# Patient Record
Sex: Female | Born: 1985 | Race: Black or African American | Hispanic: No | Marital: Single | Smoking: Never smoker
Health system: Southern US, Community
[De-identification: ages and names within clinical notes are randomized; demographics above are authoritative.]

## PROBLEM LIST (undated history)

## (undated) DIAGNOSIS — Z8614 Personal history of Methicillin resistant Staphylococcus aureus infection: Secondary | ICD-10-CM

## (undated) DIAGNOSIS — D649 Anemia, unspecified: Secondary | ICD-10-CM

## (undated) DIAGNOSIS — T4145XA Adverse effect of unspecified anesthetic, initial encounter: Secondary | ICD-10-CM

## (undated) DIAGNOSIS — I10 Essential (primary) hypertension: Secondary | ICD-10-CM

## (undated) DIAGNOSIS — T8859XA Other complications of anesthesia, initial encounter: Secondary | ICD-10-CM

## (undated) HISTORY — PX: TONSILLECTOMY: SUR1361

## (undated) HISTORY — DX: Morbid (severe) obesity due to excess calories: E66.01

---
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
## 2004-05-10 ENCOUNTER — Other Ambulatory Visit: Admission: RE | Admit: 2004-05-10 | Discharge: 2004-05-10 | Payer: Self-pay | Admitting: Obstetrics and Gynecology

## 2004-05-11 ENCOUNTER — Other Ambulatory Visit: Admission: RE | Admit: 2004-05-11 | Discharge: 2004-05-11 | Payer: Self-pay | Admitting: Obstetrics and Gynecology

## 2005-10-07 ENCOUNTER — Other Ambulatory Visit: Admission: RE | Admit: 2005-10-07 | Discharge: 2005-10-07 | Payer: Self-pay | Admitting: Obstetrics and Gynecology

## 2006-12-06 ENCOUNTER — Emergency Department (HOSPITAL_COMMUNITY): Admission: EM | Admit: 2006-12-06 | Discharge: 2006-12-06 | Payer: Self-pay | Admitting: Family Medicine

## 2007-03-01 ENCOUNTER — Emergency Department (HOSPITAL_COMMUNITY): Admission: EM | Admit: 2007-03-01 | Discharge: 2007-03-01 | Payer: Self-pay | Admitting: Emergency Medicine

## 2008-03-12 ENCOUNTER — Emergency Department (HOSPITAL_COMMUNITY): Admission: EM | Admit: 2008-03-12 | Discharge: 2008-03-12 | Payer: Self-pay | Admitting: Family Medicine

## 2008-05-21 ENCOUNTER — Emergency Department (HOSPITAL_COMMUNITY): Admission: EM | Admit: 2008-05-21 | Discharge: 2008-05-21 | Payer: Self-pay | Admitting: Family Medicine

## 2008-05-23 ENCOUNTER — Emergency Department (HOSPITAL_COMMUNITY): Admission: EM | Admit: 2008-05-23 | Discharge: 2008-05-23 | Payer: Self-pay | Admitting: Family Medicine

## 2008-05-25 ENCOUNTER — Emergency Department (HOSPITAL_COMMUNITY): Admission: EM | Admit: 2008-05-25 | Discharge: 2008-05-25 | Payer: Self-pay | Admitting: Emergency Medicine

## 2008-06-16 ENCOUNTER — Emergency Department (HOSPITAL_COMMUNITY): Admission: EM | Admit: 2008-06-16 | Discharge: 2008-06-16 | Payer: Self-pay | Admitting: Family Medicine

## 2008-11-29 ENCOUNTER — Emergency Department (HOSPITAL_COMMUNITY): Admission: EM | Admit: 2008-11-29 | Discharge: 2008-11-29 | Payer: Self-pay | Admitting: Emergency Medicine

## 2008-12-01 ENCOUNTER — Emergency Department (HOSPITAL_COMMUNITY): Admission: EM | Admit: 2008-12-01 | Discharge: 2008-12-02 | Payer: Self-pay | Admitting: Emergency Medicine

## 2008-12-24 ENCOUNTER — Emergency Department (HOSPITAL_BASED_OUTPATIENT_CLINIC_OR_DEPARTMENT_OTHER): Admission: EM | Admit: 2008-12-24 | Discharge: 2008-12-24 | Payer: Self-pay | Admitting: Emergency Medicine

## 2010-12-29 LAB — DIFFERENTIAL
Eosinophils Absolute: 0.1 10*3/uL (ref 0.0–0.7)
Lymphocytes Relative: 28 % (ref 12–46)
Lymphs Abs: 2.3 10*3/uL (ref 0.7–4.0)
Neutro Abs: 5 10*3/uL (ref 1.7–7.7)
Neutrophils Relative %: 61 % (ref 43–77)

## 2010-12-29 LAB — BASIC METABOLIC PANEL
BUN: 9 mg/dL (ref 6–23)
Calcium: 8.6 mg/dL (ref 8.4–10.5)
Creatinine, Ser: 0.7 mg/dL (ref 0.4–1.2)
GFR calc non Af Amer: >60 mL/min (ref >60)

## 2010-12-29 LAB — URINALYSIS, ROUTINE W REFLEX MICROSCOPIC
Ketones, ur: NEGATIVE mg/dL
Nitrite: NEGATIVE
Protein, ur: NEGATIVE mg/dL

## 2010-12-29 LAB — CBC
Platelets: 237 10*3/uL (ref 150–400)
WBC: 8.2 10*3/uL (ref 4.0–10.5)

## 2010-12-29 LAB — PREGNANCY, URINE: Preg Test, Ur: NEGATIVE

## 2010-12-30 LAB — GONOCOCCUS CULTURE: Culture: NO GROWTH

## 2010-12-30 LAB — MONONUCLEOSIS SCREEN: Mono Screen: NEGATIVE

## 2011-06-16 LAB — WET PREP, GENITAL: Yeast Wet Prep HPF POC: NONE SEEN

## 2011-06-16 LAB — GC/CHLAMYDIA PROBE AMP, GENITAL: GC Probe Amp, Genital: NEGATIVE

## 2011-06-16 LAB — HIV ANTIBODY (ROUTINE TESTING W REFLEX): HIV: NONREACTIVE

## 2011-06-16 LAB — HERPES SIMPLEX VIRUS CULTURE

## 2011-06-22 LAB — CULTURE, ROUTINE-ABSCESS

## 2012-05-04 ENCOUNTER — Inpatient Hospital Stay (HOSPITAL_COMMUNITY)
Admission: AD | Admit: 2012-05-04 | Discharge: 2012-05-04 | Disposition: A | Discharge status: Home / Self Care | Payer: Medicaid Other | Source: Ambulatory Visit | Attending: Obstetrics & Gynecology | Admitting: Obstetrics & Gynecology

## 2012-05-04 ENCOUNTER — Encounter (HOSPITAL_COMMUNITY): Payer: Self-pay | Admitting: *Deleted

## 2012-05-04 DIAGNOSIS — N912 Amenorrhea, unspecified: Secondary | ICD-10-CM | POA: Insufficient documentation

## 2012-05-04 DIAGNOSIS — I10 Essential (primary) hypertension: Secondary | ICD-10-CM

## 2012-05-04 HISTORY — DX: Anemia, unspecified: D64.9

## 2012-05-04 HISTORY — DX: Adverse effect of unspecified anesthetic, initial encounter: T41.45XA

## 2012-05-04 HISTORY — DX: Other complications of anesthesia, initial encounter: T88.59XA

## 2012-05-04 LAB — WET PREP, GENITAL: WBC, Wet Prep HPF POC: NONE SEEN

## 2012-05-04 LAB — POCT PREGNANCY, URINE: Preg Test, Ur: NEGATIVE

## 2012-05-04 LAB — URINALYSIS, ROUTINE W REFLEX MICROSCOPIC
Bilirubin Urine: NEGATIVE
Glucose, UA: NEGATIVE mg/dL
Hgb urine dipstick: NEGATIVE
Protein, ur: NEGATIVE mg/dL
Urobilinogen, UA: 0.2 mg/dL (ref 0.0–1.0)
pH: 6 (ref 5.0–8.0)

## 2012-05-04 LAB — URINE MICROSCOPIC-ADD ON

## 2012-05-04 MED ORDER — KETOROLAC TROMETHAMINE 60 MG/2ML IM SOLN
60.0000 mg | Freq: Once | INTRAMUSCULAR | Status: AC
  Administered 2012-05-04: 60 mg via INTRAMUSCULAR
  Filled 2012-05-04: qty 2

## 2012-05-04 NOTE — Progress Notes (Signed)
Rectal pain is a throbbing pain with a "squeeze and then a sharp pain"

## 2012-05-04 NOTE — Progress Notes (Signed)
Pt states pain feels like a severe cramp and pt states she has pressure in her vagina

## 2012-05-04 NOTE — MAU Note (Signed)
Pt LMP 08/10/2012, feeling fetal movement.  Never had a pregnancy test.  Pt feels she is 40.[redacted]wks pregnant.  No prenatal care.

## 2012-05-04 NOTE — MAU Note (Signed)
Pt states she has some pain, pt  States she high tolerance for pain, and "according to her App" she is 4 days over due. Pt states she feels movement in her abdomen that other people can also feel.

## 2012-05-04 NOTE — Progress Notes (Signed)
Pt states she is having problem seeing at night

## 2012-05-04 NOTE — MAU Provider Note (Signed)
Chief Complaint:  Possible Pregnancy    First Provider Initiated Contact with Patient 05/04/12 0058      Ashlee Fox is  26 y.o. [id number].  Patient's last menstrual period was 08/11/2011..  She presents complaining of Possible Pregnancy  Pt presents for confirmation of pregnancy. Reports no period since 08/11/2011, "movement" in abd and vaginal and rectal pressure. States that she put information into app on phone that said she was "over her due date".  Obstetrical/Gynecological History: OB History    Grav Para Term Preterm Abortions TAB SAB Ect Mult Living   1 0 0 0 1 0 1 0 0 0       Past Medical History: Past Medical History  Diagnosis Date  . Complication of anesthesia   . Anemia     Past Surgical History: Past Surgical History  Procedure Date  . Tonsillectomy     Family History: No family history on file.  Social History: History  Substance Use Topics  . Smoking status: Not on file  . Smokeless tobacco: Not on file  . Alcohol Use: No    Allergies:  Allergies  Allergen Reactions  . Amoxicillin     Pt states her "breathing is cut off.    No prescriptions prior to admission    Review of Systems - History obtained from the patient General ROS: positive for  - weight gain negative for - chills, fever or weight loss Breast ROS: negative Respiratory ROS: no cough, shortness of breath, or wheezing Cardiovascular ROS: no chest pain or dyspnea on exertion Gastrointestinal ROS: positive for - abdominal pain and change in stools negative for - constipation, diarrhea or nausea/vomiting Genito-Urinary ROS: positive for - change in menstrual cycle and genital discharge, pelvic pain negative for - change in urinary stream, urinary frequency/urgency or vulvar/vaginal symptoms  Physical Exam   Blood pressure 140/83, pulse 92, temperature 97.8 F (36.6 C), temperature source Oral, resp. rate 18, height 5\' 10"  (1.778 m), weight 395 lb 6.4 oz (179.352 kg), last  menstrual period 08/11/2011.  General: General appearance - alert, well appearing, and in no distress, oriented to person, place, and time and comfortable appearing, morbidly obese Mental status - alert, oriented to person, place, and time, normal mood, behavior, speech, dress, motor activity, and thought processes Neck - supple, no significant adenopathy, acanthosis nigricans Lymphatics - no palpable lymphadenopathy Abdomen - morbidly obese, non tender Neurological - alert, oriented, normal speech, no focal findings or movement disorder noted, screening mental status exam normal, cranial nerves II through XII grossly intact Extremities - pedal edema 2 + Focused Gynecological Exam: VULVA: normal appearing vulva with no masses, tenderness or lesions, VAGINA: normal appearing vagina with normal color and discharge, no lesions, CERVIX: normal appearing cervix without discharge or lesions, nulliparous os, UTERUS: unable to appreciate due to patient habitus, non tender, ADNEXA: unable to appreciate due to patient habitus, non tender BL  Labs: Recent Results (from the past 24 hour(s))  URINALYSIS, ROUTINE W REFLEX MICROSCOPIC   Collection Time   05/04/12 12:24 AM      Component Value Range   Color, Urine YELLOW  YELLOW   APPearance HAZY (*) CLEAR   Specific Gravity, Urine 1.025  1.005 - 1.030   pH 6.0  5.0 - 8.0   Glucose, UA NEGATIVE  NEGATIVE mg/dL   Hgb urine dipstick NEGATIVE  NEGATIVE   Bilirubin Urine NEGATIVE  NEGATIVE   Ketones, ur NEGATIVE  NEGATIVE mg/dL   Protein, ur NEGATIVE  NEGATIVE mg/dL  Urobilinogen, UA 0.2  0.0 - 1.0 mg/dL   Nitrite NEGATIVE  NEGATIVE   Leukocytes, UA TRACE (*) NEGATIVE  URINE MICROSCOPIC-ADD ON   Collection Time   05/04/12 12:24 AM      Component Value Range   Squamous Epithelial / LPF MANY (*) RARE   WBC, UA 3-6  <3 WBC/hpf   Bacteria, UA RARE  RARE   Urine-Other MUCOUS PRESENT    POCT PREGNANCY, URINE   Collection Time   05/04/12 12:32 AM       Component Value Range   Preg Test, Ur NEGATIVE  NEGATIVE  WET PREP, GENITAL   Collection Time   05/04/12 12:55 AM      Component Value Range   Yeast Wet Prep HPF POC NONE SEEN  NONE SEEN   Trich, Wet Prep NONE SEEN  NONE SEEN   Clue Cells Wet Prep HPF POC RARE (*) NONE SEEN   WBC, Wet Prep HPF POC NONE SEEN  NONE SEEN    Imaging: Informal US performed at bedside: no pregnancy seen   Assessment: 1. Amenorrhea   2. Morbid obesity   3. Hypertension     Plan: Discharge home Follow up with [facility] or G[facility] Clinic at [facility] for further evaluation.   Blaine Guiffre E. 05/04/2012,1:40 AM

## 2012-05-04 NOTE — Progress Notes (Signed)
Pt states she has to change her underwear because of her discharge. Twice a day

## 2012-05-05 LAB — URINE CULTURE: Colony Count: 95000

## 2012-05-06 NOTE — MAU Provider Note (Signed)
Medical Screening exam and patient care preformed by advanced practice provider.  Agree with the above management.  

## 2012-05-13 ENCOUNTER — Emergency Department (HOSPITAL_COMMUNITY)
Admission: EM | Admit: 2012-05-13 | Discharge: 2012-05-13 | Disposition: A | Discharge status: Home / Self Care | Payer: Self-pay | Attending: Emergency Medicine | Admitting: Emergency Medicine

## 2012-05-13 ENCOUNTER — Encounter (HOSPITAL_COMMUNITY): Payer: Self-pay | Admitting: Emergency Medicine

## 2012-05-13 ENCOUNTER — Emergency Department (INDEPENDENT_AMBULATORY_CARE_PROVIDER_SITE_OTHER)
Admission: EM | Admit: 2012-05-13 | Discharge: 2012-05-13 | Disposition: A | Discharge status: Nursing Home (Includes ICF) | Payer: Self-pay | Source: Home / Self Care | Attending: Emergency Medicine | Admitting: Emergency Medicine

## 2012-05-13 DIAGNOSIS — Z8614 Personal history of Methicillin resistant Staphylococcus aureus infection: Secondary | ICD-10-CM | POA: Insufficient documentation

## 2012-05-13 DIAGNOSIS — L0291 Cutaneous abscess, unspecified: Secondary | ICD-10-CM

## 2012-05-13 DIAGNOSIS — L02213 Cutaneous abscess of chest wall: Secondary | ICD-10-CM

## 2012-05-13 DIAGNOSIS — L02219 Cutaneous abscess of trunk, unspecified: Secondary | ICD-10-CM

## 2012-05-13 DIAGNOSIS — D649 Anemia, unspecified: Secondary | ICD-10-CM | POA: Insufficient documentation

## 2012-05-13 DIAGNOSIS — L03319 Cellulitis of trunk, unspecified: Secondary | ICD-10-CM

## 2012-05-13 HISTORY — DX: Personal history of Methicillin resistant Staphylococcus aureus infection: Z86.14

## 2012-05-13 LAB — CBC WITH DIFFERENTIAL/PLATELET
Basophils Absolute: 0 10*3/uL (ref 0.0–0.1)
Basophils Relative: 0 % (ref 0–1)
Hemoglobin: 11.6 g/dL — ABNORMAL LOW (ref 12.0–15.0)
MCHC: 32 g/dL (ref 30.0–36.0)
Neutro Abs: 5 10*3/uL (ref 1.7–7.7)
Neutrophils Relative %: 53 % (ref 43–77)
RDW: 13.6 % (ref 11.5–15.5)
WBC: 9.6 10*3/uL (ref 4.0–10.5)

## 2012-05-13 MED ORDER — HYDROCODONE-ACETAMINOPHEN 5-500 MG PO TABS: 1.0000 | ORAL_TABLET | Freq: Four times a day (QID) | ORAL | Status: AC | PRN

## 2012-05-13 NOTE — ED Notes (Signed)
Instructed to put on gown 

## 2012-05-13 NOTE — ED Notes (Signed)
Patient does not seem pleased to be transferring. Dr Ladon Applebaum reports explaining situation to patient

## 2012-05-13 NOTE — ED Provider Notes (Addendum)
History  Scribed for Gwyneth Sprout, MD, the patient was seen in room TR08C/TR08C. This chart was scribed by Candelaria Stagers. The patient's care started at 7:08 PM   CSN: [id number]  Arrival date & time 05/13/12  1443   First MD Initiated Contact with Patient 05/13/12 1824      Chief Complaint  Patient presents with  . Abscess     The history is provided by the patient. No language interpreter was used.   Ashlee Fox is a 26 y.o. female who presents to the Emergency Department complaining of an abscess to her right rib cage under her right breast that she noticed several months ago and has now become more swollen and more painful to touch.  She reports that when she first noticed it the abscess drained and now it is not.  She has h/o MRSA and other abscess.  She is experiencing no other sx.  She has applied warm compress and alcohol with no relief.   Past Medical History  Diagnosis Date  . Complication of anesthesia   . Anemia   . Hx MRSA infection     Past Surgical History  Procedure Date  . Tonsillectomy     Family History  Problem Relation Age of Onset  . Diabetes Mother   . Hypertension Mother   . Diabetes Father   . Hypertension Father     History  Substance Use Topics  . Smoking status: Never Smoker   . Smokeless tobacco: Not on file  . Alcohol Use: No    OB History    Grav Para Term Preterm Abortions TAB SAB Ect Mult Living   1 0 0 0 1 0 1 0 0 0       Review of Systems  Skin:       Abscess to right side  All other systems reviewed and are negative.    Allergies  Amoxicillin and Beef-derived products  Home Medications   Current Outpatient Rx  Name Route Sig Dispense Refill  . CETIRIZINE HCL 10 MG PO TABS Oral Take 10 mg by mouth daily.    . IRON PO Oral Take 1 tablet by mouth 2 (two) times daily.    . ADULT MULTIVITAMIN W/MINERALS CH Oral Take 1 tablet by mouth daily.      BP 151/98  Pulse 91  Temp 98.6 F (37 C) (Oral)  Resp 17   SpO2 100%  LMP 08/11/2011  Physical Exam  Nursing note and vitals reviewed. Constitutional: She is oriented to person, place, and time. She appears well-developed and well-nourished. No distress.  HENT:  Head: Normocephalic and atraumatic.  Eyes: EOM are normal. Pupils are equal, round, and reactive to light.  Neck: Normal range of motion.  Pulmonary/Chest: Effort normal. No respiratory distress.  Musculoskeletal: Normal range of motion. She exhibits no edema.  Neurological: She is alert and oriented to person, place, and time.  Skin: Skin is warm and dry. She is not diaphoretic.       Tenderness to palpation of 3x2 cm abscess on right rib cage underneath her right breast.   Psychiatric: She has a normal mood and affect. Her behavior is normal.    ED Course  Procedures   DIAGNOSTIC STUDIES: Oxygen Saturation is 100% on room air, normal by my interpretation.    COORDINATION OF CARE:  15:02 Ordered: CBC with Differential  Labs Reviewed  CBC WITH DIFFERENTIAL - Abnormal; Notable for the following:    Hemoglobin 11.6 (*)  All other components within normal limits   No results found.  INCISION AND DRAINAGE Performed by: Gwyneth Sprout Consent: Verbal consent obtained. Risks and benefits: risks, benefits and alternatives were discussed Type: abscess  Body area: Right chest wall  Anesthesia: local infiltration  Local anesthetic: lidocaine 1 % without epinephrine  Anesthetic total: 7 ml  Complexity: complex Blunt dissection to break up loculations  Drainage: purulent  Drainage amount: 4 ML   Packing material: 1/4 in iodoform gauze  Patient tolerance: Patient tolerated the procedure well with no immediate complications.     1. Abscess       MDM   Patient with evidence of an abscess on her left chest wall that is 2 x 3 cm by ultrasound. Area was I&D. There is no surrounding cellulitis and no need for antibiotics at this time  I personally performed  the services described in this documentation, which was scribed in my presence.  The recorded information has been reviewed and considered.         Gwyneth Sprout, MD 05/13/12 1931  Gwyneth Sprout, MD 05/13/12 Barry Brunner

## 2012-05-13 NOTE — ED Notes (Signed)
Patient reports she wasn't sure she was going to ed.  Reports she cannot afford ed.  Notified dr Ladon Applebaum of patient's concerns

## 2012-05-13 NOTE — ED Notes (Signed)
Tender area beneath right breast, in skin fold.  Tenderness has increased.  Has been using warm compresses.  Reports she noticed a small bump 2 months ago.  History of mrsa.

## 2012-05-13 NOTE — ED Notes (Signed)
Patient is in pain, 10 of of 10 and has been waiting for 3 hours, before that patient was at the urgent care since they opened this morning. Informed that she was the next patient to be placed in to a room.

## 2012-05-13 NOTE — ED Provider Notes (Signed)
History     CSN: [id number]  Arrival date & time 05/13/12  1138   First MD Initiated Contact with Patient 05/13/12 1148      Chief Complaint  Patient presents with  . Abscess    (Consider location/radiation/quality/duration/timing/severity/associated sxs/prior treatment) HPI Comments: Patient presents to urgent care complaining of pain and worsening swelling on her right rib cage underneath her right breast. She had been noticing tenderness and sporadic recurrent swelling for the last 2 months. She has been applying warm compresses for several days. Area has become more swollen and tender, she suspects that she has an infection. But this has not "busted despite her applying warm compresses", she was sent to described above 2 months ago she did have a bump in the same area that she squeezed. She's also concerned that she also has a history of MRSA. Patient denies any fevers, chills or other constitutional symptoms assessment myalgias, arthralgias or changes in appetite.Marland Kitchen)  Patient is a 26 y.o. female presenting with abscess. The history is provided by the patient.  Abscess  This is a new problem. The onset was sudden. Affected Location: Right subcostal. The problem is moderate. The abscess is characterized by swelling. Pertinent negatives include no anorexia and no decrease in physical activity.    Past Medical History  Diagnosis Date  . Complication of anesthesia   . Anemia   . Hx MRSA infection     Past Surgical History  Procedure Date  . Tonsillectomy     Family History  Problem Relation Age of Onset  . Diabetes Mother   . Hypertension Mother   . Diabetes Father   . Hypertension Father     History  Substance Use Topics  . Smoking status: Never Smoker   . Smokeless tobacco: Not on file  . Alcohol Use: No    OB History    Grav Para Term Preterm Abortions TAB SAB Ect Mult Living   1 0 0 0 1 0 1 0 0 0       Review of Systems  Constitutional: Negative for activity  change and appetite change.  Gastrointestinal: Negative for anorexia.  Skin: Negative for rash.    Allergies  Amoxicillin  Home Medications   Current Outpatient Rx  Name Route Sig Dispense Refill  . ASPIRIN 325 MG PO TABS Oral Take 325 mg by mouth daily.      BP 168/99  Pulse 92  Temp 98.7 F (37.1 C) (Oral)  Resp 20  SpO2 99%  LMP 08/11/2011  Physical Exam  Nursing note and vitals reviewed. Constitutional: She appears well-developed and well-nourished.  Pulmonary/Chest: Effort normal.    Neurological: She is alert.  Skin: No erythema.    ED Course  Procedures (including critical care time)  Labs Reviewed - No data to display No results found.   1. Chest wall abscess       MDM  Right chest wall abscess: Area measures about 8 cm, tender palpable suspected deep situated abscess. Patient with body habitus and describing area to be infected for the last 2 months. Perhaps would be beneficial to perform bedside ultrasound to the note the extension of this suspected abscess or to call for a surgery consult.        Jimmie Molly, MD 05/13/12 1352

## 2012-05-13 NOTE — ED Notes (Signed)
Pt c/o abscess to right side; pt sent down for eval from [facility] due to size; pt sts some issues x several months and denies drainage

## 2014-07-21 ENCOUNTER — Encounter (HOSPITAL_COMMUNITY): Payer: Self-pay | Admitting: Emergency Medicine

## 2014-10-27 ENCOUNTER — Emergency Department (INDEPENDENT_AMBULATORY_CARE_PROVIDER_SITE_OTHER)
Admission: EM | Admit: 2014-10-27 | Discharge: 2014-10-27 | Disposition: A | Discharge status: Home / Self Care | Payer: No Typology Code available for payment source | Source: Home / Self Care | Attending: Family Medicine | Admitting: Family Medicine

## 2014-10-27 ENCOUNTER — Encounter (HOSPITAL_COMMUNITY): Payer: Self-pay | Admitting: Emergency Medicine

## 2014-10-27 DIAGNOSIS — M546 Pain in thoracic spine: Secondary | ICD-10-CM

## 2014-10-27 DIAGNOSIS — M549 Dorsalgia, unspecified: Secondary | ICD-10-CM

## 2014-10-27 LAB — POCT URINALYSIS DIP (DEVICE)
Bilirubin Urine: NEGATIVE
Glucose, UA: NEGATIVE mg/dL
Hgb urine dipstick: NEGATIVE
KETONES UR: NEGATIVE mg/dL
NITRITE: NEGATIVE
PROTEIN: NEGATIVE mg/dL
SPECIFIC GRAVITY, URINE: 1.025 (ref 1.005–1.030)
Urobilinogen, UA: 0.2 mg/dL (ref 0.0–1.0)
pH: 6 (ref 5.0–8.0)

## 2014-10-27 MED ORDER — IBUPROFEN 800 MG PO TABS: 800.0000 mg | ORAL_TABLET | Freq: Three times a day (TID) | ORAL | Status: DC

## 2014-10-27 MED ORDER — CYCLOBENZAPRINE HCL 10 MG PO TABS: 10.0000 mg | ORAL_TABLET | Freq: Three times a day (TID) | ORAL | Status: DC | PRN

## 2014-10-27 NOTE — Discharge Instructions (Signed)

## 2014-10-27 NOTE — ED Provider Notes (Signed)
CSN: [id number]     Arrival date & time 10/27/14  1500 History   First MD Initiated Contact with Patient 10/27/14 1633     Chief Complaint  Patient presents with  . Back Pain   (Consider location/radiation/quality/duration/timing/severity/associated sxs/prior Treatment) HPI       29 year old female presents complaining of back pain. She was lifting weights this morning when she had a twinge of pain in her back. This has worsened throughout the day. Now she has pain in her left mid back that radiates up to her neck. It is made worse by sitting still and is somewhat relieved by getting up and moving around and no extremity numbness or weakness. No loss of bowel or bladder control. No urinary symptoms or other systemic symptoms  Past Medical History  Diagnosis Date  . Complication of anesthesia   . Anemia   . Hx MRSA infection    Past Surgical History  Procedure Laterality Date  . Tonsillectomy     Family History  Problem Relation Age of Onset  . Diabetes Mother   . Hypertension Mother   . Diabetes Father   . Hypertension Father    History  Substance Use Topics  . Smoking status: Never Smoker   . Smokeless tobacco: Not on file  . Alcohol Use: No   OB History    Gravida Para Term Preterm AB TAB SAB Ectopic Multiple Living       Review of Systems  Musculoskeletal: Positive for back pain.  All other systems reviewed and are negative.   Allergies  Amoxicillin and Beef-derived products  Home Medications   Prior to Admission medications   Medication Sig Start Date End Date Taking? Authorizing Provider  cetirizine (ZYRTEC) 10 MG tablet Take 10 mg by mouth daily.    Historical Provider, MD  cyclobenzaprine (FLEXERIL) 10 MG tablet Take 1 tablet (10 mg total) by mouth 3 (three) times daily as needed for muscle spasms. 10/27/14   Graylon Good, PA-C  ibuprofen (ADVIL,MOTRIN) 800 MG tablet Take 1 tablet (800 mg total) by mouth 3 (three) times daily. 10/27/14    Graylon Good, PA-C  IRON PO Take 1 tablet by mouth 2 (two) times daily.    Historical Provider, MD  Multiple Vitamin (MULTIVITAMIN WITH MINERALS) TABS Take 1 tablet by mouth daily.    Historical Provider, MD   BP 158/99 mmHg  Pulse 76  Temp(Src) 97.5 F (36.4 C) (Oral)  Resp 16  SpO2 100% Physical Exam  Constitutional: She is oriented to person, place, and time. Vital signs are normal. She appears well-developed and well-nourished. No distress.  Morbidly obese habitus  HENT:  Head: Normocephalic and atraumatic.  Pulmonary/Chest: Effort normal. No respiratory distress.  Musculoskeletal:  Tenderness on the left sided paraspinous musculature, with no midline or bony tenderness. No focal tenderness. This includes the area of her costovertebral angle  Neurological: She is alert and oriented to person, place, and time. She has normal strength. Coordination normal.  Skin: Skin is warm and dry. No rash noted. She is not diaphoretic.  Psychiatric: She has a normal mood and affect. Judgment normal.  Nursing note and vitals reviewed.   ED Course  Procedures (including critical care time) Labs Review Labs Reviewed  POCT URINALYSIS DIP (DEVICE) - Abnormal; Notable for the following:    Leukocytes, UA TRACE (*)    All other components within normal limits  URINE CULTURE    Imaging  Review No results found.   MDM   1. Upper back pain    trace leukocytes, urine culture was sent. Treat with ibuprofen and Flexeril. Follow-up when necessary   Graylon GoodZachary H S[place] Moss, PA-C 10/27/14 1724

## 2014-10-27 NOTE — ED Notes (Signed)
Reports she has been going to the gym to workout since December 2015.  This am tried using weights for upper body, reports feeling a twinge in back and since this has had increasing pain.

## 2014-10-28 LAB — URINE CULTURE
COLONY COUNT: NO GROWTH
Culture: NO GROWTH

## 2017-10-06 ENCOUNTER — Encounter: Payer: Self-pay | Admitting: Family

## 2017-10-06 ENCOUNTER — Ambulatory Visit (INDEPENDENT_AMBULATORY_CARE_PROVIDER_SITE_OTHER): Payer: Medicaid Other | Admitting: Family

## 2017-10-06 VITALS — BP 184/111 | HR 72 | Ht 70.0 in | Wt 378.8 lb

## 2017-10-06 DIAGNOSIS — Z01419 Encounter for gynecological examination (general) (routine) without abnormal findings: Secondary | ICD-10-CM

## 2017-10-06 DIAGNOSIS — R030 Elevated blood-pressure reading, without diagnosis of hypertension: Secondary | ICD-10-CM

## 2017-10-06 DIAGNOSIS — N926 Irregular menstruation, unspecified: Secondary | ICD-10-CM

## 2017-10-06 DIAGNOSIS — Z309 Encounter for contraceptive management, unspecified: Secondary | ICD-10-CM

## 2017-10-06 NOTE — Progress Notes (Signed)
No cycle since July 2018

## 2017-10-08 ENCOUNTER — Encounter: Payer: Self-pay | Admitting: Family

## 2017-10-08 DIAGNOSIS — N926 Irregular menstruation, unspecified: Secondary | ICD-10-CM | POA: Insufficient documentation

## 2017-10-08 DIAGNOSIS — R030 Elevated blood-pressure reading, without diagnosis of hypertension: Secondary | ICD-10-CM | POA: Insufficient documentation

## 2017-10-08 NOTE — Progress Notes (Signed)
Subjective:     Ashlee Fox is a 32 y.o. female here for a routine exam.  Ashlee Fox desires to reestablish care.  Does not recall her last Well Woman Exam.  With a new partner since July 2018.  Pt desires STD screening.  Uncertain regarding pregnancy plan.  Has not used family planning since July.  History of irregular cycles.  Does not recall every having a regular spaced menses,  Occurs every 21 weeks to 6 months.  Bleeding lasts 5-12 days.  History of 3 early trimester miscarriages.  Reports having labs drawn with [facility].  Does not know what was drawn, just all reported as normal.   Current complaints: Occasional vaginal irritation..  Personal health questionnaire reviewed: yes.  Due to elevated blood pressure today inquired about history of elevated blood pressure.  Pt denies having history of elevated blood pressure.  Denies headaches, visual disturbances, or other neuro complaints.     Gynecologic History Patient's last menstrual period was 03/19/2017 (exact date). Contraception: none Last Pap: Does not recall. Results were: normal.  History of abnormal pap at age 41.  +colposcopy.  [facility] provided at [facility].    Obstetric History OB History  Gravida Para Term Preterm AB Living  3 0 0 0 3 0  SAB TAB Ectopic Multiple Live Births  3 0 0 0      # Outcome Date GA Lbr Len/2nd Weight Sex Delivery Anes PTL Lv  3 SAB           2 SAB           1 SAB                The following portions of the patient's history were reviewed and updated as appropriate: allergies, current medications, past family history, past medical history, past social history, past surgical history and problem list.  Review of Systems Pertinent items are noted in HPI.    Objective:   Vitals:   10/06/17 1517  BP: (!) 184/111  Pulse: 72  Weight: (!) 378 lb 12.8 oz (171.8 kg)  Height: 5\' 10"  (1.778 m)   Blood pressure cuff changed to accommodate patients larger arm circumference.   Repeat BP 168/99.    BP (!) 184/111   Pulse 72   Ht 5\' 10"  (1.778 m)   Wt (!) 378 lb 12.8 oz (171.8 kg)   LMP 03/19/2017 (Exact Date)   BMI 54.35 kg/m ; Repeat BP 168/99 General appearance: alert, cooperative and appears stated age Head: Normocephalic, without obvious abnormality, atraumatic Neck: no adenopathy, no carotid bruit, no JVD, supple, symmetrical, trachea midline and thyroid not enlarged, symmetric, no tenderness/mass/nodules Lungs: clear to auscultation bilaterally Breasts: normal appearance, no masses or tenderness, No nipple retraction or dimpling, No nipple discharge or bleeding, No axillary or supraclavicular adenopathy, Normal to palpation without dominant masses, Taught monthly breast self examination Heart: regular rate and rhythm, S1, S2 normal, no murmur, click, rub or gallop Abdomen: soft, non-tender; bowel sounds normal; difficult to assess underlying organs secondary to weight. Pelvic: cervix normal in appearance, external genitalia normal, no adnexal masses or tenderness, no cervical motion tenderness, rectovaginal septum normal, difficult to assess uterus secondary to patient weight.   Skin: Skin color, texture, turgor normal. No rashes or lesions      Assessment:  Well Woman Exam Elevated Blood Pressure Irregular Menstrual Cycle  Plan:  Pap smear collected w/GC/CT Pt will reschedule appt with primary care provider to reassess blood pressure (  pt declines further management at this time - meds, urgent care eval) Pt declines provera challenge to assist in initiation of further family planning at this time.  Will return for additional family planning counseling in two weeks.  Education reviewed: safe sex/STD prevention, self breast exams and incorporating exercise to enhance efforts in weight loss.  Goal:  6500 steps at least 3x/week.     Marlis EdelsonKarim, Walidah N, CNM

## 2017-10-09 ENCOUNTER — Other Ambulatory Visit (HOSPITAL_COMMUNITY)
Admission: RE | Admit: 2017-10-09 | Discharge: 2017-10-09 | Disposition: A | Discharge status: Home / Self Care | Payer: Medicaid Other | Source: Ambulatory Visit | Attending: Family | Admitting: Family

## 2017-10-09 DIAGNOSIS — R87810 Cervical high risk human papillomavirus (HPV) DNA test positive: Secondary | ICD-10-CM | POA: Diagnosis not present

## 2017-10-09 DIAGNOSIS — A749 Chlamydial infection, unspecified: Secondary | ICD-10-CM | POA: Insufficient documentation

## 2017-10-09 DIAGNOSIS — Z01419 Encounter for gynecological examination (general) (routine) without abnormal findings: Secondary | ICD-10-CM | POA: Insufficient documentation

## 2017-10-09 NOTE — Addendum Note (Signed)
Addended by: Marlis EdelsonKARIM, Lillymae Duet N on: 10/09/2017 11:28 AM   Modules accepted: Orders

## 2017-10-09 NOTE — Progress Notes (Signed)
It is done.

## 2017-10-11 ENCOUNTER — Encounter (HOSPITAL_COMMUNITY): Payer: Self-pay

## 2017-10-11 LAB — CYTOLOGY - PAP
ADEQUACY: ABSENT
CANDIDA VAGINITIS: NEGATIVE
Chlamydia: POSITIVE — AB
DIAGNOSIS: NEGATIVE
HPV (WINDOPATH): DETECTED — AB
Neisseria Gonorrhea: NEGATIVE
Trichomonas: NEGATIVE

## 2017-10-18 ENCOUNTER — Other Ambulatory Visit: Payer: Self-pay | Admitting: Family

## 2017-10-18 ENCOUNTER — Encounter: Payer: Self-pay | Admitting: Family

## 2017-10-18 DIAGNOSIS — Z8619 Personal history of other infectious and parasitic diseases: Secondary | ICD-10-CM | POA: Insufficient documentation

## 2017-10-18 DIAGNOSIS — R8789 Other abnormal findings in specimens from female genital organs: Secondary | ICD-10-CM | POA: Insufficient documentation

## 2017-10-18 DIAGNOSIS — A749 Chlamydial infection, unspecified: Secondary | ICD-10-CM | POA: Insufficient documentation

## 2017-10-18 DIAGNOSIS — R87618 Other abnormal cytological findings on specimens from cervix uteri: Secondary | ICD-10-CM | POA: Insufficient documentation

## 2017-10-18 MED ORDER — AZITHROMYCIN 500 MG PO TABS: 1000.0000 mg | ORAL_TABLET | Freq: Once | ORAL | 1 refills | Status: AC

## 2017-10-18 NOTE — Progress Notes (Signed)
Pt notified regarding positive chlamydia and gonorrhea screen. Rx sent to pharmacy.  Informed that all sexual partners will also need to be treated.  Pt verbalizes understanding and agrees with plan of care.  Also explained the HPV results and indication for future testing (annually).

## 2017-10-20 ENCOUNTER — Ambulatory Visit: Payer: No Typology Code available for payment source | Admitting: Advanced Practice Midwife

## 2017-10-27 ENCOUNTER — Other Ambulatory Visit: Payer: Self-pay | Admitting: Obstetrics & Gynecology

## 2017-10-27 ENCOUNTER — Encounter: Payer: Self-pay | Admitting: Obstetrics & Gynecology

## 2017-10-27 ENCOUNTER — Ambulatory Visit (INDEPENDENT_AMBULATORY_CARE_PROVIDER_SITE_OTHER): Payer: Self-pay | Admitting: Obstetrics & Gynecology

## 2017-10-27 DIAGNOSIS — N926 Irregular menstruation, unspecified: Secondary | ICD-10-CM

## 2017-10-27 HISTORY — DX: Morbid (severe) obesity due to excess calories: E66.01

## 2017-10-27 MED ORDER — MEDROXYPROGESTERONE ACETATE 10 MG PO TABS: 10.0000 mg | ORAL_TABLET | Freq: Every day | ORAL | 2 refills | Status: DC

## 2017-10-27 NOTE — Progress Notes (Signed)
   GYNECOLOGY OFFICE VISIT NOTE  History:  31 y.o. G3P0030 here today for discussion about amenorrhea x 7 months.  History of morbid obesity, SAB x 3.  Desires51 laboratory evaluation and imaging. She denies any abnormal vaginal discharge, bleeding, pelvic pain or other concerns.   Past Medical History:  Diagnosis Date  . Anemia   . Complication of anesthesia   . Hx MRSA infection     Past Surgical History:  Procedure Laterality Date  . TONSILLECTOMY      The following portions of the patient's history were reviewed and updated as appropriate: allergies, current medications, past family history, past medical history, past social history, past surgical history and problem list.   Health Maintenance:  Normal pap on 10/09/2017, had annual exam on 10/09/2017.  Review of Systems:  Pertinent items noted in HPI and remainder of comprehensive ROS otherwise negative.  Objective:  Physical Exam AFVSS CONSTITUTIONAL: Well-developed, well-nourished female in no acute distress.  HENT:  Normocephalic, atraumatic. External right and left ear normal. Oropharynx is clear and moist EYES: Conjunctivae and EOM are normal. Pupils are equal, round, and reactive to light. No scleral icterus.  NECK: Normal range of motion, supple, no masses SKIN: Skin is warm and dry. No rash noted. Not diaphoretic. No erythema. No pallor. NEUROLOGIC: Alert and oriented to person, place, and time. Normal reflexes, muscle tone coordination. No cranial nerve deficit noted. PSYCHIATRIC: Normal mood and affect. Normal behavior. Normal judgment and thought content. CARDIOVASCULAR: Normal heart rate noted RESPIRATORY: Effort and breath sounds normal, no problems with respiration noted ABDOMEN: Soft, obese, no distention noted.   PELVIC: Deferred MUSCULOSKELETAL: Normal range of motion. No edema noted.  Labs and Imaging No results found.  Assessment & Plan:  1. Irregular menses 2. Morbid Obesity (HCC) Patient has  amenorrhea x 7 months.  She has a normal exam, no evidence of lesions.  Will order abnormal uterine bleeding evaluation labs and pelvic ultrasound to evaluate for any structural gynecologic abnormalities.    Provera challenge also ordered. - Follicle stimulating hormone - Beta HCG, Quant - Hemoglobin A1c - TSH - Testosterone,Free and Total - Prolactin - US PELVIC COMPLETE WITH TRANSVAGINAL; Future - medroxyPROGESTERone (PROVERA) 10 MG tablet; Take 1 tablet (10 mg total) by mouth daily. Use for ten days  Dispense: 10 tablet; Refill: 2 Will contact patient with these results and plans for further evaluation/management.    Total face-to-face time with patient:  15 minutes.  Over 50% of encounter was spent on counseling and coordination of care.   Jaynie CollinsUGONNA  ANYANWU, MD, FACOG Obstetrician & Gynecologist, [facility], [facility]

## 2017-10-27 NOTE — Patient Instructions (Signed)
Return to clinic for any scheduled appointments or for any gynecologic concerns as needed.   

## 2017-11-01 ENCOUNTER — Encounter: Payer: Self-pay | Admitting: Obstetrics & Gynecology

## 2017-11-01 ENCOUNTER — Encounter: Payer: Self-pay | Admitting: Family

## 2017-11-01 LAB — TESTOSTERONE,FREE AND TOTAL
TESTOSTERONE FREE: 1.1 pg/mL (ref 0.0–4.2)
TESTOSTERONE: 36 ng/dL (ref 8–48)

## 2017-11-01 LAB — HEMOGLOBIN A1C
ESTIMATED AVERAGE GLUCOSE: 100 mg/dL
HEMOGLOBIN A1C: 5.1 % (ref 4.8–5.6)

## 2017-11-01 LAB — FOLLICLE STIMULATING HORMONE: FSH: 3.8 m[IU]/mL

## 2017-11-01 LAB — BETA HCG QUANT (REF LAB): hCG Quant: <1 m[IU]/mL

## 2017-11-01 LAB — PROLACTIN: Prolactin: 12.9 ng/mL (ref 4.8–23.3)

## 2017-11-01 LAB — TSH: TSH: 0.502 u[IU]/mL (ref 0.450–4.500)

## 2017-11-02 ENCOUNTER — Encounter (HOSPITAL_COMMUNITY): Payer: Self-pay | Admitting: Emergency Medicine

## 2017-11-02 ENCOUNTER — Ambulatory Visit (HOSPITAL_COMMUNITY)
Admission: EM | Admit: 2017-11-02 | Discharge: 2017-11-02 | Disposition: A | Discharge status: Home / Self Care | Payer: No Typology Code available for payment source | Attending: Internal Medicine | Admitting: Internal Medicine

## 2017-11-02 ENCOUNTER — Other Ambulatory Visit: Payer: Self-pay

## 2017-11-02 DIAGNOSIS — W19XXXA Unspecified fall, initial encounter: Secondary | ICD-10-CM

## 2017-11-02 DIAGNOSIS — R030 Elevated blood-pressure reading, without diagnosis of hypertension: Secondary | ICD-10-CM

## 2017-11-02 DIAGNOSIS — S76911A Strain of unspecified muscles, fascia and tendons at thigh level, right thigh, initial encounter: Secondary | ICD-10-CM

## 2017-11-02 DIAGNOSIS — T148XXA Other injury of unspecified body region, initial encounter: Secondary | ICD-10-CM

## 2017-11-02 MED ORDER — CYCLOBENZAPRINE HCL 10 MG PO TABS: 5.0000 mg | ORAL_TABLET | Freq: Every evening | ORAL | 0 refills | Status: DC | PRN

## 2017-11-02 MED ORDER — KETOROLAC TROMETHAMINE 60 MG/2ML IM SOLN
INTRAMUSCULAR | Status: AC
  Filled 2017-11-02: qty 2

## 2017-11-02 MED ORDER — MELOXICAM 7.5 MG PO TABS: 7.5000 mg | ORAL_TABLET | Freq: Every day | ORAL | 0 refills | Status: DC

## 2017-11-02 MED ORDER — KETOROLAC TROMETHAMINE 60 MG/2ML IM SOLN
60.0000 mg | Freq: Once | INTRAMUSCULAR | Status: AC
  Administered 2017-11-02: 60 mg via INTRAMUSCULAR

## 2017-11-02 NOTE — Discharge Instructions (Signed)
Start Mobic as directed. Flexeril as needed at night. Flexeril can make you drowsy, so do not take if you are going to drive, operate heavy machinery, or make important decisions. Ice/heat compresses as needed. This can take up to 3-4 weeks to completely resolve, but you should be feeling better each week. Follow up with PCP if symptoms worsen, changes for reevaluation.  Your blood pressure is high on exam today. This could be due to high blood pressure on baseline, with use of cold medicine, and that you are in pain. If experiencing worsening symptoms, headache/blurry vision, chest pain, shortness of breath, weakness, dizziness, passing out, confusion/altered mental status, go to the emergency department for further evaluation.
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
## 2017-11-10 ENCOUNTER — Ambulatory Visit (INDEPENDENT_AMBULATORY_CARE_PROVIDER_SITE_OTHER): Payer: Self-pay | Admitting: Family

## 2017-11-10 ENCOUNTER — Ambulatory Visit (HOSPITAL_COMMUNITY)
Admission: RE | Admit: 2017-11-10 | Discharge: 2017-11-10 | Disposition: A | Discharge status: Home / Self Care | Payer: No Typology Code available for payment source | Source: Ambulatory Visit | Attending: Obstetrics & Gynecology | Admitting: Obstetrics & Gynecology

## 2017-11-10 ENCOUNTER — Encounter: Payer: Self-pay | Admitting: Family

## 2017-11-10 ENCOUNTER — Other Ambulatory Visit (HOSPITAL_COMMUNITY)
Admission: RE | Admit: 2017-11-10 | Discharge: 2017-11-10 | Disposition: A | Discharge status: Home / Self Care | Payer: Medicaid Other | Source: Ambulatory Visit | Attending: Family | Admitting: Family

## 2017-11-10 VITALS — BP 188/123 | HR 108 | Temp 98.0°F | Resp 18 | Wt 383.2 lb

## 2017-11-10 DIAGNOSIS — Z124 Encounter for screening for malignant neoplasm of cervix: Secondary | ICD-10-CM

## 2017-11-10 DIAGNOSIS — R87616 Satisfactory cervical smear but lacking transformation zone: Secondary | ICD-10-CM | POA: Insufficient documentation

## 2017-11-10 DIAGNOSIS — N926 Irregular menstruation, unspecified: Secondary | ICD-10-CM | POA: Insufficient documentation

## 2017-11-10 DIAGNOSIS — R935 Abnormal findings on diagnostic imaging of other abdominal regions, including retroperitoneum: Secondary | ICD-10-CM | POA: Insufficient documentation

## 2017-11-10 DIAGNOSIS — Z1151 Encounter for screening for human papillomavirus (HPV): Secondary | ICD-10-CM | POA: Insufficient documentation

## 2017-11-10 MED ORDER — LABETALOL HCL 200 MG PO TABS: 200.0000 mg | ORAL_TABLET | Freq: Once | ORAL | 1 refills | Status: DC

## 2017-11-10 MED ORDER — MEDROXYPROGESTERONE ACETATE 10 MG PO TABS: 10.0000 mg | ORAL_TABLET | Freq: Every day | ORAL | 2 refills | Status: DC

## 2017-11-11 NOTE — Progress Notes (Signed)
Patient ID: Ashlee Fox, female   DOB: 11/10/1985, 32 y.o.   MRN: [id number]  S:  Pt here for repap due to lack of transformation zone on previous pap smear. O:   Vitals:   11/10/17 1541  BP: (!) 188/123  Pulse: (!) 108  Resp: 18  Temp: 98 F (36.7 C)  SpO2: 95%   Physical Exam  Constitutional: She appears well-developed and well-nourished. No distress.  HENT:  Head: Normocephalic.  Neck: Normal range of motion. Neck supple.  Cervix:  Able to place cytobrush within cervical os  A: Pap smear Elevated blood pressure  P: Discussed with patient the importance of follow-up for elevated blood pressure; asymptomatic at visit Agrees to begin antihypertensive until follow-up with PCP RX Labetalol 200 mg QD (agrees to only once a day dosing and a medication safe if becomes pregnant due to not being on birth control) Elenora FenderKarim, Ashlee Fox, CNM

## 2017-11-14 LAB — CYTOLOGY - PAP
DIAGNOSIS: NEGATIVE
HPV: DETECTED — AB

## 2017-11-15 ENCOUNTER — Encounter: Payer: Self-pay | Admitting: Family

## 2017-11-21 ENCOUNTER — Encounter: Payer: Self-pay | Admitting: Family

## 2017-11-25 ENCOUNTER — Encounter: Payer: Self-pay | Admitting: Family

## 2017-11-25 ENCOUNTER — Telehealth: Payer: Self-pay | Admitting: Family

## 2017-11-25 MED ORDER — HYDROCHLOROTHIAZIDE 25 MG PO TABS: 25.0000 mg | ORAL_TABLET | Freq: Every day | ORAL | 3 refills | Status: DC

## 2017-11-25 NOTE — Telephone Encounter (Signed)
Pt reports increased tingling on arms and hands.  Reviewed that that's a common side effect of labetalol.  Pt desires to discontinue that medication.  Will start HCTZ 25 daily.  Currently on menstrual cycle.

## 2017-12-05 ENCOUNTER — Encounter: Payer: Self-pay | Admitting: Family

## 2017-12-06 ENCOUNTER — Telehealth: Payer: Self-pay | Admitting: Family

## 2017-12-06 NOTE — Telephone Encounter (Signed)
[facility] [place] [place]; also notified that recommended to go to ER for blood pressure elevated to the degree that it has been elevated 200/100's and reducing blood pressure essential to her well-being and health.

## 2018-02-13 ENCOUNTER — Encounter: Payer: Self-pay | Admitting: Family

## 2018-03-18 ENCOUNTER — Encounter: Payer: Self-pay | Admitting: Family

## 2018-03-30 ENCOUNTER — Encounter: Payer: Self-pay | Admitting: Family

## 2018-03-30 ENCOUNTER — Ambulatory Visit (INDEPENDENT_AMBULATORY_CARE_PROVIDER_SITE_OTHER): Payer: Self-pay | Admitting: Family

## 2018-03-30 VITALS — BP 149/101 | HR 91 | Resp 16 | Ht 70.0 in | Wt 373.2 lb

## 2018-03-30 DIAGNOSIS — I10 Essential (primary) hypertension: Secondary | ICD-10-CM

## 2018-03-30 DIAGNOSIS — N912 Amenorrhea, unspecified: Secondary | ICD-10-CM

## 2018-03-30 DIAGNOSIS — Z3200 Encounter for pregnancy test, result unknown: Secondary | ICD-10-CM

## 2018-03-30 MED ORDER — HYDROCHLOROTHIAZIDE 25 MG PO TABS: 25.0000 mg | ORAL_TABLET | Freq: Every day | ORAL | 10 refills | Status: DC

## 2018-03-30 NOTE — Progress Notes (Signed)
History:  [name] is a 32 y.o. G3P0030 who presents to clinic today for missed menses. Patient's last menstrual period was 01/21/2018.  Cycles became regular once blood pressure lowered.   Desires to confirm pregnancy with blood test.    The following portions of the patient's history were reviewed and updated as appropriate: allergies, current medications, family history, past medical history, social history, past surgical history and problem list.  Review of Systems:  Review of Systems  Constitutional: Negative for activity change and appetite change.  Eyes: Negative for photophobia and visual disturbance.  Gastrointestinal: Negative for nausea and vomiting.  Genitourinary: Negative for pelvic pain (intermittent "occasionally") and vaginal bleeding.  Musculoskeletal:       Nipple sensitivity  Neurological: Negative for light-headedness and headaches.       Objective:  Physical Exam BP (!) 149/101 (BP Location: Left Wrist, Cuff Size: Large)   Pulse 91   Resp 16   Ht 5\' 10"  (1.778 m)   Wt (!) 373 lb 3.2 oz (169.3 kg)   LMP 01/21/2018   BMI 53.55 kg/m  Physical Exam  Constitutional: She is oriented to person, place, and time. She appears well-developed and well-nourished. No distress.  HENT:  Head: Normocephalic and atraumatic.  Neck: Normal range of motion. Neck supple. No thyromegaly present.  Neurological: She is alert and oriented to person, place, and time.  Skin: Skin is warm and dry.     Labs and Imaging No results found for this or any previous visit (from the past 24 hour(s)).  No results found.  Urine HCG neg Assessment & Plan:  1. Possible pregnancy, not yet confirmed - POCT urine pregnancy - Beta HCG  Follow-up prn  Ashlee Fox, Ashlee N, CNM 03/30/2018 11:55 AM

## 2018-03-31 LAB — BETA HCG QUANT (REF LAB)

## 2018-04-20 ENCOUNTER — Telehealth: Payer: Self-pay | Admitting: *Deleted

## 2018-04-20 NOTE — Telephone Encounter (Signed)
Patient left a voice message stating that should have been medication sent in for blood pressure and Metformin. She also want to know if medication would be sent to start her cycle. Please give her a call at [phone].  Clovis PuMartin, Timur Nibert L, RN

## 2018-05-04 ENCOUNTER — Other Ambulatory Visit: Payer: Self-pay | Admitting: Family

## 2018-05-04 ENCOUNTER — Telehealth: Payer: Self-pay | Admitting: *Deleted

## 2018-05-04 DIAGNOSIS — N914 Secondary oligomenorrhea: Secondary | ICD-10-CM

## 2018-05-04 DIAGNOSIS — I10 Essential (primary) hypertension: Secondary | ICD-10-CM

## 2018-05-04 MED ORDER — METFORMIN HCL 500 MG PO TABS: 500.0000 mg | ORAL_TABLET | Freq: Three times a day (TID) | ORAL | 4 refills | Status: DC

## 2018-05-04 MED ORDER — MEDROXYPROGESTERONE ACETATE 10 MG PO TABS
10.0000 mg | ORAL_TABLET | Freq: Every day | ORAL | 2 refills | Status: DC
Start: 2018-05-04 — End: 2020-07-23

## 2018-05-04 MED ORDER — HYDROCHLOROTHIAZIDE 25 MG PO TABS: 25.0000 mg | ORAL_TABLET | Freq: Every day | ORAL | 10 refills | Status: DC

## 2018-05-04 NOTE — Telephone Encounter (Signed)
Pt called stating that she was suppose to receive BP med, Metformin and something to start her cycle. BP med sent to [facility]. Will forward chart to provider to review for other concerns.  Clovis PuMartin, Tamika L, RN

## 2018-05-04 NOTE — Progress Notes (Signed)
Pt with longstanding history of secondary amenorrhea.  RX sent in for Metformin 500 mg TID and Provera 10 mg x 10 days.

## 2018-05-04 NOTE — Telephone Encounter (Signed)
Pt is aware that medication was sent to her pharmacy by Rochele PagesWalidah Karim, CNM. Pt advised to take a pregnancy test before taking medication. Pt stated she has not been sexually active and not pregnant. Advised patient to schedule a follow up visit. Clovis PuMartin, Tamika L, RN

## 2019-01-08 ENCOUNTER — Other Ambulatory Visit: Payer: Self-pay | Admitting: Family

## 2019-01-08 DIAGNOSIS — N914 Secondary oligomenorrhea: Secondary | ICD-10-CM

## 2019-01-08 IMAGING — US US PELVIS COMPLETE TRANSABD/TRANSVAG
1 series · 15 of 25 positions shown · non-contrast
Comparison: None

CLINICAL DATA: Amenorrhea, irregular menses



[Series 1: us pelvis complete transabd/transvag · 15 of 56 slices shown]
[im 1/56]
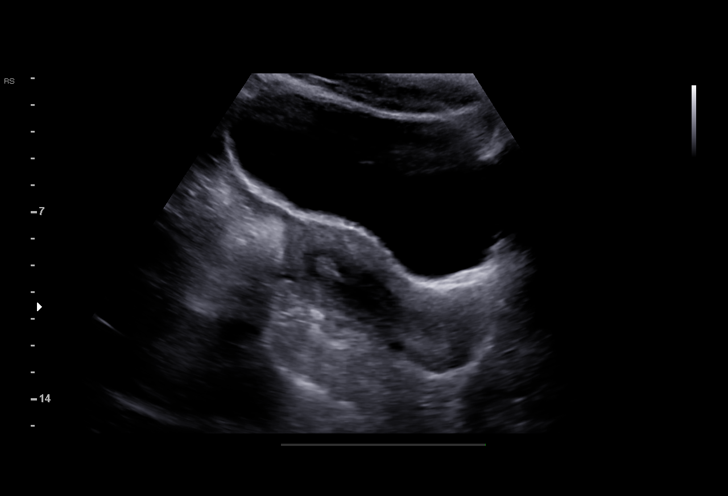
[im 5/56]
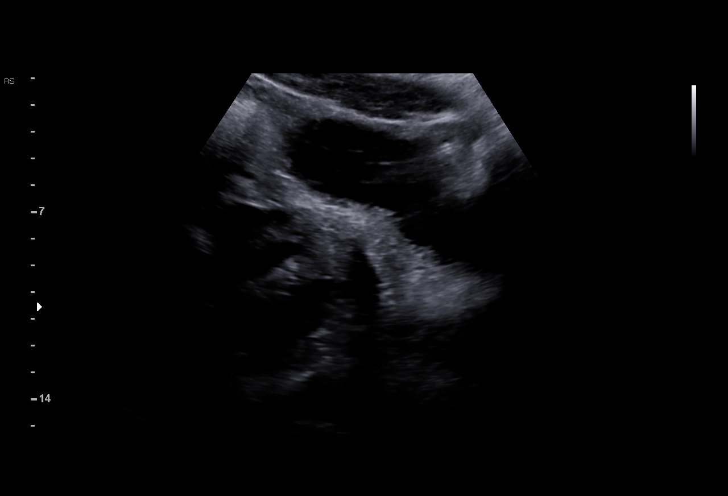
[im 10/56]
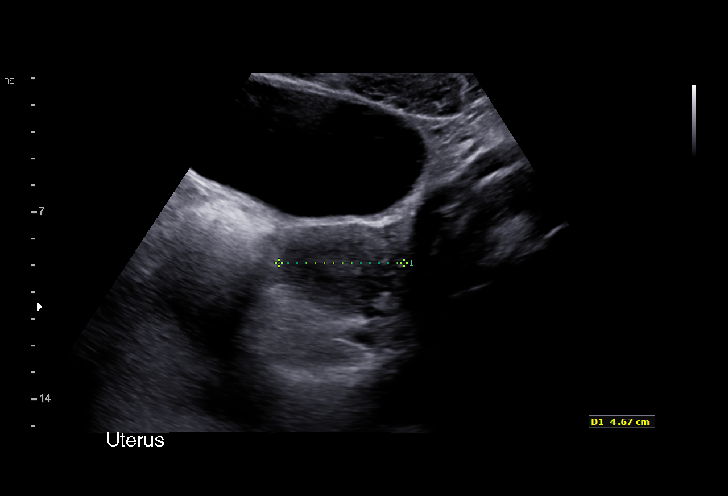
[im 12/56]
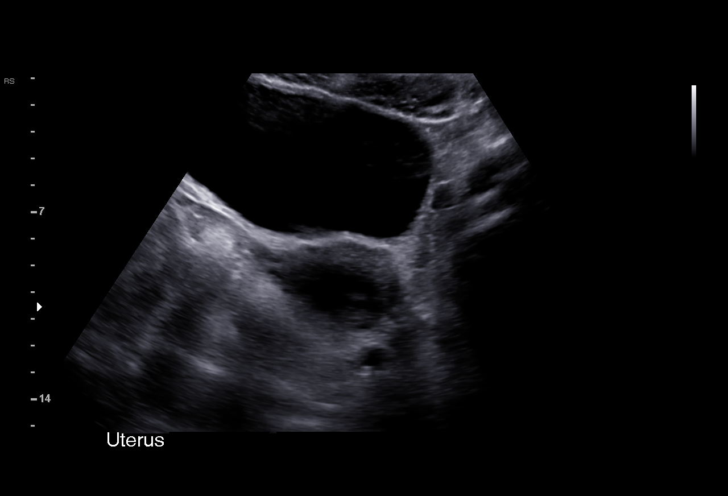
[im 17/56]
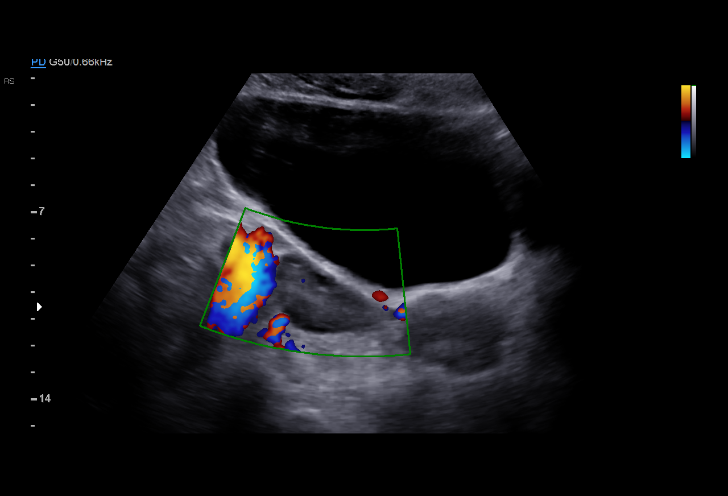
[im 21/56]
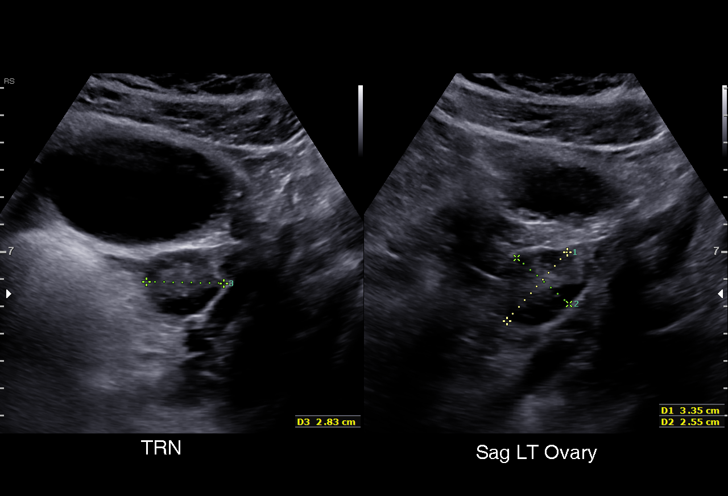
[im 23/56]
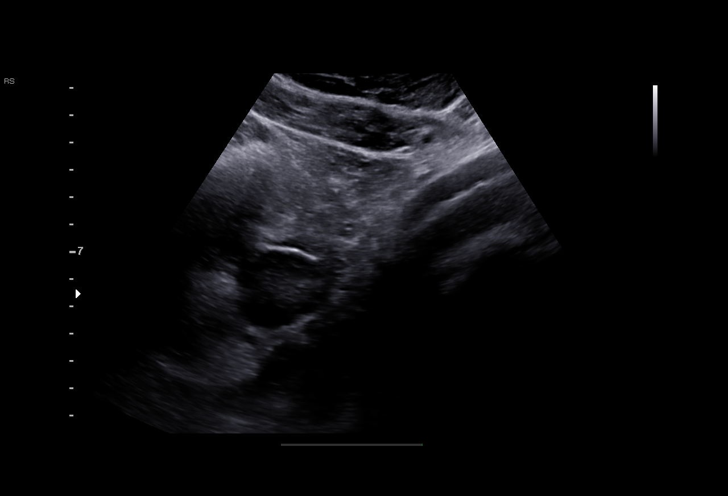
[im 28/56]
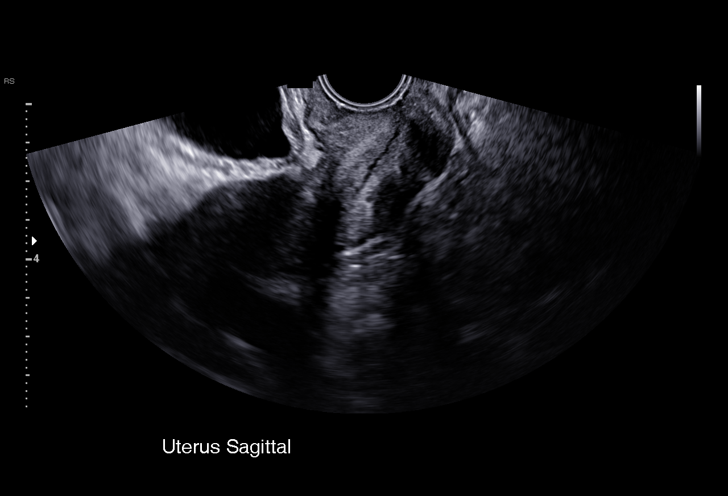
[im 33/56]
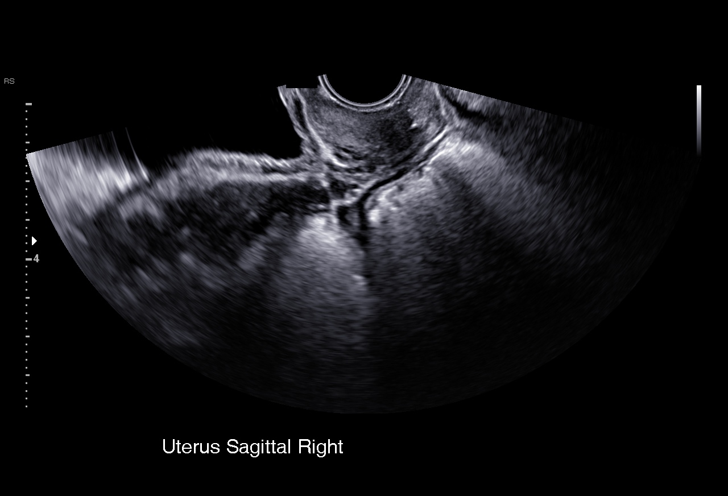
[im 35/56]
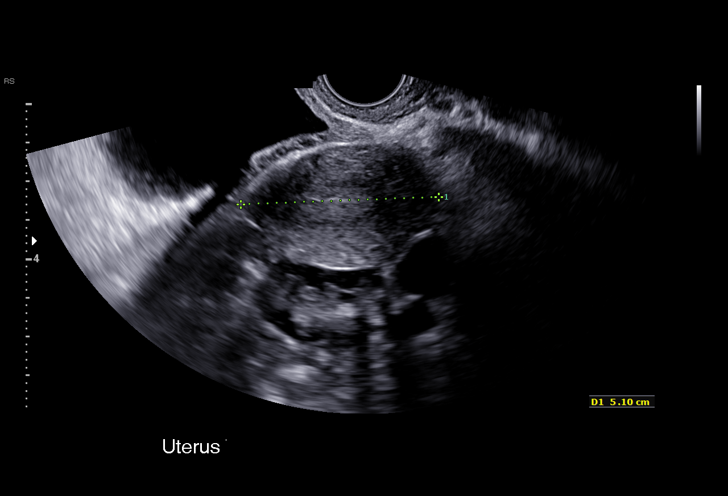
[im 39/56]
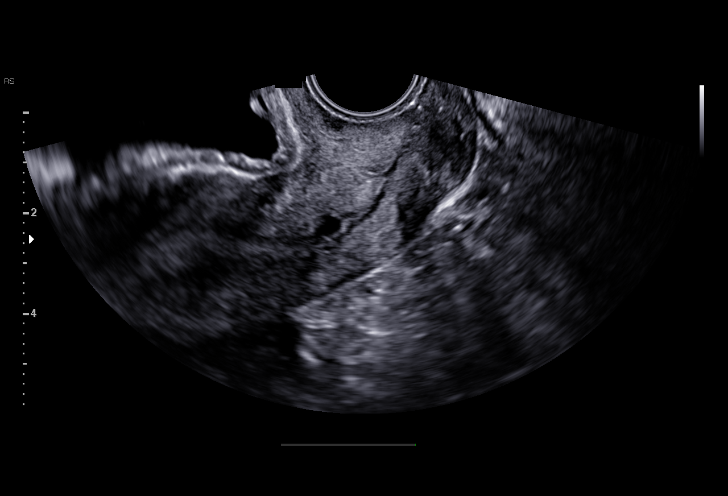
[im 44/56]
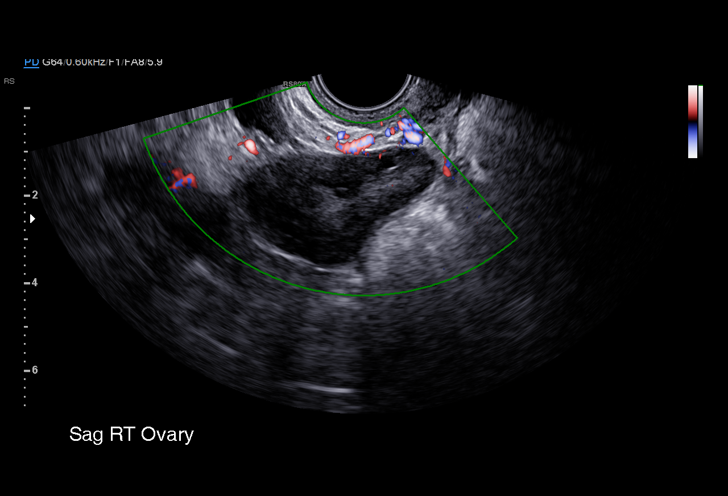
[im 46/56]
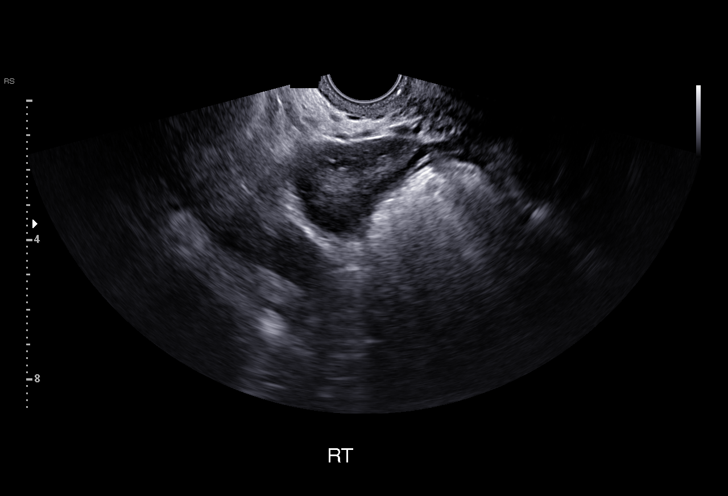
[im 51/56]
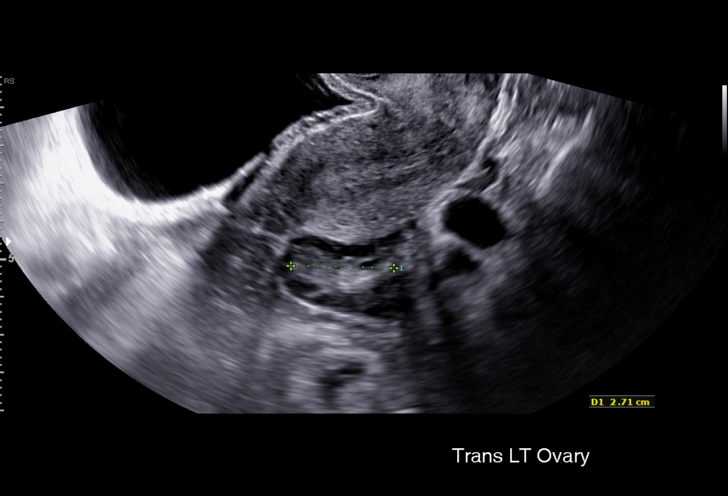
[im 56/56]
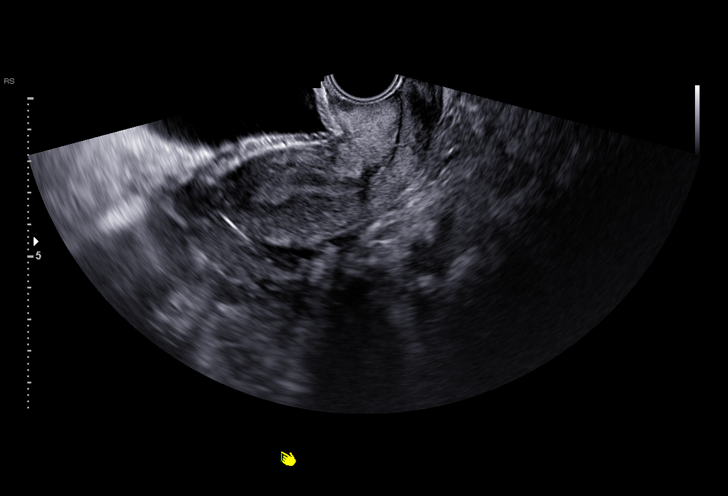

[15 of 25 positions shown; findings below may reference images not displayed]

FINDINGS: Uterus

Measurements: 8.5 x 3.5 x 5.1 cm. No fibroids or other mass
visualized.

Endometrium

Thickness: 8 mm in thickness.  No focal abnormality visualized.

Right ovary

Measurements: 3.8 x 2.9 x 3.6 cm. Normal appearance/no adnexal mass.

Left ovary

Measurements: 4.8 x 3.0 x 3.3 cm. Small echogenic area noted within
the left ovary measures up to 2.7 cm and may reflect a small
dermoid.

Other findings

No abnormal free fluid.
IMPRESSION: Suspect small dermoid within the left ovary, 2.7 cm.

Otherwise unremarkable study.

## 2019-01-14 ENCOUNTER — Other Ambulatory Visit: Payer: Self-pay | Admitting: *Deleted

## 2019-01-14 DIAGNOSIS — N914 Secondary oligomenorrhea: Secondary | ICD-10-CM

## 2019-01-16 MED ORDER — METFORMIN HCL 500 MG PO TABS: 500.0000 mg | ORAL_TABLET | Freq: Three times a day (TID) | ORAL | 4 refills | Status: DC

## 2019-02-14 ENCOUNTER — Telehealth: Payer: Self-pay | Admitting: General Practice

## 2019-02-14 NOTE — Telephone Encounter (Signed)
Patient notified of appointment scheduled with [facility] on 03/14/2019 at 8:50am.  Patient verbalized understanding.

## 2019-03-14 ENCOUNTER — Other Ambulatory Visit: Payer: Self-pay

## 2019-03-14 ENCOUNTER — Encounter: Payer: Self-pay | Admitting: Family Medicine

## 2019-03-14 ENCOUNTER — Ambulatory Visit: Payer: Self-pay | Attending: Family Medicine | Admitting: Family Medicine

## 2019-03-14 VITALS — BP 167/93 | HR 83 | Temp 98.4°F | Ht 70.0 in | Wt 383.6 lb

## 2019-03-14 DIAGNOSIS — Z6841 Body Mass Index (BMI) 40.0 and over, adult: Secondary | ICD-10-CM

## 2019-03-14 DIAGNOSIS — N914 Secondary oligomenorrhea: Secondary | ICD-10-CM

## 2019-03-14 DIAGNOSIS — I10 Essential (primary) hypertension: Secondary | ICD-10-CM

## 2019-03-14 DIAGNOSIS — N926 Irregular menstruation, unspecified: Secondary | ICD-10-CM

## 2019-03-14 LAB — POCT GLYCOSYLATED HEMOGLOBIN (HGB A1C): Hemoglobin A1C: 5.3 % (ref 4.0–5.6)

## 2019-03-14 MED ORDER — METFORMIN HCL 500 MG PO TABS: 500.0000 mg | ORAL_TABLET | Freq: Three times a day (TID) | ORAL | 4 refills | Status: DC

## 2019-03-14 MED ORDER — HYDROCHLOROTHIAZIDE 25 MG PO TABS: 25.0000 mg | ORAL_TABLET | Freq: Every day | ORAL | 4 refills | Status: DC

## 2019-03-14 NOTE — Progress Notes (Signed)
Subjective:  Patient ID: Ashlee Fox, female    DOB: 07/22/1986  Age: 33 y.o. MRN: [id number]  CC: New Patient (Initial Visit) and Hypertension   HPI Ashlee Rogersrica T Hegwood presents to establish as a new patient. She reports that she has not taken her blood pressure medication this morning because it was not at the pharmacy when she went to pick it up this week. She reports that she only wants to use things that are natural therefore she will only take HCTZ to help control her blood pressure. She reports that she is also taking metformin under the suggestion of her midwife to help with fertility. Patient denies any history of diabetes and has had no difficulty getting pregnant but has had miscarriages. She also reports that she tends to get boils on her skin especially on her back, underarms and under her breasts.  She would like to know of a natural remedy to help with the boil formation.  Patient knows that she also is obese and is trying to make dietary changes but admits that she does not exercise on a regular basis.  She denies any current increased thirst or urinary frequency.  No blurred vision or numbness in the hands or feet.  She has had no chest pain or palpitations, no shortness of breath or cough.  No recent fever or chills  Past Medical History:  Diagnosis Date  . Anemia   . Complication of anesthesia   . Hx MRSA infection   . Morbid obesity (HCC) 10/27/2017    Past Surgical History:  Procedure Laterality Date  . TONSILLECTOMY      Family History  Problem Relation Age of Onset  . Diabetes Maternal Grandmother   . Hypertension Maternal Grandmother   . Breast cancer Maternal Uncle     Social History   Tobacco Use  . Smoking status: Never Smoker  . Smokeless tobacco: Never Used  Substance Use Topics  . Alcohol use: No    ROS Review of Systems  Constitutional: Positive for fatigue. Negative for chills and fever.  HENT: Negative for sore throat and trouble swallowing.    Eyes: Negative for photophobia and visual disturbance.  Respiratory: Negative for cough and shortness of breath.   Cardiovascular: Negative for chest pain, palpitations and leg swelling.  Gastrointestinal: Negative for abdominal pain, constipation, diarrhea and nausea.  Endocrine: Negative for polydipsia, polyphagia and polyuria.  Genitourinary: Negative for dysuria and frequency.  Musculoskeletal: Negative for arthralgias and back pain.  Neurological: Negative for dizziness and headaches.  Hematological: Negative for adenopathy. Does not bruise/bleed easily.  Psychiatric/Behavioral: Negative for self-injury and suicidal ideas.    Objective:   Today's Vitals: BP (!) 167/93 (BP Location: Right Arm, Patient Position: Sitting, Cuff Size: Large)   Pulse 83   Temp 98.4 F (36.9 C) (Oral)   Ht 5\' 10"  (1.778 m)   Wt (!) 383 lb 9.6 oz (174 kg)   LMP 01/18/2019 (Exact Date)   SpO2 99%   BMI 55.04 kg/m   Physical Exam Vitals signs and nursing note reviewed.  Constitutional:      Appearance: Normal appearance. She is obese.     Comments: Morbidly obese female in NAD sitting on the exam table  Neck:     Musculoskeletal: Normal range of motion and neck supple. No muscular tenderness.     Vascular: No carotid bruit.     Comments: No palpable thyroid enlargement but patient with redundant fatty tissue in the neck area Cardiovascular:  Rate and Rhythm: Normal rate and regular rhythm.  Pulmonary:     Effort: Pulmonary effort is normal. No respiratory distress.     Breath sounds: Normal breath sounds. No rhonchi.  Abdominal:     Palpations: Abdomen is soft.     Tenderness: There is no abdominal tenderness. There is no right CVA tenderness, left CVA tenderness, guarding or rebound.     Hernia: No hernia is present.     Comments: Truncal obesity  Musculoskeletal:     Right lower leg: No edema.     Left lower leg: No edema.  Lymphadenopathy:     Cervical: No cervical adenopathy.   Skin:    General: Skin is warm and dry.  Neurological:     General: No focal deficit present.     Mental Status: She is alert and oriented to person, place, and time.  Psychiatric:        Mood and Affect: Mood normal.        Behavior: Behavior normal.     Assessment & Plan:  1. Essential hypertension She reports that she will only take hydrochlorothiazide for treatment of hypertension.  Patient was given information on DASH diet as part of her after visit summary and weight loss and regular cardiovascular exercise encouraged to help with blood pressure.  She will resume the daily use of hydrochlorothiazide 25 mg.  2. Irregular menses; 4. Secondary oligomenorrhea Patient with complaint of irregular menses for which she is currently on metformin.  She was provided with refills of metformin at today's visit.  Patient also requested hemoglobin A1c.  Patient encouraged to follow-up with GYN as needed. - HgB A1c  3. Morbid obesity with BMI of 50.0-59.9, adult [facility]) Patient with orbit obesity.  She is at increased risk of diabetes however is currently taking metformin for possible PCOS/irregular menses.  Hemoglobin A1c at today's visit was normal.  She is encouraged to continue efforts at weight loss through dietary changes and regular exercise. - HgB A1c   Outpatient Encounter Medications as of 03/14/2019  Medication Sig  . cholecalciferol (VITAMIN D) 1000 units tablet Take 1 capsule by mouth daily.  . hydrochlorothiazide (HYDRODIURIL) 25 MG tablet Take 1 tablet (25 mg total) by mouth daily.  . IRON PO Take 1 tablet by mouth 2 (two) times daily.  . medroxyPROGESTERone (PROVERA) 10 MG tablet Take 1 tablet (10 mg total) by mouth daily. Use for ten days  . metFORMIN (GLUCOPHAGE) 500 MG tablet Take 1 tablet (500 mg total) by mouth 3 (three) times daily.  . Multiple Vitamin (MULTIVITAMIN WITH MINERALS) TABS Take 1 tablet by mouth daily.   No facility-administered encounter medications on file  as of 03/14/2019.     An After Visit Summary was printed and given to the patient.  Follow-up: Return in about 4 weeks (around 04/11/2019) for blood pressure follow-up.    Antony Blackbird MD

## 2019-03-14 NOTE — Patient Instructions (Signed)
DASH Eating Plan  DASH stands for "Dietary Approaches to Stop Hypertension." The DASH eating plan is a healthy eating plan that has been shown to reduce high blood pressure (hypertension). It may also reduce your risk for type 2 diabetes, heart disease, and stroke. The DASH eating plan may also help with weight loss.  What are tips for following this plan?    General guidelines   Avoid eating more than 2,300 mg (milligrams) of salt (sodium) a day. If you have hypertension, you may need to reduce your sodium intake to 1,500 mg a day.   Limit alcohol intake to no more than 1 drink a day for nonpregnant women and 2 drinks a day for men. One drink equals 12 oz of beer, 5 oz of wine, or 1 oz of hard liquor.   Work with your health care provider to maintain a healthy body weight or to lose weight. Ask what an ideal weight is for you.   Get at least 30 minutes of exercise that causes your heart to beat faster (aerobic exercise) most days of the week. Activities may include walking, swimming, or biking.   Work with your health care provider or diet and nutrition specialist (dietitian) to adjust your eating plan to your individual calorie needs.  Reading food labels     Check food labels for the amount of sodium per serving. Choose foods with less than 5 percent of the Daily Value of sodium. Generally, foods with less than 300 mg of sodium per serving fit into this eating plan.   To find whole grains, look for the word "whole" as the first word in the ingredient list.  Shopping   Buy products labeled as "low-sodium" or "no salt added."   Buy fresh foods. Avoid canned foods and premade or frozen meals.  Cooking   Avoid adding salt when cooking. Use salt-free seasonings or herbs instead of table salt or sea salt. Check with your health care provider or pharmacist before using salt substitutes.   Do not fry foods. Cook foods using healthy methods such as baking, boiling, grilling, and broiling instead.   Cook with  heart-healthy oils, such as olive, canola, soybean, or sunflower oil.  Meal planning   Eat a balanced diet that includes:  ? 5 or more servings of fruits and vegetables each day. At each meal, try to fill half of your plate with fruits and vegetables.  ? Up to 6-8 servings of whole grains each day.  ? Less than 6 oz of lean meat, poultry, or fish each day. A 3-oz serving of meat is about the same size as a deck of cards. One egg equals 1 oz.  ? 2 servings of low-fat dairy each day.  ? A serving of nuts, seeds, or beans 5 times each week.  ? Heart-healthy fats. Healthy fats called Omega-3 fatty acids are found in foods such as flaxseeds and coldwater fish, like sardines, salmon, and mackerel.   Limit how much you eat of the following:  ? Canned or prepackaged foods.  ? Food that is high in trans fat, such as fried foods.  ? Food that is high in saturated fat, such as fatty meat.  ? Sweets, desserts, sugary drinks, and other foods with added sugar.  ? Full-fat dairy products.   Do not salt foods before eating.   Try to eat at least 2 vegetarian meals each week.   Eat more home-cooked food and less restaurant, buffet, and fast food.     When eating at a restaurant, ask that your food be prepared with less salt or no salt, if possible.  What foods are recommended?  The items listed may not be a complete list. Talk with your dietitian about what dietary choices are best for you.  Grains  Whole-grain or whole-wheat bread. Whole-grain or whole-wheat pasta. Brown rice. Oatmeal. Quinoa. Bulgur. Whole-grain and low-sodium cereals. Pita bread. Low-fat, low-sodium crackers. Whole-wheat flour tortillas.  Vegetables  Fresh or frozen vegetables (raw, steamed, roasted, or grilled). Low-sodium or reduced-sodium tomato and vegetable juice. Low-sodium or reduced-sodium tomato sauce and tomato paste. Low-sodium or reduced-sodium canned vegetables.  Fruits  All fresh, dried, or frozen fruit. Canned fruit in natural juice (without  added sugar).  Meat and other protein foods  Skinless chicken or turkey. Ground chicken or turkey. Pork with fat trimmed off. Fish and seafood. Egg whites. Dried beans, peas, or lentils. Unsalted nuts, nut butters, and seeds. Unsalted canned beans. Lean cuts of beef with fat trimmed off. Low-sodium, lean deli meat.  Dairy  Low-fat (1%) or fat-free (skim) milk. Fat-free, low-fat, or reduced-fat cheeses. Nonfat, low-sodium ricotta or cottage cheese. Low-fat or nonfat yogurt. Low-fat, low-sodium cheese.  Fats and oils  Soft margarine without trans fats. Vegetable oil. Low-fat, reduced-fat, or light mayonnaise and salad dressings (reduced-sodium). Canola, safflower, olive, soybean, and sunflower oils. Avocado.  Seasoning and other foods  Herbs. Spices. Seasoning mixes without salt. Unsalted popcorn and pretzels. Fat-free sweets.  What foods are not recommended?  The items listed may not be a complete list. Talk with your dietitian about what dietary choices are best for you.  Grains  Baked goods made with fat, such as croissants, muffins, or some breads. Dry pasta or rice meal packs.  Vegetables  Creamed or fried vegetables. Vegetables in a cheese sauce. Regular canned vegetables (not low-sodium or reduced-sodium). Regular canned tomato sauce and paste (not low-sodium or reduced-sodium). Regular tomato and vegetable juice (not low-sodium or reduced-sodium). Pickles. Olives.  Fruits  Canned fruit in a light or heavy syrup. Fried fruit. Fruit in cream or butter sauce.  Meat and other protein foods  Fatty cuts of meat. Ribs. Fried meat. Bacon. Sausage. Bologna and other processed lunch meats. Salami. Fatback. Hotdogs. Bratwurst. Salted nuts and seeds. Canned beans with added salt. Canned or smoked fish. Whole eggs or egg yolks. Chicken or turkey with skin.  Dairy  Whole or 2% milk, cream, and half-and-half. Whole or full-fat cream cheese. Whole-fat or sweetened yogurt. Full-fat cheese. Nondairy creamers. Whipped toppings.  Processed cheese and cheese spreads.  Fats and oils  Butter. Stick margarine. Lard. Shortening. Ghee. Bacon fat. Tropical oils, such as coconut, palm kernel, or palm oil.  Seasoning and other foods  Salted popcorn and pretzels. Onion salt, garlic salt, seasoned salt, table salt, and sea salt. Worcestershire sauce. Tartar sauce. Barbecue sauce. Teriyaki sauce. Soy sauce, including reduced-sodium. Steak sauce. Canned and packaged gravies. Fish sauce. Oyster sauce. Cocktail sauce. Horseradish that you find on the shelf. Ketchup. Mustard. Meat flavorings and tenderizers. Bouillon cubes. Hot sauce and Tabasco sauce. Premade or packaged marinades. Premade or packaged taco seasonings. Relishes. Regular salad dressings.  Where to find more information:   National Heart, Lung, and Blood Institute: www.nhlbi.nih.gov   American Heart Association: www.heart.org  Summary   The DASH eating plan is a healthy eating plan that has been shown to reduce high blood pressure (hypertension). It may also reduce your risk for type 2 diabetes, heart disease, and stroke.   With the   DASH eating plan, you should limit salt (sodium) intake to 2,300 mg a day. If you have hypertension, you may need to reduce your sodium intake to 1,500 mg a day.   When on the DASH eating plan, aim to eat more fresh fruits and vegetables, whole grains, lean proteins, low-fat dairy, and heart-healthy fats.   Work with your health care provider or diet and nutrition specialist (dietitian) to adjust your eating plan to your individual calorie needs.  This information is not intended to replace advice given to you by your health care provider. Make sure you discuss any questions you have with your health care provider.  Document Released: 08/25/2011 Document Revised: 08/29/2016 Document Reviewed: 08/29/2016  Elsevier Interactive Patient Education  2019 Elsevier Inc.

## 2019-03-14 NOTE — Progress Notes (Signed)
New patient/Blood Pressure

## 2019-04-11 ENCOUNTER — Other Ambulatory Visit: Payer: Self-pay

## 2019-04-11 ENCOUNTER — Ambulatory Visit: Payer: Self-pay | Attending: Family Medicine | Admitting: Family Medicine

## 2019-04-11 ENCOUNTER — Encounter: Payer: Self-pay | Admitting: Family Medicine

## 2019-04-11 DIAGNOSIS — R252 Cramp and spasm: Secondary | ICD-10-CM

## 2019-04-11 DIAGNOSIS — I10 Essential (primary) hypertension: Secondary | ICD-10-CM

## 2019-04-11 NOTE — Progress Notes (Signed)
Patient has been called and DOB has been verified. Patient has been screened and transferred to PCP to start phone visit.     

## 2019-04-11 NOTE — Progress Notes (Signed)
   Virtual Visit via Telephone Note  I connected with Ashlee Fox on 04/11/19 at 11:10 AM EDT by telephone and verified that I am speaking with the correct person using two identifiers.   I discussed the limitations, risks, security and privacy concerns of performing an evaluation and management service by telephone and the availability of in person appointments. I also discussed with the patient that there may be a patient responsible charge related to this service. The patient expressed understanding and agreed to proceed.  Patient Location: Home Provider Location: Office  Others participating in call: Just patient and provider   History of Present Illness:      33 yo female seen in follow-up of hypertension.  Patient has restarted the use of hydrochlorothiazide.  She has not monitored her blood pressure.  She denies any headaches or dizziness related to her blood pressure.  Upon questioning, she has experienced some generalized muscle cramps and she is not sure if this is related to her use of the hydrochlorothiazide.  Patient otherwise feels well at this time.  She has had no chest pain, no palpitations, no shortness of breath or cough, no abdominal pain-no nausea or vomiting.  She has had no peripheral edema.   Past Medical History:  Diagnosis Date  . Anemia   . Complication of anesthesia   . Hx MRSA infection   . Morbid obesity ([place]) 10/27/2017    Past Surgical History:  Procedure Laterality Date  . TONSILLECTOMY      Family History  Problem Relation Age of Onset  . Diabetes Maternal Grandmother   . Hypertension Maternal Grandmother   . Breast cancer Maternal Uncle   . Diabetes Maternal Uncle     Social History   Tobacco Use  . Smoking status: Never Smoker  . Smokeless tobacco: Never Used  Substance Use Topics  . Alcohol use: No  . Drug use: No     Allergies  Allergen Reactions  . Amoxicillin Anaphylaxis       Observations/Objective: No vital signs or physical  exam conducted as visit was done via telephone  Assessment and Plan: 1. Essential hypertension; 2. Muscle cramping She has been encouraged to schedule nurse visit to have her blood pressure checked in the next 1 to 2 weeks.  At that time, she will also have lab visit for BMP in follow-up of diuretic use and complaint of muscle cramping.  She will be notified if a change is needed in her blood pressure medication or if there are any abnormalities on her BMP.  DASH diet encouraged. - Basic Metabolic Panel; Future   Follow Up Instructions:Return for BP -nurse visit BP check and lab in 1-2 weeks; if normal then 4-6 months.    I discussed the assessment and treatment plan with the patient. The patient was provided an opportunity to ask questions and all were answered. The patient agreed with the plan and demonstrated an understanding of the instructions.   The patient was advised to call back or seek an in-person evaluation if the symptoms worsen or if the condition fails to improve as anticipated.  I provided 7 minutes of non-face-to-face time during this encounter.  Total time related to encounter of 12 minutes including review of prior notes, labs as well as tele-visit.  Antony Blackbird, MD

## 2019-04-18 ENCOUNTER — Other Ambulatory Visit: Payer: Self-pay

## 2019-04-18 ENCOUNTER — Ambulatory Visit: Payer: Self-pay | Attending: Family Medicine

## 2019-04-18 DIAGNOSIS — R252 Cramp and spasm: Secondary | ICD-10-CM

## 2019-04-18 DIAGNOSIS — I10 Essential (primary) hypertension: Secondary | ICD-10-CM

## 2019-04-18 NOTE — Progress Notes (Signed)
Have patient continue current medication and follow-up in 3-4 months if she does not already have an appointment scheduled.

## 2019-04-18 NOTE — Progress Notes (Signed)
Patient came into office to get her blood pressure checked and it was 141/90. Per pt she is taking her medications as prescribed and do not have any c/o.

## 2019-04-19 LAB — BASIC METABOLIC PANEL WITH GFR
BUN/Creatinine Ratio: 13 (ref 9–23)
BUN: 9 mg/dL (ref 6–20)
CO2: 25 mmol/L (ref 20–29)
Calcium: 9.4 mg/dL (ref 8.7–10.2)
Chloride: 100 mmol/L (ref 96–106)
Creatinine, Ser: 0.72 mg/dL (ref 0.57–1.00)
GFR calc Af Amer: 127 mL/min/1.73
GFR calc non Af Amer: 110 mL/min/1.73
Glucose: 122 mg/dL — ABNORMAL HIGH (ref 65–99)
Potassium: 3.9 mmol/L (ref 3.5–5.2)
Sodium: 140 mmol/L (ref 134–144)

## 2019-05-01 ENCOUNTER — Other Ambulatory Visit: Payer: Self-pay

## 2019-05-01 ENCOUNTER — Encounter: Payer: Self-pay | Admitting: Family Medicine

## 2019-05-01 ENCOUNTER — Ambulatory Visit: Payer: Self-pay | Attending: Family Medicine | Admitting: Family Medicine

## 2019-05-01 VITALS — BP 127/82 | HR 100 | Temp 99.9°F | Ht 70.0 in | Wt 395.4 lb

## 2019-05-01 DIAGNOSIS — M25562 Pain in left knee: Secondary | ICD-10-CM

## 2019-05-01 DIAGNOSIS — Z880 Allergy status to penicillin: Secondary | ICD-10-CM | POA: Insufficient documentation

## 2019-05-01 DIAGNOSIS — Z79899 Other long term (current) drug therapy: Secondary | ICD-10-CM | POA: Insufficient documentation

## 2019-05-01 DIAGNOSIS — Z7984 Long term (current) use of oral hypoglycemic drugs: Secondary | ICD-10-CM | POA: Insufficient documentation

## 2019-05-01 DIAGNOSIS — G8929 Other chronic pain: Secondary | ICD-10-CM | POA: Insufficient documentation

## 2019-05-01 DIAGNOSIS — Z6841 Body Mass Index (BMI) 40.0 and over, adult: Secondary | ICD-10-CM

## 2019-05-01 MED ORDER — NAPROXEN 500 MG PO TABS: 500.0000 mg | ORAL_TABLET | Freq: Two times a day (BID) | ORAL | 0 refills | Status: DC

## 2019-05-01 NOTE — Patient Instructions (Signed)
Osteoarthritis  Osteoarthritis is a type of arthritis that affects tissue that covers the ends of bones in joints (cartilage). Cartilage acts as a cushion between the bones and helps them move smoothly. Osteoarthritis results when cartilage in the joints gets worn down. Osteoarthritis is sometimes called "wear and tear" arthritis. Osteoarthritis is the most common form of arthritis. It often occurs in older people. It is a condition that gets worse over time (a progressive condition). Joints that are most often affected by this condition are in:  Fingers.  Toes.  Hips.  Knees.  Spine, including neck and lower back. What are the causes? This condition is caused by age-related wearing down of cartilage that covers the ends of bones. What increases the risk? The following factors may make you more likely to develop this condition:  Older age.  Being overweight or obese.  Overuse of joints, such as in athletes.  Past injury of a joint.  Past surgery on a joint.  Family history of osteoarthritis. What are the signs or symptoms? The main symptoms of this condition are pain, swelling, and stiffness in the joint. The joint may lose its shape over time. Small pieces of bone or cartilage may break off and float inside of the joint, which may cause more pain and damage to the joint. Small deposits of bone (osteophytes) may grow on the edges of the joint. Other symptoms may include:  A grating or scraping feeling inside the joint when you move it.  Popping or creaking sounds when you move. Symptoms may affect one or more joints. Osteoarthritis in a major joint, such as your knee or hip, can make it painful to walk or exercise. If you have osteoarthritis in your hands, you might not be able to grip items, twist your hand, or control small movements of your hands and fingers (fine motor skills). How is this diagnosed? This condition may be diagnosed based on:  Your medical history.  A  physical exam.  Your symptoms.  X-rays of the affected joint(s).  Blood tests to rule out other types of arthritis. How is this treated? There is no cure for this condition, but treatment can help to control pain and improve joint function. Treatment plans may include:  A prescribed exercise program that allows for rest and joint relief. You may work with a physical therapist.  A weight control plan.  Pain relief techniques, such as: ? Applying heat and cold to the joint. ? Electric pulses delivered to nerve endings under the skin (transcutaneous electrical nerve stimulation, or TENS). ? Massage. ? Certain nutritional supplements.  NSAIDs or prescription medicines to help relieve pain.  Medicine to help relieve pain and inflammation (corticosteroids). This can be given by mouth (orally) or as an injection.  Assistive devices, such as a brace, wrap, splint, specialized glove, or cane.  Surgery, such as: ? An osteotomy. This is done to reposition the bones and relieve pain or to remove loose pieces of bone and cartilage. ? Joint replacement surgery. You may need this surgery if you have very bad (advanced) osteoarthritis. Follow these instructions at home: Activity  Rest your affected joints as directed by your health care provider.  Do not drive or use heavy machinery while taking prescription pain medicine.  Exercise as directed. Your health care provider or physical therapist may recommend specific types of exercise, such as: ? Strengthening exercises. These are done to strengthen the muscles that support joints that are affected by arthritis. They can be performed   with weights or with exercise bands to add resistance. ? Aerobic activities. These are exercises, such as brisk walking or water aerobics, that get your heart pumping. ? Range-of-motion activities. These keep your joints easy to move. ? Balance and agility exercises. Managing pain, stiffness, and swelling       If directed, apply heat to the affected area as often as told by your health care provider. Use the heat source that your health care provider recommends, such as a moist heat pack or a heating pad. ? If you have a removable assistive device, remove it as told by your health care provider. ? Place a towel between your skin and the heat source. If your health care provider tells you to keep the assistive device on while you apply heat, place a towel between the assistive device and the heat source. ? Leave the heat on for 20-30 minutes. ? Remove the heat if your skin turns bright red. This is especially important if you are unable to feel pain, heat, or cold. You may have a greater risk of getting burned.  If directed, put ice on the affected joint: ? If you have a removable assistive device, remove it as told by your health care provider. ? Put ice in a plastic bag. ? Place a towel between your skin and the bag. If your health care provider tells you to keep the assistive device on during icing, place a towel between the assistive device and the bag. ? Leave the ice on for 20 minutes, 2-3 times a day. General instructions  Take over-the-counter and prescription medicines only as told by your health care provider.  Maintain a healthy weight. Follow instructions from your health care provider for weight control. These may include dietary restrictions.  Do not use any products that contain nicotine or tobacco, such as cigarettes and e-cigarettes. These can delay bone healing. If you need help quitting, ask your health care provider.  Use assistive devices as directed by your health care provider.  Keep all follow-up visits as told by your health care provider. This is important. Where to find more information  [employer] of Arthritis and Musculoskeletal and Skin Diseases: www.niams.SouthEx[employer].es  [employer] on Aging: http://kim-miller.com/  American College of Rheumatology:  www.rheumatology.org Contact a health care provider if:  Your skin turns red.  You develop a rash.  You have pain that gets worse.  You have a fever along with joint or muscle aches. Get help right away if:  You lose a lot of weight.  You suddenly lose your appetite.  You have night sweats. Summary  Osteoarthritis is a type of arthritis that affects tissue covering the ends of bones in joints (cartilage).  This condition is caused by age-related wearing down of cartilage that covers the ends of bones.  The main symptom of this condition is pain, swelling, and stiffness in the joint.  There is no cure for this condition, but treatment can help to control pain and improve joint function. This information is not intended to replace advice given to you by your health care provider. Make sure you discuss any questions you have with your health care provider. Document Released: 09/05/2005 Document Revised: 08/18/2017 Document Reviewed: 05/09/2016 Elsevier Patient Education  2020 [place].  Acute Knee Pain, Adult Many things can cause knee pain. Sometimes, knee pain is sudden (acute) and may be caused by damage, swelling, or irritation of the muscles and tissues that support your knee. The pain often goes  on its own with time and rest. If the pain does not go away, tests may be done to find out what is causing the pain. Follow these instructions at home: Pay attention to any changes in your symptoms. Take these actions to relieve your pain. If you have a knee sleeve or brace:   Wear the sleeve or brace as told by your doctor. Remove it only as told by your doctor.  Loosen the sleeve or brace if your toes: ? Tingle. ? Become numb. ? Turn cold and blue.  Keep the sleeve or brace clean.  If the sleeve or brace is not waterproof: ? Do not let it get wet. ? Cover it with a watertight covering when you take a bath or shower. Activity  Rest your knee.  Do not do things  that cause pain.  Avoid activities where both feet leave the ground at the same time (high-impact activities). Examples are running, jumping rope, and doing jumping jacks.  Work with a physical therapist to make a safe exercise program, as told by your doctor. Managing pain, stiffness, and swelling   If told, put ice on the knee: ? Put ice in a plastic bag. ? Place a towel between your skin and the bag. ? Leave the ice on for 20 minutes, 2-3 times a day.  If told, put pressure (compression) on your injured knee to control swelling, give support, and help with discomfort. Compression may be done with an elastic bandage. General instructions  Take all medicines only as told by your doctor.  Raise (elevate) your knee while you are sitting or lying down. Make sure your knee is higher than your heart.  Sleep with a pillow under your knee.  Do not use any products that contain nicotine or tobacco. These include cigarettes, e-cigarettes, and chewing tobacco. These products may slow down healing. If you need help quitting, ask your doctor.  If you are overweight, work with your doctor and a food expert (dietitian) to set goals to lose weight. Being overweight can make your knee hurt more.  Keep all follow-up visits as told by your doctor. This is important. Contact a doctor if:  The knee pain does not stop.  The knee pain changes or gets worse.  You have a fever along with knee pain.  Your knee feels warm when you touch it.  Your knee gives out or locks up. Get help right away if:  Your knee swells, and the swelling gets worse.  You cannot move your knee.  You have very bad knee pain. Summary  Many things can cause knee pain. The pain often goes away on its own with time and rest.  Your doctor may do tests to find out the cause of the pain.  Pay attention to any changes in your symptoms. Relieve your pain with rest, medicines, light activity, and use of ice.  Get help  right away if you cannot move your knee or your knee pain is very bad. This information is not intended to replace advice given to you by your health care provider. Make sure you discuss any questions you have with your health care provider. Document Released: 12/02/2008 Document Revised: 02/15/2018 Document Reviewed: 02/15/2018 Elsevier Patient Education  2020 Elsevier Inc.  

## 2019-05-01 NOTE — Progress Notes (Signed)
Established Patient Office Visit  Subjective:  Patient ID: Ashlee Rogersrica T Macauley, female    DOB: 02/28/1986  Age: 33 y.o. MRN: [id number]  CC:  Chief Complaint  Patient presents with  . Knee Pain    HPI Ashlee Fox presents for complaint of left knee pain for over a year. Patient thought that her knee pain was due to her going up and downstairs as she lived on the third floor. She however she moved to the ground floor and has about 8 stairs as opposed to 20 in the past but still with knee pain and pain is now daily/continuous. Occasionally does not hurt if she is not performing activity. No prior injury to the left knee. Left knee occasionally feels as if it will give away and she is afraid that she will fall. She is not sure if she has had any swelling or increased warmth in her left knee. Pain is sharp and on the outside of her knee and right now is about a 5 but sometimes greater than 10.  She currently takes over-the-counter BC powders for knee pain with some relief of pain.  She denies any heartburn or abdominal discomfort-no nausea and vomiting, related to her use of BC powders.  No family history of connective tissue disorders or rheumatoid arthritis but her paternal grandfather had lumbar degenerative disc disease.  Past Medical History:  Diagnosis Date  . Anemia   . Complication of anesthesia   . Hx MRSA infection   . Morbid obesity (HCC) 10/27/2017    Past Surgical History:  Procedure Laterality Date  . TONSILLECTOMY      Family History  Problem Relation Age of Onset  . Diabetes Maternal Grandmother   . Hypertension Maternal Grandmother   . Breast cancer Maternal Uncle   . Diabetes Maternal Uncle     Social History   Tobacco Use  . Smoking status: Never Smoker  . Smokeless tobacco: Never Used  Substance Use Topics  . Alcohol use: No  . Drug use: No    Outpatient Medications Prior to Visit  Medication Sig Dispense Refill  . cholecalciferol (VITAMIN D) 1000 units  tablet Take 1 capsule by mouth daily.    . hydrochlorothiazide (HYDRODIURIL) 25 MG tablet Take 1 tablet (25 mg total) by mouth daily. 30 tablet 4  . IRON PO Take 1 tablet by mouth 2 (two) times daily.    . metFORMIN (GLUCOPHAGE) 500 MG tablet Take 1 tablet (500 mg total) by mouth 3 (three) times daily. 90 tablet 4  . Multiple Vitamin (MULTIVITAMIN WITH MINERALS) TABS Take 1 tablet by mouth daily.    . medroxyPROGESTERone (PROVERA) 10 MG tablet Take 1 tablet (10 mg total) by mouth daily. Use for ten days (Patient not taking: Reported on 04/11/2019) 10 tablet 2   No facility-administered medications prior to visit.     Allergies  Allergen Reactions  . Amoxicillin Anaphylaxis    ROS Review of Systems  Constitutional: Positive for fatigue (occasional). Negative for chills and fever.  HENT: Negative for sore throat and trouble swallowing.   Respiratory: Negative for cough and shortness of breath.   Cardiovascular: Negative for chest pain and palpitations.  Gastrointestinal: Negative for abdominal pain, blood in stool, constipation, diarrhea and nausea.  Endocrine: Negative for polydipsia, polyphagia and polyuria.  Genitourinary: Negative for dysuria and frequency.  Musculoskeletal: Positive for arthralgias and gait problem.  Neurological: Negative for dizziness and headaches.  Hematological: Negative for adenopathy.      Objective:  Physical Exam  Constitutional: She is oriented to person, place, and time. She appears well-developed and well-nourished.  Morbidly obese female in no acute distress  Cardiovascular: Normal rate and regular rhythm.  Pulmonary/Chest: Effort normal and breath sounds normal.  Abdominal: Soft. There is no abdominal tenderness. There is no rebound and no guarding.  Musculoskeletal:        General: Tenderness present. No edema.     Comments: Patient with medial greater than lateral joint line tenderness of the left knee; no joint instability, negative  Lachman's  Neurological: She is alert and oriented to person, place, and time.  Skin: Skin is warm and dry.  Psychiatric: She has a normal mood and affect. Her behavior is normal.  Nursing note and vitals reviewed.   BP 127/82 (BP Location: Left Arm, Patient Position: Sitting, Cuff Size: Large)   Pulse 100   Temp 99.9 F (37.7 C) (Oral)   Ht 5\' 10"  (1.778 m)   Wt (!) 395 lb 6.4 oz (179.4 kg)   SpO2 100%   BMI 56.73 kg/m  Wt Readings from Last 3 Encounters:  05/01/19 (!) 395 lb 6.4 oz (179.4 kg)  03/14/19 (!) 383 lb 9.6 oz (174 kg)  03/30/18 (!) 373 lb 3.2 oz (169.3 kg)     Health Maintenance Due  Topic Date Due  . TETANUS/TDAP  04/19/2004  . INFLUENZA VACCINE  04/20/2019     Lab Results  Component Value Date   TSH 0.502 10/27/2017   Lab Results  Component Value Date   WBC 9.6 05/13/2012   HGB 11.6 (L) 05/13/2012   HCT 36.2 05/13/2012   MCV 88.3 05/13/2012   PLT 305 05/13/2012   Lab Results  Component Value Date   NA 140 04/18/2019   K 3.9 04/18/2019   CO2 25 04/18/2019   GLUCOSE 122 (H) 04/18/2019   BUN 9 04/18/2019   CREATININE 0.72 04/18/2019   CALCIUM 9.4 04/18/2019   No results found for: CHOL No results found for: HDL No results found for: LDLCALC No results found for: TRIG No results found for: [facility] Lab Results  Component Value Date   HGBA1C 5.3 03/14/2019      Assessment & Plan:  1. Acute pain of left knee (acute on chronic left knee pain);Morbid Obesity Prescription provided for naproxen to take 1 twice daily after meal as needed for knee pain.  No effusion/swelling appreciated but if this occurs, patient can use cold compress/ice pad to protected skin for 15 to 20 minutes a few times per day.  Patient is encouraged to make dietary changes to help facilitate weight loss which should also help with knee pain in the long-term.  Referral to orthopedics for further evaluation and treatment of current knee pain - AMB referral to orthopedics -  naproxen (NAPROSYN) 500 MG tablet; Take 1 tablet (500 mg total) by mouth 2 (two) times daily with a meal. As needed for pain  Dispense: 60 tablet; Refill: 0   An After Visit Summary was printed and given to the patient.  Follow-up: Return for keep scheduled follow-up and as needed.   Antony Blackbird, MD

## 2019-05-01 NOTE — Progress Notes (Signed)
Left Knee pain

## 2019-08-30 ENCOUNTER — Telehealth: Payer: Self-pay | Admitting: Family Medicine

## 2019-08-30 NOTE — Telephone Encounter (Signed)
Called patient and was not able to reach her due to patient voicemail box is full

## 2019-08-30 NOTE — Telephone Encounter (Signed)
Patient called about abscess under both her breast and would like some medication for it she stated that you all had spoken about it before

## 2019-08-30 NOTE — Telephone Encounter (Signed)
Please contact patient. In the past she stated that she does not like to take medications and was trying to see if there were any natural remedies for boils and I suggested that she use a tumeric paste. Does she wish to have an antibiotic prescribed and if to which pharmacy and she needs to schedule a follow-up appointment

## 2019-09-02 ENCOUNTER — Other Ambulatory Visit: Payer: Self-pay | Admitting: Family Medicine

## 2019-09-02 DIAGNOSIS — I10 Essential (primary) hypertension: Secondary | ICD-10-CM

## 2019-09-04 NOTE — Telephone Encounter (Signed)
Spoke with patient and she stated she would like to try the antibiotic for her boils because all the over the counter things are not working. Per pt she would like it sent to [place].

## 2019-09-06 ENCOUNTER — Other Ambulatory Visit: Payer: Self-pay | Admitting: Family Medicine

## 2019-09-06 DIAGNOSIS — L0292 Furuncle, unspecified: Secondary | ICD-10-CM

## 2019-09-06 DIAGNOSIS — L0293 Carbuncle, unspecified: Secondary | ICD-10-CM

## 2019-09-06 MED ORDER — DOXYCYCLINE HYCLATE 100 MG PO TABS: 100.0000 mg | ORAL_TABLET | Freq: Two times a day (BID) | ORAL | 0 refills | Status: DC

## 2019-09-06 NOTE — Telephone Encounter (Signed)
Spoke with patient and she informed staff that she is currently not able to have a private conversation and will call office back later.

## 2019-09-06 NOTE — Telephone Encounter (Signed)
Prescription has been sent to patient's pharmacy for doxycycline 100 mg twice daily x10 days to help with boils.  Patient should make follow-up appointment.

## 2019-09-06 NOTE — Progress Notes (Signed)
Patient ID: Ashlee Fox, female   DOB: 10-18-1985, 33 y.o.   MRN: [id number]   Patient left message with complaint of having boils and would now like to try an antibiotic to help with these areas.  Prescription will be sent to her pharmacy for doxycycline and she will be asked to make follow-up appointment.  Patient is allergic to amoxicillin.

## 2019-09-11 NOTE — Telephone Encounter (Signed)
Patient name and DOB verified. Patient aware.

## 2019-10-02 ENCOUNTER — Encounter: Payer: Self-pay | Admitting: Family Medicine

## 2019-10-02 ENCOUNTER — Ambulatory Visit: Payer: Self-pay | Attending: Family Medicine | Admitting: Family Medicine

## 2019-10-02 ENCOUNTER — Other Ambulatory Visit: Payer: Self-pay

## 2019-10-02 VITALS — BP 127/90 | HR 79 | Resp 16 | Wt 354.8 lb

## 2019-10-02 DIAGNOSIS — L0292 Furuncle, unspecified: Secondary | ICD-10-CM

## 2019-10-02 DIAGNOSIS — M545 Low back pain, unspecified: Secondary | ICD-10-CM

## 2019-10-02 DIAGNOSIS — L0293 Carbuncle, unspecified: Secondary | ICD-10-CM

## 2019-10-02 MED ORDER — DOXYCYCLINE HYCLATE 100 MG PO TABS: 100.0000 mg | ORAL_TABLET | Freq: Two times a day (BID) | ORAL | 1 refills | Status: DC

## 2019-10-02 MED ORDER — CLINDAMYCIN PHOSPHATE 1 % EX GEL: Freq: Two times a day (BID) | CUTANEOUS | 4 refills | Status: DC

## 2019-10-02 NOTE — Progress Notes (Signed)
Pt states she doesn't know if she slept wrong but she is having discomfort in her right lower back

## 2019-10-02 NOTE — Progress Notes (Signed)
Established Patient Office Visit  Subjective:  Patient ID: Ashlee Fox, female    DOB: 09/20/1985  Age: 34 y.o. MRN: [id number]  CC:  Chief Complaint  Patient presents with  . Recurrent Skin Infections    HPI [name], 34 year old female, who is seen in follow-up of recurrent boils/skin infections.  She reports that when she recently took doxycycline there is helped to greatly cleared up numerous boils beneath her breasts.  She is starting to get some smaller boils/nodules beneath her breast as well as one larger and slightly tender nodule/boil mid chest beneath the lower border of the breasts.  She denies any fever or chills.  She denies increased thirst or urinary frequency.  Since her last visit, she has been making changes in her diet and losing weight and she is lost approximately 41 pounds since her last visit.  She intends to continue to try to lose additional weight.  She is taking the Metformin that was prescribed and she thinks that this may also be helping to decrease the number of boils however she wonders why they continue to recur.         She also reports a few days ago she woke up with some right-sided mid to low back pain that is anywhere from a 4-6 on a 0-to-10 scale.  She states that the pain is gradually improving.  She does not recall any injury to her back and no heavy lifting.  She has been exercising so she is not sure if this is causing the back pain or if she slept wrong the night before waking up with back pain.  Back pain is a dull, aching sensation and is improving.  She denies any urinary frequency or dysuria.  No history of kidney stones and no blood in the urine. She started taking otc glucosamine in addition to losing weight and now her prior left knee pain has resolved.  Past Medical History:  Diagnosis Date  . Anemia   . Complication of anesthesia   . Hx MRSA infection   . Morbid obesity (HCC) 10/27/2017    Past Surgical History:  Procedure  Laterality Date  . TONSILLECTOMY      Family History  Problem Relation Age of Onset  . Diabetes Maternal Grandmother   . Hypertension Maternal Grandmother   . Breast cancer Maternal Uncle   . Diabetes Maternal Uncle     Social History   Socioeconomic History  . Marital status: Single    Spouse name: Not on file  . Number of children: Not on file  . Years of education: Not on file  . Highest education level: Not on file  Occupational History  . Not on file  Tobacco Use  . Smoking status: Never Smoker  . Smokeless tobacco: Never Used  Substance and Sexual Activity  . Alcohol use: No  . Drug use: No  . Sexual activity: Yes    Birth control/protection: None  Other Topics Concern  . Not on file  Social History Narrative  . Not on file   Social Determinants of Health   Financial Resource Strain:   . Difficulty of Paying Living Expenses: Not on file  Food Insecurity:   . Worried About Programme researcher, broadcasting/film/video in the Last Year: Not on file  . Ran Out of Food in the Last Year: Not on file  Transportation Needs:   . Lack of Transportation (Medical): Not on file  . Lack of Transportation (Non-Medical): Not  on file  Physical Activity:   . Days of Exercise per Week: Not on file  . Minutes of Exercise per Session: Not on file  Stress:   . Feeling of Stress : Not on file  Social Connections:   . Frequency of Communication with Friends and Family: Not on file  . Frequency of Social Gatherings with Friends and Family: Not on file  . Attends Religious Services: Not on file  . Active Member of Clubs or Organizations: Not on file  . Attends Banker Meetings: Not on file  . Marital Status: Not on file  Intimate Partner Violence:   . Fear of Current or Ex-Partner: Not on file  . Emotionally Abused: Not on file  . Physically Abused: Not on file  . Sexually Abused: Not on file    Outpatient Medications Prior to Visit  Medication Sig Dispense Refill  . cholecalciferol  (VITAMIN D) 1000 units tablet Take 1 capsule by mouth daily.    Marland Kitchen doxycycline (VIBRA-TABS) 100 MG tablet Take 1 tablet (100 mg total) by mouth 2 (two) times daily. (Patient not taking: Reported on 10/02/2019) 20 tablet 0  . hydrochlorothiazide (HYDRODIURIL) 25 MG tablet Take 1 tablet (25 mg total) by mouth daily. Must have office visit for refills 30 tablet 0  . IRON PO Take 1 tablet by mouth 2 (two) times daily.    . medroxyPROGESTERone (PROVERA) 10 MG tablet Take 1 tablet (10 mg total) by mouth daily. Use for ten days (Patient not taking: Reported on 04/11/2019) 10 tablet 2  . metFORMIN (GLUCOPHAGE) 500 MG tablet Take 1 tablet (500 mg total) by mouth 3 (three) times daily. 90 tablet 4  . Multiple Vitamin (MULTIVITAMIN WITH MINERALS) TABS Take 1 tablet by mouth daily.    . naproxen (NAPROSYN) 500 MG tablet Take 1 tablet (500 mg total) by mouth 2 (two) times daily with a meal. As needed for pain (Patient not taking: Reported on 10/02/2019) 60 tablet 0   No facility-administered medications prior to visit.    Allergies  Allergen Reactions  . Amoxicillin Anaphylaxis    ROS Review of Systems  Constitutional: Positive for fatigue. Negative for chills and fever.  HENT: Negative for sore throat and trouble swallowing.   Eyes: Negative for photophobia and visual disturbance.  Respiratory: Negative for cough and shortness of breath.   Cardiovascular: Negative for chest pain and palpitations.  Gastrointestinal: Negative for abdominal pain, blood in stool, constipation, diarrhea and nausea.  Endocrine: Negative for cold intolerance, heat intolerance, polydipsia, polyphagia and polyuria.  Genitourinary: Negative for dysuria, frequency and hematuria.  Musculoskeletal: Positive for back pain. Negative for arthralgias.  Skin: Positive for wound.       Recurrent skin infections/boils and scarring/hyperpigmented areas from prior infections  Neurological: Negative for dizziness and headaches.    Hematological: Negative for adenopathy. Does not bruise/bleed easily.      Objective:    Physical Exam  Constitutional: She is oriented to person, place, and time. She appears well-developed and well-nourished.  Well-nourished well-developed morbidly obese female in no acute distress wearing mask as per office COVID-19 protocol  Neck: No JVD present.  Cardiovascular: Normal rate and regular rhythm.  Pulmonary/Chest: Effort normal and breath sounds normal.  Abdominal: There is no abdominal tenderness. There is no rebound and no guarding.  Truncal obesity  Musculoskeletal:        General: Tenderness present.     Comments: Patient with spasm of the right lateral and mid back and mild  lumbosacral discomfort to palpation  Lymphadenopathy:    She has no cervical adenopathy.  Neurological: She is alert and oriented to person, place, and time.  Skin:  Skin beneath the breast is slightly moist.  She has areas of hyperpigmented macules beneath both breast.  A few small scattered raised papules and one large half of a small grape size nodule in the mid chest at the inferior margin of the breasts.  Patient with some scarring and small nodules in the axilla bilaterally  Psychiatric: She has a normal mood and affect. Her behavior is normal.  Nursing note and vitals reviewed.   BP 127/90   Pulse 79   Resp 16   Wt (!) 354 lb 12.8 oz (160.9 kg)   SpO2 98%   BMI 50.91 kg/m  Wt Readings from Last 3 Encounters:  10/02/19 (!) 354 lb 12.8 oz (160.9 kg)  05/01/19 (!) 395 lb 6.4 oz (179.4 kg)  03/14/19 (!) 383 lb 9.6 oz (174 kg)     Health Maintenance Due  Topic Date Due  . TETANUS/TDAP  04/19/2004  . INFLUENZA VACCINE  04/20/2019      Lab Results  Component Value Date   TSH 0.502 10/27/2017   Lab Results  Component Value Date   WBC 9.6 05/13/2012   HGB 11.6 (L) 05/13/2012   HCT 36.2 05/13/2012   MCV 88.3 05/13/2012   PLT 305 05/13/2012   Lab Results  Component Value Date   NA  140 04/18/2019   K 3.9 04/18/2019   CO2 25 04/18/2019   GLUCOSE 122 (H) 04/18/2019   BUN 9 04/18/2019   CREATININE 0.72 04/18/2019   CALCIUM 9.4 04/18/2019   No results found for: CHOL No results found for: HDL No results found for: LDLCALC No results found for: TRIG No results found for: [facility] Lab Results  Component Value Date   HGBA1C 5.3 03/14/2019      Assessment & Plan:  1. Recurrent boils Prescription for doxycycline with refill if needed.  Prescription for clindamycin gel to apply twice daily to affected areas to help prevent recurrence.  Also advised patient to try and keep skin dry and change into dry clothing as soon as possible after exercising.  She may also wish to try an over-the-counter antibacterial soap to the areas that tend to have recurrent infections.  Continue use of metronidazole and continue weight loss efforts. - doxycycline (VIBRA-TABS) 100 MG tablet; Take 1 tablet (100 mg total) by mouth 2 (two) times daily.  Dispense: 20 tablet; Refill: 1 - clindamycin (CLINDAGEL) 1 % gel; Apply topically 2 (two) times daily. To help prevent skin infections  Dispense: 60 g; Refill: 4  2. Acute right-sided low back pain without sciatica She can use warm moist heat to the affected areas as well as over-the-counter pain medication as needed.  She declined prescription for muscle relaxant.  Call or return if symptoms or not improving.  No symptoms suggestive of UTI or kidney stone as the cause of her back pain.  Back pain is most likely musculoskeletal in origin.  An After Visit Summary was printed and given to the patient.  Follow-up: Return for as needed; schedule annual well exam at your conveience.    Antony Blackbird, MD

## 2019-10-02 NOTE — Patient Instructions (Addendum)
Congratulations on your weight loss!!!    Hidradenitis Suppurativa Hidradenitis suppurativa is a long-term (chronic) skin disease. It is similar to a severe form of acne, but it affects areas of the body where acne would be unusual, especially areas of the body where skin rubs against skin and becomes moist. These include:  Underarms.  Groin.  Genital area.  Buttocks.  Upper thighs.  Breasts. Hidradenitis suppurativa may start out as small lumps or pimples caused by blocked sweat glands or hair follicles. Pimples may develop into deep sores that break open (rupture) and drain pus. Over time, affected areas of skin may thicken and become scarred. This condition is rare and does not spread from person to person (non-contagious). What are the causes? The exact cause of this condition is not known. It may be related to:  Female and female hormones.  An overactive disease-fighting system (immune system). The immune system may over-react to blocked hair follicles or sweat glands and cause swelling and pus-filled sores. What increases the risk? You are more likely to develop this condition if you:  Are female.  Are 35-2 years old.  Have a family history of hidradenitis suppurativa.  Have a personal history of acne.  Are overweight.  Smoke.  Take the medicine lithium. What are the signs or symptoms? The first symptoms are usually painful bumps in the skin, similar to pimples. The condition may get worse over time (progress), or it may only cause mild symptoms. If the disease progresses, symptoms may include:  Skin bumps getting bigger and growing deeper into the skin.  Bumps rupturing and draining pus.  Itchy, infected skin.  Skin getting thicker and scarred.  Tunnels under the skin (fistulas) where pus drains from a bump.  Pain during daily activities, such as pain during walking if your groin area is affected.  Emotional problems, such as stress or depression. This  condition may affect your appearance and your ability or willingness to wear certain clothes or do certain activities. How is this diagnosed? This condition is diagnosed by a health care provider who specializes in skin diseases (dermatologist). You may be diagnosed based on:  Your symptoms and medical history.  A physical exam.  Testing a pus sample for infection.  Blood tests. How is this treated? Your treatment will depend on how severe your symptoms are. The same treatment will not work for everybody with this condition. You may need to try several treatments to find what works best for you. Treatment may include:  Cleaning and bandaging (dressing) your wounds as needed.  Lifestyle changes, such as new skin care routines.  Taking medicines, such as: ? Antibiotics. ? Acne medicines. ? Medicines to reduce the activity of the immune system. ? A diabetes medicine (metformin). ? Birth control pills, for women. ? Steroids to reduce swelling and pain.  Working with a mental health care provider, if you experience emotional distress due to this condition. If you have severe symptoms that do not get better with medicine, you may need surgery. Surgery may involve:  Using a laser to clear the skin and remove hair follicles.  Opening and draining deep sores.  Removing the areas of skin that are diseased and scarred. Follow these instructions at home: Medicines   Take over-the-counter and prescription medicines only as told by your health care provider.  If you were prescribed an antibiotic medicine, take it as told by your health care provider. Do not stop taking the antibiotic even if your condition improves. Skin  care  If you have open wounds, cover them with a clean dressing as told by your health care provider. Keep wounds clean by washing them gently with soap and water when you bathe.  Do not shave the areas where you get hidradenitis suppurativa.  Do not wear  deodorant.  Wear loose-fitting clothes.  Try to avoid getting overheated or sweaty. If you get sweaty or wet, change into clean, dry clothes as soon as you can.  To help relieve pain and itchiness, cover sore areas with a warm, clean washcloth (warm compress) for 5-10 minutes as often as needed.  If told by your health care provider, take a bleach bath twice a week: ? Fill your bathtub halfway with water. ? Pour in  cup of unscented household bleach. ? Soak in the tub for 5-10 minutes. ? Only soak from the neck down. Avoid water on your face and hair. ? Shower to rinse off the bleach from your skin. General instructions  Learn as much as you can about your disease so that you have an active role in your treatment. Work closely with your health care provider to find treatments that work for you.  If you are overweight, work with your health care provider to lose weight as recommended.  Do not use any products that contain nicotine or tobacco, such as cigarettes and e-cigarettes. If you need help quitting, ask your health care provider.  If you struggle with living with this condition, talk with your health care provider or work with a mental health care provider as recommended.  Keep all follow-up visits as told by your health care provider. This is important. Where to find more information  Hidradenitis [place].: [email or web address]://[email or web address]-foundation.org/ Contact a health care provider if you have:  A flare-up of hidradenitis suppurativa.  A fever or chills.  Trouble controlling your symptoms at home.  Trouble doing your daily activities because of your symptoms.  Trouble dealing with emotional problems related to your condition. Summary  Hidradenitis suppurativa is a long-term (chronic) skin disease. It is similar to a severe form of acne, but it affects areas of the body where acne would be unusual.  The first symptoms are usually painful bumps in the skin,  similar to pimples. The condition may get worse over time (progress), or it may only cause mild symptoms.  If you have open wounds, cover them with a clean dressing as told by your health care provider. Keep wounds clean by washing them gently with soap and water when you bathe.  Besides skin care, treatment may include medicines, laser treatment, and surgery. This information is not intended to replace advice given to you by your health care provider. Make sure you discuss any questions you have with your health care provider. Document Revised: 09/13/2017 Document Reviewed: 09/13/2017 Elsevier Patient Education  [place].  Acute Back Pain, Adult Acute back pain is sudden and usually short-lived. It is often caused by an injury to the muscles and tissues in the back. The injury may result from:  A muscle or ligament getting overstretched or torn (strained). Ligaments are tissues that connect bones to each other. Lifting something improperly can cause a back strain.  Wear and tear (degeneration) of the spinal disks. Spinal disks are circular tissue that provides cushioning between the bones of the spine (vertebrae).  Twisting motions, such as while playing sports or doing yard work.  A hit to the back.  Arthritis. You may have a physical exam, lab  tests, and imaging tests to find the cause of your pain. Acute back pain usually goes away with rest and home care. Follow these instructions at home: Managing pain, stiffness, and swelling  Take over-the-counter and prescription medicines only as told by your health care provider.  Your health care provider may recommend applying ice during the first 24-48 hours after your pain starts. To do this: ? Put ice in a plastic bag. ? Place a towel between your skin and the bag. ? Leave the ice on for 20 minutes, 2-3 times a day.  If directed, apply heat to the affected area as often as told by your health care provider. Use the heat  source that your health care provider recommends, such as a moist heat pack or a heating pad. ? Place a towel between your skin and the heat source. ? Leave the heat on for 20-30 minutes. ? Remove the heat if your skin turns bright red. This is especially important if you are unable to feel pain, heat, or cold. You have a greater risk of getting burned. Activity   Do not stay in bed. Staying in bed for more than 1-2 days can delay your recovery.  Sit up and stand up straight. Avoid leaning forward when you sit, or hunching over when you stand. ? If you work at a desk, sit close to it so you do not need to lean over. Keep your chin tucked in. Keep your neck drawn back, and keep your elbows bent at a right angle. Your arms should look like the letter "L." ? Sit high and close to the steering wheel when you drive. Add lower back (lumbar) support to your car seat, if needed.  Take short walks on even surfaces as soon as you are able. Try to increase the length of time you walk each day.  Do not sit, drive, or stand in one place for more than 30 minutes at a time. Sitting or standing for long periods of time can put stress on your back.  Do not drive or use heavy machinery while taking prescription pain medicine.  Use proper lifting techniques. When you bend and lift, use positions that put less stress on your back: ? [place] your knees. ? Keep the load close to your body. ? Avoid twisting.  Exercise regularly as told by your health care provider. Exercising helps your back heal faster and helps prevent back injuries by keeping muscles strong and flexible.  Work with a physical therapist to make a safe exercise program, as recommended by your health care provider. Do any exercises as told by your physical therapist. Lifestyle  Maintain a healthy weight. Extra weight puts stress on your back and makes it difficult to have good posture.  Avoid activities or situations that make you feel anxious  or stressed. Stress and anxiety increase muscle tension and can make back pain worse. Learn ways to manage anxiety and stress, such as through exercise. General instructions  Sleep on a firm mattress in a comfortable position. Try lying on your side with your knees slightly bent. If you lie on your back, put a pillow under your knees.  Follow your treatment plan as told by your health care provider. This may include: ? Cognitive or behavioral therapy. ? Acupuncture or massage therapy. ? Meditation or yoga. Contact a health care provider if:  You have pain that is not relieved with rest or medicine.  You have increasing pain going down into your legs  or buttocks.  Your pain does not improve after 2 weeks.  You have pain at night.  You lose weight without trying.  You have a fever or chills. Get help right away if:  You develop new bowel or bladder control problems.  You have unusual weakness or numbness in your arms or legs.  You develop nausea or vomiting.  You develop abdominal pain.  You feel faint. Summary  Acute back pain is sudden and usually short-lived.  Use proper lifting techniques. When you bend and lift, use positions that put less stress on your back.  Take over-the-counter and prescription medicines and apply heat or ice as directed by your health care provider. This information is not intended to replace advice given to you by your health care provider. Make sure you discuss any questions you have with your health care provider. Document Revised: 12/25/2018 Document Reviewed: 04/19/2017 Elsevier Patient Education  2020 [employer].

## 2019-10-03 ENCOUNTER — Other Ambulatory Visit: Payer: Self-pay | Admitting: Family Medicine

## 2019-10-03 ENCOUNTER — Telehealth: Payer: Self-pay | Admitting: Family Medicine

## 2019-10-03 DIAGNOSIS — N914 Secondary oligomenorrhea: Secondary | ICD-10-CM

## 2019-10-03 NOTE — Telephone Encounter (Signed)
Declined muscle relaxer naproxen yesterday but called today to see if she can now get that medication because of her pain level

## 2019-10-04 ENCOUNTER — Other Ambulatory Visit: Payer: Self-pay | Admitting: Family Medicine

## 2019-10-04 DIAGNOSIS — M25562 Pain in left knee: Secondary | ICD-10-CM

## 2019-10-04 DIAGNOSIS — M549 Dorsalgia, unspecified: Secondary | ICD-10-CM

## 2019-10-04 MED ORDER — NAPROXEN 500 MG PO TABS: 500.0000 mg | ORAL_TABLET | Freq: Two times a day (BID) | ORAL | 0 refills | Status: DC

## 2019-10-04 MED ORDER — CYCLOBENZAPRINE HCL 5 MG PO TABS: 5.0000 mg | ORAL_TABLET | Freq: Three times a day (TID) | ORAL | 1 refills | Status: DC | PRN

## 2019-10-04 NOTE — Progress Notes (Signed)
Patient ID: Ashlee Fox, female   DOB: 04-25-1986, 34 y.o.   MRN: [id number]   Patient left phone message that she would now like to have refill of naproxen as well as muscle relaxant offered at her recent appointment

## 2019-10-04 NOTE — Telephone Encounter (Signed)
RX's sent to [facility] for naproxen and generic flexeril

## 2019-10-06 ENCOUNTER — Other Ambulatory Visit: Payer: Self-pay | Admitting: Family Medicine

## 2019-10-06 DIAGNOSIS — I10 Essential (primary) hypertension: Secondary | ICD-10-CM

## 2019-10-08 NOTE — Telephone Encounter (Signed)
Patient aware that medication was sent to pharmacy 

## 2019-11-01 ENCOUNTER — Ambulatory Visit: Payer: Medicaid Other | Attending: Family Medicine

## 2019-11-01 ENCOUNTER — Other Ambulatory Visit: Payer: Self-pay

## 2019-11-01 VITALS — BP 142/89 | HR 89 | Temp 97.7°F | Resp 18 | Ht 70.0 in | Wt 349.0 lb

## 2019-11-01 DIAGNOSIS — N946 Dysmenorrhea, unspecified: Secondary | ICD-10-CM

## 2019-11-01 NOTE — Progress Notes (Unsigned)
Ashlee Fox

## 2019-11-02 LAB — HCG, SERUM, QUALITATIVE: hCG,Beta Subunit,Qual,Serum: NEGATIVE m[IU]/mL

## 2019-11-05 ENCOUNTER — Other Ambulatory Visit: Payer: Self-pay | Admitting: Family Medicine

## 2019-11-05 DIAGNOSIS — I10 Essential (primary) hypertension: Secondary | ICD-10-CM

## 2019-11-15 ENCOUNTER — Ambulatory Visit: Payer: Self-pay | Attending: Family Medicine | Admitting: Family Medicine

## 2019-11-15 ENCOUNTER — Encounter: Payer: Self-pay | Admitting: Family Medicine

## 2019-11-15 ENCOUNTER — Other Ambulatory Visit: Payer: Self-pay

## 2019-11-15 VITALS — BP 112/76 | HR 67 | Ht 70.0 in | Wt 354.2 lb

## 2019-11-15 DIAGNOSIS — Z0000 Encounter for general adult medical examination without abnormal findings: Secondary | ICD-10-CM

## 2019-11-15 DIAGNOSIS — Z13220 Encounter for screening for lipoid disorders: Secondary | ICD-10-CM

## 2019-11-15 DIAGNOSIS — Z131 Encounter for screening for diabetes mellitus: Secondary | ICD-10-CM

## 2019-11-15 DIAGNOSIS — Z6841 Body Mass Index (BMI) 40.0 and over, adult: Secondary | ICD-10-CM

## 2019-11-15 DIAGNOSIS — R87618 Other abnormal cytological findings on specimens from cervix uteri: Secondary | ICD-10-CM

## 2019-11-15 DIAGNOSIS — R8789 Other abnormal findings in specimens from female genital organs: Secondary | ICD-10-CM

## 2019-11-15 NOTE — Progress Notes (Signed)
Subjective:  Patient ID: Ashlee Fox, female    DOB: December 31, 1985  Age: 34 y.o. MRN: [id number]  CC: Annual Exam   HPI Ashlee Fox, 34 year old African-American female, who presents for annual well exam.  She has continued to make dietary changes and is exercising.  She has lost weight.  She reports that she feels that the weight loss, exercise and use of Metformin have decreased her issues with hidradenitis/recurrent boils.  On review of chart, she has had prior Pap smear done 11/10/2017 that was negative for intraepithelial lesions or malignancy but positive for HPV.  This pap was done by her midwife, Rochele Pages, CNM.  She would like to return to that office to have her repeat Pap smear.  She overall feels that she is doing well.  She has lost almost 50 pounds since last August.  She has had improvement in knee and back pain status post weight loss.  She also is no longer taking her hydrochlorothiazide and at today's visit, her blood pressure is normal.  Past Medical History:  Diagnosis Date  . Anemia   . Complication of anesthesia   . Hx MRSA infection   . Morbid obesity (HCC) 10/27/2017    Past Surgical History:  Procedure Laterality Date  . TONSILLECTOMY      Family History  Problem Relation Age of Onset  . Diabetes Maternal Grandmother   . Hypertension Maternal Grandmother   . Breast cancer Maternal Uncle   . Diabetes Maternal Uncle     Social History   Tobacco Use  . Smoking status: Never Smoker  . Smokeless tobacco: Never Used  Substance Use Topics  . Alcohol use: No    ROS Review of Systems  Constitutional: Negative for chills, fatigue and fever.  HENT: Negative for sore throat and trouble swallowing.   Eyes: Negative for photophobia and visual disturbance.       Wears glasses  Respiratory: Negative for cough and shortness of breath.   Cardiovascular: Negative for chest pain, palpitations and leg swelling.  Gastrointestinal: Negative for abdominal pain,  blood in stool, constipation, diarrhea and nausea.  Endocrine: Negative for cold intolerance, heat intolerance, polydipsia, polyphagia and polyuria.  Genitourinary: Negative for dysuria, frequency, pelvic pain and vaginal pain.  Musculoskeletal: Negative for back pain and gait problem.  Neurological: Negative for dizziness and headaches.  Hematological: Negative for adenopathy. Does not bruise/bleed easily.    Objective:   Today's Vitals: BP 112/76   Pulse 67   Ht 5\' 10"  (1.778 m)   Wt (!) 354 lb 3.2 oz (160.7 kg)   SpO2 99%   BMI 50.82 kg/m   Physical Exam Vitals and nursing note reviewed.  Constitutional:      General: She is not in acute distress.    Appearance: Normal appearance. She is obese. She is not ill-appearing.  Neck:     Vascular: No carotid bruit.  Cardiovascular:     Rate and Rhythm: Normal rate and regular rhythm.  Pulmonary:     Effort: Pulmonary effort is normal.     Breath sounds: Normal breath sounds.  Abdominal:     Comments: Truncal obesity  Musculoskeletal:        General: No tenderness or deformity.     Cervical back: Normal range of motion and neck supple. No tenderness.     Right lower leg: No edema.     Left lower leg: No edema.  Lymphadenopathy:     Cervical: No cervical adenopathy.  Skin:  General: Skin is warm and dry.     Comments: No active skin infections but some bilateral scarring in the axilla is from past infections  Neurological:     General: No focal deficit present.     Mental Status: She is alert and oriented to person, place, and time.  Psychiatric:        Mood and Affect: Mood normal.        Behavior: Behavior normal.     Assessment & Plan:  1. Annual physical exam; 5.  Morbid obesity with BMI 50-59, adult 2. Screening for lipid disorders; 3.  Screening for diabetes mellitus Anticipatory guidance handout provided for patient's age range.  Patient is encouraged to continue efforts at weight loss through dietary change  and exercise.  Her weight on 05/01/2019 was 395 pounds and patient is now at 354.  She is not interested in referral regarding bariatric surgery at this time.  She is congratulated on her weight loss and is to continue her current efforts.  She will have lipid panel at today's visit as a screening test for hyperlipidemia and hemoglobin A1c as a screening test for prediabetes/diabetes. - Lipid panel -Hemoglobin A1c  3. Pap smear abnormality of cervix/human papillomavirus (HPV) positive She will be referred back to her gynecologist in follow-up of Pap smear that was positive for HPV. - Ambulatory referral to Gynecology    Outpatient Encounter Medications as of 11/15/2019  Medication Sig  . cholecalciferol (VITAMIN D) 1000 units tablet Take 1 capsule by mouth daily.  . clindamycin (CLINDAGEL) 1 % gel Apply topically 2 (two) times daily. To help prevent skin infections  . cyclobenzaprine (FLEXERIL) 5 MG tablet Take 1 tablet (5 mg total) by mouth 3 (three) times daily as needed for muscle spasms. May take 2 at bedtime  . hydrochlorothiazide (HYDRODIURIL) 25 MG tablet Take 1 tablet (25 mg total) by mouth daily.  . IRON PO Take 1 tablet by mouth 2 (two) times daily.  . metFORMIN (GLUCOPHAGE) 500 MG tablet TAKE 1 TABLET BY MOUTH THREE TIMES DAILY  . Multiple Vitamin (MULTIVITAMIN WITH MINERALS) TABS Take 1 tablet by mouth daily.  . naproxen (NAPROSYN) 500 MG tablet Take 1 tablet (500 mg total) by mouth 2 (two) times daily with a meal. As needed for pain  . doxycycline (VIBRA-TABS) 100 MG tablet Take 1 tablet (100 mg total) by mouth 2 (two) times daily. (Patient not taking: Reported on 11/15/2019)  . medroxyPROGESTERone (PROVERA) 10 MG tablet Take 1 tablet (10 mg total) by mouth daily. Use for ten days (Patient not taking: Reported on 04/11/2019)   No facility-administered encounter medications on file as of 11/15/2019.    An After Visit Summary was printed and given to the patient.   Follow-up:  Return in about 6 months (around 05/14/2020) for chronic issues and as needed.    Antony Blackbird MD

## 2019-11-15 NOTE — Patient Instructions (Signed)
Preventive Care 21-35 Years Old, Female Preventive care refers to visits with your health care provider and lifestyle choices that can promote health and wellness. This includes:  A yearly physical exam. This may also be called an annual well check.  Regular dental visits and eye exams.  Immunizations.  Screening for certain conditions.  Healthy lifestyle choices, such as eating a healthy diet, getting regular exercise, not using drugs or products that contain nicotine and tobacco, and limiting alcohol use. What can I expect for my preventive care visit? Physical exam Your health care provider will check your:  Height and weight. This may be used to calculate body mass index (BMI), which tells if you are at a healthy weight.  Heart rate and blood pressure.  Skin for abnormal spots. Counseling Your health care provider may ask you questions about your:  Alcohol, tobacco, and drug use.  Emotional well-being.  Home and relationship well-being.  Sexual activity.  Eating habits.  Work and work environment.  Method of birth control.  Menstrual cycle.  Pregnancy history. What immunizations do I need?  Influenza (flu) vaccine  This is recommended every year. Tetanus, diphtheria, and pertussis (Tdap) vaccine  You may need a Td booster every 10 years. Varicella (chickenpox) vaccine  You may need this if you have not been vaccinated. Human papillomavirus (HPV) vaccine  If recommended by your health care provider, you may need three doses over 6 months. Measles, mumps, and rubella (MMR) vaccine  You may need at least one dose of MMR. You may also need a second dose. Meningococcal conjugate (MenACWY) vaccine  One dose is recommended if you are age 19-21 years and a first-year college student living in a residence hall, or if you have one of several medical conditions. You may also need additional booster doses. Pneumococcal conjugate (PCV13) vaccine  You may need  this if you have certain conditions and were not previously vaccinated. Pneumococcal polysaccharide (PPSV23) vaccine  You may need one or two doses if you smoke cigarettes or if you have certain conditions. Hepatitis A vaccine  You may need this if you have certain conditions or if you travel or work in places where you may be exposed to hepatitis A. Hepatitis B vaccine  You may need this if you have certain conditions or if you travel or work in places where you may be exposed to hepatitis B. Haemophilus influenzae type b (Hib) vaccine  You may need this if you have certain conditions. You may receive vaccines as individual doses or as more than one vaccine together in one shot (combination vaccines). Talk with your health care provider about the risks and benefits of combination vaccines. What tests do I need?  Blood tests  Lipid and cholesterol levels. These may be checked every 5 years starting at age 20.  Hepatitis C test.  Hepatitis B test. Screening  Diabetes screening. This is done by checking your blood sugar (glucose) after you have not eaten for a while (fasting).  Sexually transmitted disease (STD) testing.  BRCA-related cancer screening. This may be done if you have a family history of breast, ovarian, tubal, or peritoneal cancers.  Pelvic exam and Pap test. This may be done every 3 years starting at age 21. Starting at age 30, this may be done every 5 years if you have a Pap test in combination with an HPV test. Talk with your health care provider about your test results, treatment options, and if necessary, the need for more tests.   Follow these instructions at home: Eating and drinking   Eat a diet that includes fresh fruits and vegetables, whole grains, lean protein, and low-fat dairy.  Take vitamin and mineral supplements as recommended by your health care provider.  Do not drink alcohol if: ? Your health care provider tells you not to drink. ? You are  pregnant, may be pregnant, or are planning to become pregnant.  If you drink alcohol: ? Limit how much you have to 0-1 drink a day. ? Be aware of how much alcohol is in your drink. In the [place] one drink equals one 12 oz bottle of beer (355 mL), one 5 oz glass of wine (148 mL), or one 1 oz glass of hard liquor (44 mL). Lifestyle  Take daily care of your teeth and gums.  Stay active. Exercise for at least 30 minutes on 5 or more days each week.  Do not use any products that contain nicotine or tobacco, such as cigarettes, e-cigarettes, and chewing tobacco. If you need help quitting, ask your health care provider.  If you are sexually active, practice safe sex. Use a condom or other form of birth control (contraception) in order to prevent pregnancy and STIs (sexually transmitted infections). If you plan to become pregnant, see your health care provider for a preconception visit. What's next?  Visit your health care provider once a year for a well check visit.  Ask your health care provider how often you should have your eyes and teeth checked.  Stay up to date on all vaccines. This information is not intended to replace advice given to you by your health care provider. Make sure you discuss any questions you have with your health care provider. Document Revised: 05/17/2018 Document Reviewed: 05/17/2018 Elsevier Patient Education  2020 [employer].

## 2019-11-16 LAB — LIPID PANEL
Chol/HDL Ratio: 3.9 ratio (ref 0.0–4.4)
Cholesterol, Total: 134 mg/dL (ref 100–199)
HDL: 34 mg/dL — ABNORMAL LOW
LDL Chol Calc (NIH): 82 mg/dL (ref 0–99)
Triglycerides: 92 mg/dL (ref 0–149)
VLDL Cholesterol Cal: 18 mg/dL (ref 5–40)

## 2019-11-16 LAB — HEMOGLOBIN A1C
Est. average glucose Bld gHb Est-mCnc: 100 mg/dL
Hgb A1c MFr Bld: 5.1 % (ref 4.8–5.6)

## 2019-11-22 ENCOUNTER — Encounter: Payer: Self-pay | Admitting: *Deleted

## 2019-12-11 ENCOUNTER — Encounter: Payer: Self-pay | Admitting: Certified Nurse Midwife

## 2019-12-11 ENCOUNTER — Other Ambulatory Visit (HOSPITAL_COMMUNITY)
Admission: RE | Admit: 2019-12-11 | Discharge: 2019-12-11 | Disposition: A | Discharge status: Home / Self Care | Payer: Medicaid Other | Source: Ambulatory Visit | Attending: Certified Nurse Midwife | Admitting: Certified Nurse Midwife

## 2019-12-11 ENCOUNTER — Other Ambulatory Visit: Payer: Self-pay

## 2019-12-11 ENCOUNTER — Ambulatory Visit (INDEPENDENT_AMBULATORY_CARE_PROVIDER_SITE_OTHER): Payer: Medicaid Other | Admitting: Certified Nurse Midwife

## 2019-12-11 VITALS — BP 148/95 | HR 96 | Temp 98.3°F | Ht 70.0 in | Wt 341.4 lb

## 2019-12-11 DIAGNOSIS — L732 Hidradenitis suppurativa: Secondary | ICD-10-CM | POA: Insufficient documentation

## 2019-12-11 DIAGNOSIS — Z124 Encounter for screening for malignant neoplasm of cervix: Secondary | ICD-10-CM | POA: Diagnosis present

## 2019-12-11 DIAGNOSIS — Z01419 Encounter for gynecological examination (general) (routine) without abnormal findings: Secondary | ICD-10-CM | POA: Diagnosis not present

## 2019-12-11 NOTE — Progress Notes (Signed)
GYNECOLOGY ANNUAL PREVENTATIVE CARE ENCOUNTER NOTE  History:     Ashlee Fox is a 34 y.o. G48P0030 female here for a routine annual gynecologic exam.  Current complaints: irregular cycles over the past 3 months.   Denies abnormal discharge, pelvic pain, problems with intercourse or other gynecologic concerns.    Gynecologic History Patient's last menstrual period was 11/29/2019. Contraception: none- actively trying to conceive  Last Pap: 10/2017. Results were: normal with positive HPV  Obstetric History OB History  Gravida Para Term Preterm AB Living  3 0 0 0 3 0  SAB TAB Ectopic Multiple Live Births  3 0 0 0      # Outcome Date GA Lbr Len/2nd Weight Sex Delivery Anes PTL Lv  3 SAB           2 SAB           1 SAB             Past Medical History:  Diagnosis Date  . Anemia   . Complication of anesthesia   . Hx MRSA infection   . Morbid obesity (HCC) 10/27/2017    Past Surgical History:  Procedure Laterality Date  . TONSILLECTOMY      Current Outpatient Medications on File Prior to Visit  Medication Sig Dispense Refill  . cholecalciferol (VITAMIN D) 1000 units tablet Take 1 capsule by mouth daily.    . clindamycin (CLINDAGEL) 1 % gel Apply topically 2 (two) times daily. To help prevent skin infections 60 g 4  . cyclobenzaprine (FLEXERIL) 5 MG tablet Take 1 tablet (5 mg total) by mouth 3 (three) times daily as needed for muscle spasms. May take 2 at bedtime 30 tablet 1  . hydrochlorothiazide (HYDRODIURIL) 25 MG tablet Take 1 tablet (25 mg total) by mouth daily. 90 tablet 0  . IRON PO Take 1 tablet by mouth 2 (two) times daily.    . metFORMIN (GLUCOPHAGE) 500 MG tablet TAKE 1 TABLET BY MOUTH THREE TIMES DAILY 270 tablet 0  . Multiple Vitamin (MULTIVITAMIN WITH MINERALS) TABS Take 1 tablet by mouth daily.    . naproxen (NAPROSYN) 500 MG tablet Take 1 tablet (500 mg total) by mouth 2 (two) times daily with a meal. As needed for pain 60 tablet 0  . doxycycline (VIBRA-TABS)  100 MG tablet Take 1 tablet (100 mg total) by mouth 2 (two) times daily. (Patient not taking: Reported on 11/15/2019) 20 tablet 1  . medroxyPROGESTERone (PROVERA) 10 MG tablet Take 1 tablet (10 mg total) by mouth daily. Use for ten days (Patient not taking: Reported on 04/11/2019) 10 tablet 2   No current facility-administered medications on file prior to visit.    Allergies  Allergen Reactions  . Amoxicillin Anaphylaxis    Social History:  reports that she has never smoked. She has never used smokeless tobacco. She reports that she does not drink alcohol or use drugs.  Family History  Problem Relation Age of Onset  . Diabetes Maternal Grandmother   . Hypertension Maternal Grandmother   . Breast cancer Maternal Uncle   . Diabetes Maternal Uncle     The following portions of the patient's history were reviewed and updated as appropriate: allergies, current medications, past family history, past medical history, past social history, past surgical history and problem list.  Review of Systems Pertinent items noted in HPI and remainder of comprehensive ROS otherwise negative.  Physical Exam:  BP (!) 148/95 (BP Location: Right Arm, Patient Position: Sitting,  Cuff Size: Large) Comment (Cuff Size): Thigh Cuff  Pulse 96   Temp 98.3 F (36.8 C) (Oral)   Ht 5\' 10"  (1.778 m)   Wt (!) 341 lb 6.4 oz (154.9 kg)   LMP 11/29/2019   BMI 48.99 kg/m  CONSTITUTIONAL: Well-developed, obese female in no acute distress.  HENT:  Normocephalic, atraumatic, External right and left ear normal. Oropharynx is clear and moist EYES: Conjunctivae and EOM are normal. Pupils are equal, round, and reactive to light. NECK: Normal range of motion, supple, no masses.  Normal thyroid.  SKIN: Skin is warm and dry. No rash noted. Not diaphoretic. No erythema. No pallor. MUSCULOSKELETAL: Normal range of motion. No tenderness.  No cyanosis, clubbing, or edema.  2+ distal pulses. NEUROLOGIC: Alert and oriented to person,  place, and time. Normal reflexes, muscle tone coordination.  PSYCHIATRIC: Normal mood and affect. Normal behavior. Normal judgment and thought content. CARDIOVASCULAR: Normal heart rate noted, regular rhythm RESPIRATORY: Clear to auscultation bilaterally. Effort and breath sounds normal, no problems with respiration noted. BREASTS: Symmetric in size. No masses, tenderness, nipple drainage, or lymphadenopathy bilaterally. Multiple small swollen lumps - non tender, some open with pus. Performed in the presence of a chaperone. ABDOMEN: Soft, no distention noted.  No tenderness, rebound or guarding.  PELVIC: Normal appearing external genitalia and urethral meatus; normal appearing vaginal mucosa and cervix.  Small amount of white thin discharge without odor- pt denies irritation or itching.  Pap smear obtained.  Normal uterine size, no other palpable masses, no uterine or adnexal tenderness.  Performed in the presence of a chaperone.   Assessment and Plan:    1. Pap smear for cervical cancer screening - Cytology - PAP( [place])  2. Encounter for annual routine gynecological examination - patient reports irregular cycles over the past 3 months, has been actively trying to conceive for the past 3 months as well.  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 Encouraged patient to have coping mechanism to relieve stress as stress can change weight and cycles - patient verbalizes understanding  - Discussed with patient that at age she needs to be consistently trying to conceive for 6 months prior to the start of Clomid  - encouraged patient to call office if she has not conceived by June - reports she discussed Clomid with Wal[place] during last annual appointment in 2019  - Patient request STD screening d/t new partner since November  - HIV Antibody (routine testing w rflx) - RPR  3. Hidradenitis suppurativa - Patient reports new onset of boils under breast,  thighs and axilla - was prescribed topical clindamycin by PCP  - Encouraged consistent use of topical clindamycin to heal boils once they rupture - patient verbalizes understanding  - Encouraged to keep area dry and free from consistent moisture   Will follow up results of pap smear/STD and manage accordingly. Routine preventative health maintenance measures emphasized. Please refer to After Visit Summary for other counseling recommendations.      Lajean Manes, [place] for [employer], [place]

## 2019-12-13 ENCOUNTER — Telehealth: Payer: Self-pay | Admitting: *Deleted

## 2019-12-13 LAB — RPR: RPR Ser Ql: REACTIVE — AB

## 2019-12-13 LAB — RPR, QUANT+TP ABS (REFLEX)
Rapid Plasma Reagin, Quant: 1:1 {titer} — ABNORMAL HIGH
T Pallidum Abs: NONREACTIVE

## 2019-12-13 LAB — HIV ANTIBODY (ROUTINE TESTING W REFLEX): HIV Screen 4th Generation wRfx: NONREACTIVE

## 2019-12-13 NOTE — Telephone Encounter (Signed)
Return call to patient regarding RPR lab result. Verified DOB.  Consulted with Gerrit Heck, CNM and Dr. Jolayne Panther, patient has a false positive result; T. Pallidum Abs Non-reactive; Rapid Plasma, Quant 1:1. Patient to return in one month for repeat lab.   Clovis Pu, RN

## 2019-12-13 NOTE — Telephone Encounter (Signed)
Patient called clinic regarding test results. Patient verified DOB. Patient was advised that RPR was reactive. A provider (MD or APP) will need to review her labs and will contact her for more information. Advised patient not to have any sexual contact with current partner or anyone else. Patient was very upset and crying. Advised patient, once the nurse get orders from the MD or APP for repeat blood work and/or treatment options.   Clovis Pu, RN

## 2019-12-19 ENCOUNTER — Encounter: Payer: Self-pay | Admitting: Certified Nurse Midwife

## 2019-12-19 LAB — CYTOLOGY - PAP
Adequacy: ABSENT
Chlamydia: NEGATIVE
Comment: NEGATIVE
Comment: NEGATIVE
Comment: NEGATIVE
Comment: NORMAL
Diagnosis: UNDETERMINED — AB
HPV 16: NEGATIVE
HPV 18 / 45: NEGATIVE
High risk HPV: POSITIVE — AB
Neisseria Gonorrhea: NEGATIVE

## 2020-01-02 ENCOUNTER — Other Ambulatory Visit: Payer: Self-pay

## 2020-01-02 ENCOUNTER — Ambulatory Visit: Payer: Self-pay

## 2020-01-02 VITALS — BP 136/80 | Temp 98.4°F | Wt 335.0 lb

## 2020-01-02 DIAGNOSIS — Z1239 Encounter for other screening for malignant neoplasm of breast: Secondary | ICD-10-CM

## 2020-01-02 DIAGNOSIS — R87610 Atypical squamous cells of undetermined significance on cytologic smear of cervix (ASC-US): Secondary | ICD-10-CM

## 2020-01-02 DIAGNOSIS — R87810 Cervical high risk human papillomavirus (HPV) DNA test positive: Secondary | ICD-10-CM

## 2020-01-02 NOTE — Progress Notes (Signed)
[name] is a 34 y.o. female who presents to [facility] clinic today for clinical breast exam.    Breast History: Patient denies current breast concerns including pain and tenderness.  She states that she has hidradenitis suppurativa and gets boils under her breast.  However, she is not having current issues.  Patient states she does not see perform SBE personally, but that her partner does.  Patient reports that her maternal great uncle had breast cancer, but no other family history.    Gynecological History: Pap smear not completed today. Last Pap smear was March 24, [address]201 at [facility] and was ASCUS-Pos HPV. Per patient has no history of an abnormal Pap smear, but does have "cysts on right ovaries."  Patient states that due to this her periods have become irregular. Patient reports desire for conception.    Physical exam: Breasts Breasts symmetrical. No skin abnormalities bilateral breasts. No nipple retraction bilateral breasts. No nipple discharge bilateral breasts. No lymphadenopathy. No lumps palpated bilateral breasts.  Scarring noted on bilateral breasts s/t hidradenitis suppurativa.      Pelvic/Bimanual Pap is not indicated today    Smoking History: Patient has never smoked.    Patient Navigation: Patient education provided. Access to services provided for patient through [facility] program.   Colorectal Cancer Screening: Patient has never had colonoscopy completed No complaints today.    Breast and Cervical Cancer Risk Assessment: Patient has a family history of breast cancer, known genetic mutations, or radiation treatment to the chest before age 35. Patient does not have history of cervical dysplasia, immunocompromised, or DES exposure in-utero.  Risk Assessment    Risk Scores      01/02/2020   Last edited by: Narda Rutherford, LPN   5-year risk:    Lifetime risk:           A: 34 year old Clinical Breast Exam H/O ASCUS Pap with Pos HPV   P: Patient referred  for colposcopy and scheduled for May 20th. Encouraged to call if questions or concerns arise prior to next visit.   Gerrit Heck, CNM 01/02/2020 3:54 PM

## 2020-01-20 ENCOUNTER — Other Ambulatory Visit (INDEPENDENT_AMBULATORY_CARE_PROVIDER_SITE_OTHER): Payer: Self-pay | Admitting: *Deleted

## 2020-01-20 ENCOUNTER — Other Ambulatory Visit: Payer: Self-pay

## 2020-01-20 DIAGNOSIS — Z113 Encounter for screening for infections with a predominantly sexual mode of transmission: Secondary | ICD-10-CM

## 2020-01-20 NOTE — Progress Notes (Signed)
   Patient in clinic for repeat RPR. Patient recently had a false positive result.    Clovis Pu, RN

## 2020-01-21 LAB — RPR: RPR Ser Ql: NONREACTIVE

## 2020-01-27 ENCOUNTER — Other Ambulatory Visit: Payer: Self-pay | Admitting: Family Medicine

## 2020-01-27 DIAGNOSIS — I10 Essential (primary) hypertension: Secondary | ICD-10-CM

## 2020-01-27 DIAGNOSIS — N914 Secondary oligomenorrhea: Secondary | ICD-10-CM

## 2020-02-06 ENCOUNTER — Other Ambulatory Visit (HOSPITAL_COMMUNITY)
Admission: RE | Admit: 2020-02-06 | Discharge: 2020-02-06 | Disposition: A | Discharge status: Home / Self Care | Payer: Medicaid Other | Source: Ambulatory Visit | Attending: Obstetrics and Gynecology | Admitting: Obstetrics and Gynecology

## 2020-02-06 ENCOUNTER — Encounter: Payer: Self-pay | Admitting: Obstetrics and Gynecology

## 2020-02-06 ENCOUNTER — Encounter: Payer: Self-pay | Admitting: General Practice

## 2020-02-06 ENCOUNTER — Other Ambulatory Visit: Payer: Self-pay

## 2020-02-06 ENCOUNTER — Ambulatory Visit (INDEPENDENT_AMBULATORY_CARE_PROVIDER_SITE_OTHER): Payer: Self-pay | Admitting: Obstetrics and Gynecology

## 2020-02-06 VITALS — BP 135/80 | HR 75 | Ht 70.0 in | Wt 323.4 lb

## 2020-02-06 DIAGNOSIS — R87610 Atypical squamous cells of undetermined significance on cytologic smear of cervix (ASC-US): Secondary | ICD-10-CM

## 2020-02-06 DIAGNOSIS — R87810 Cervical high risk human papillomavirus (HPV) DNA test positive: Secondary | ICD-10-CM | POA: Diagnosis present

## 2020-02-06 DIAGNOSIS — Z3202 Encounter for pregnancy test, result negative: Secondary | ICD-10-CM

## 2020-02-06 DIAGNOSIS — N870 Mild cervical dysplasia: Secondary | ICD-10-CM

## 2020-02-06 NOTE — Progress Notes (Signed)
Colposcopy Procedure Note  Ashlee Fox is a 34 y.o. G3P0030 here for colposcopy.  Indications:  Pap with high risk HPV, ASCUS  Procedure Details  LMP 01/30/20; UPT neg.    The risks (including infection, bleeding, pain) and benefits of the procedure were explained to the patient and written informed consent was obtained.  The patient was placed in the dorsal lithotomy position. A Long [place] was speculum inserted in the vagina, and the cervix was visualized.  The cervix was stained with acetic acid and visualized using the colposcope under magnification as well as with a green filter. Findings as below. Cervical biopsies were taken at 3, 9, 12 o'clock. Endocervical curettage then performed in all four quadrants. Small amount of bleeding noted that improved with pressure. Patient tolerating procedure well.  Findings: scant white acetowhite findings at SQJ   Impression: benign  Adequate: yes  Specimens:  1. Cervical biopsy 3, 12, 9 o'clock 2. Endocervical currettage  Condition: Stable  Complications: None  Plan: The patient was advised to call for any fever or for prolonged or severe pain or bleeding. She was advised to use OTC analgesics as needed for mild to moderate pain. She was advised to avoid vaginal intercourse for 48 hours or until the bleeding has completely stopped.  Will base further management on results of biopsy.   Baldemar Lenis, M.D. Attending Center for [employer] [facility])

## 2020-02-06 NOTE — Addendum Note (Signed)
Addended by: Leroy Libman on: 02/06/2020 10:40 AM   Modules accepted: Orders

## 2020-02-07 LAB — POCT PREGNANCY, URINE: Preg Test, Ur: NEGATIVE

## 2020-02-10 LAB — SURGICAL PATHOLOGY

## 2020-04-29 ENCOUNTER — Other Ambulatory Visit: Payer: Self-pay | Admitting: Family Medicine

## 2020-04-29 DIAGNOSIS — N914 Secondary oligomenorrhea: Secondary | ICD-10-CM

## 2020-04-29 DIAGNOSIS — I10 Essential (primary) hypertension: Secondary | ICD-10-CM

## 2020-04-29 DIAGNOSIS — M549 Dorsalgia, unspecified: Secondary | ICD-10-CM

## 2020-06-05 ENCOUNTER — Other Ambulatory Visit: Payer: Self-pay | Admitting: Family Medicine

## 2020-06-05 DIAGNOSIS — I10 Essential (primary) hypertension: Secondary | ICD-10-CM

## 2020-06-05 NOTE — Telephone Encounter (Signed)
Requested medication (s) are due for refill today: yes  Requested medication (s) are on the active medication list: yes   Last refill:  05/01/2020  Future visit scheduled: no  Notes to clinic: overdue for appointment    Requested Prescriptions  Pending Prescriptions Disp Refills   hydrochlorothiazide (HYDRODIURIL) 25 MG tablet [Pharmacy Med Name: hydroCHLOROthiazide 25 MG Oral Tablet] 30 tablet 0    Sig: Take 1 tablet by mouth once daily      Cardiovascular: Diuretics - Thiazide Failed - 06/05/2020  9:09 AM      Failed - Ca in normal range and within 360 days    Calcium  Date Value Ref Range Status  04/18/2019 9.4 8.7 - 10.2 mg/dL Final          Failed - Cr in normal range and within 360 days    Creatinine, Ser  Date Value Ref Range Status  04/18/2019 0.72 0.57 - 1.00 mg/dL Final          Failed - K in normal range and within 360 days    Potassium  Date Value Ref Range Status  04/18/2019 3.9 3.5 - 5.2 mmol/L Final          Failed - Na in normal range and within 360 days    Sodium  Date Value Ref Range Status  04/18/2019 140 134 - 144 mmol/L Final          Failed - Valid encounter within last 6 months    Recent Outpatient Visits           6 months ago Annual physical exam   [facility] [place], [place], MD   7 months ago Dysmenorrhea   [facility]   8 months ago Recurrent boils   [facility] Fulp, [place], MD   1 year ago Acute pain of left knee   [facility] Fulp, [place], MD   1 year ago Essential hypertension   [facility] [place], [place], MD              Passed - Last BP in normal range    BP Readings from Last 1 Encounters:  02/06/20 135/80

## 2020-06-25 ENCOUNTER — Other Ambulatory Visit: Payer: Self-pay | Admitting: Family Medicine

## 2020-06-25 DIAGNOSIS — N914 Secondary oligomenorrhea: Secondary | ICD-10-CM

## 2020-06-25 NOTE — Telephone Encounter (Signed)
Requested medication (s) are due for refill today: yes  Requested medication (s) are on the active medication list: yes  Last refill:  04/30/20  #90 0 refills   Future visit scheduled:No  Notes to clinic:  Attempted to contact patient to schedule appointment. Unable to leave message Mail box is full. Last fill states needs OV before refills.    Requested Prescriptions  Pending Prescriptions Disp Refills   metFORMIN (GLUCOPHAGE) 500 MG tablet [Pharmacy Med Name: metFORMIN HCl 500 MG Oral Tablet] 90 tablet 0    Sig: TAKE 1 TABLET BY MOUTH THREE TIMES DAILY      Endocrinology:  Diabetes - Biguanides Failed - 06/25/2020 10:22 AM      Failed - Cr in normal range and within 360 days    Creatinine, Ser  Date Value Ref Range Status  04/18/2019 0.72 0.57 - 1.00 mg/dL Final          Failed - HBA1C is between 0 and 7.9 and within 180 days    Hgb A1c MFr Bld  Date Value Ref Range Status  11/15/2019 5.1 4.8 - 5.6 % Final    Comment:             Prediabetes: 5.7 - 6.4          Diabetes: >6.4          Glycemic control for adults with diabetes: <7.0           Failed - eGFR in normal range and within 360 days    GFR calc Af Amer  Date Value Ref Range Status  04/18/2019 127 >59 mL/min/1.73 Final   GFR calc non Af Amer  Date Value Ref Range Status  04/18/2019 110 >59 mL/min/1.73 Final          Failed - Valid encounter within last 6 months    Recent Outpatient Visits           7 months ago Annual physical exam   [place] Fulp, [place], MD   7 months ago [place]   8 months ago Recurrent boils   [place] Fulp, [place], MD   1 year ago Acute pain of left knee   [facility] Fulp, [place], MD   1 year ago Essential hypertension   [place], [place], MD

## 2020-07-03 ENCOUNTER — Other Ambulatory Visit: Payer: Self-pay | Admitting: Family Medicine

## 2020-07-03 DIAGNOSIS — I10 Essential (primary) hypertension: Secondary | ICD-10-CM

## 2020-07-03 DIAGNOSIS — N914 Secondary oligomenorrhea: Secondary | ICD-10-CM

## 2020-07-03 MED ORDER — METFORMIN HCL 500 MG PO TABS: 500.0000 mg | ORAL_TABLET | Freq: Three times a day (TID) | ORAL | 0 refills | Status: DC

## 2020-07-03 MED ORDER — HYDROCHLOROTHIAZIDE 25 MG PO TABS: 25.0000 mg | ORAL_TABLET | Freq: Every day | ORAL | 0 refills | Status: DC

## 2020-07-03 NOTE — Telephone Encounter (Signed)
Requested medication (s) are due for refill today:yes  Requested medication (s) are on the active medication list: yes  Last refill:  Metformin 04/30/20  #90  0 refills,  HCTZ 06/08/20 #30  0 refills  Future visit scheduled: yes  Notes to clinic:  Pt has scheduled appointment but labs last checked 03/2019    Requested Prescriptions  Pending Prescriptions Disp Refills   metFORMIN (GLUCOPHAGE) 500 MG tablet 90 tablet 0    Sig: Take 1 tablet (500 mg total) by mouth 3 (three) times daily.      Endocrinology:  Diabetes - Biguanides Failed - 07/03/2020 11:31 AM      Failed - Cr in normal range and within 360 days    Creatinine, Ser  Date Value Ref Range Status  04/18/2019 0.72 0.57 - 1.00 mg/dL Final          Failed - HBA1C is between 0 and 7.9 and within 180 days    Hgb A1c MFr Bld  Date Value Ref Range Status  11/15/2019 5.1 4.8 - 5.6 % Final    Comment:             Prediabetes: 5.7 - 6.4          Diabetes: >6.4          Glycemic control for adults with diabetes: <7.0           Failed - eGFR in normal range and within 360 days    GFR calc Af Amer  Date Value Ref Range Status  04/18/2019 127 >59 mL/min/1.73 Final   GFR calc non Af Amer  Date Value Ref Range Status  04/18/2019 110 >59 mL/min/1.73 Final          Failed - Valid encounter within last 6 months    Recent Outpatient Visits           7 months ago Annual physical exam   [place] Fulp, [place], MD   8 months ago [place]   9 months ago Recurrent boils   [place] Fulp, [place], MD   1 year ago Acute pain of left knee   [place] Fulp, [place], MD   1 year ago Essential hypertension   [place], MD       Future Appointments             In 2 weeks Fulp, [place], MD [place]               hydrochlorothiazide (HYDRODIURIL) 25 MG tablet 30 tablet 0    Sig: Take 1 tablet (25 mg total) by mouth daily. Must have office visit for refills      Cardiovascular: Diuretics - Thiazide Failed - 07/03/2020 11:31 AM      Failed - Ca in normal range and within 360 days    Calcium  Date Value Ref Range Status  04/18/2019 9.4 8.7 - 10.2 mg/dL Final          Failed - Cr in normal range and within 360 days    Creatinine, Ser  Date Value Ref Range Status  04/18/2019 0.72 0.57 - 1.00 mg/dL Final          Failed - K in normal range and within 360 days    Potassium  Date Value Ref Range Status  04/18/2019 3.9 3.5 -  5.2 mmol/L Final          Failed - Na in normal range and within 360 days    Sodium  Date Value Ref Range Status  04/18/2019 140 134 - 144 mmol/L Final          Failed - Valid encounter within last 6 months    Recent Outpatient Visits           7 months ago Annual physical exam   [place] Fulp, [place], MD   8 months ago [place]   9 months ago Recurrent boils   [place] Fulp, [place], MD   1 year ago Acute pain of left knee   [place], MD   1 year ago Essential hypertension   [place], MD       Future Appointments             In 2 weeks Fulp, Ander [place], MD [place] - Last BP in normal range    BP Readings from Last 1 Encounters:  02/06/20 135/80

## 2020-07-03 NOTE — Telephone Encounter (Signed)
Medication Refill - Medication: metFORMIN (GLUCOPHAGE) 500 MG tablet   hydrochlorothiazide (HYDRODIURIL) 25 MG tablet Pt scheduled med refill appt for next available in November but is asking for a courtesy refill until her appt date./please advise    Has the patient contacted their pharmacy? Yes.   (Agent: If no, request that the patient contact the pharmacy for the refill.) (Agent: If yes, when and what did the pharmacy advise?)pt needs to call PCP  Preferred Pharmacy (with phone number or street name): [facility] [address] [place], [place] - [address]  [address], [place] [address]   Agent: Please be advised that RX refills may take up to 3 business days. We ask that you follow-up with your pharmacy.

## 2020-07-14 ENCOUNTER — Ambulatory Visit (INDEPENDENT_AMBULATORY_CARE_PROVIDER_SITE_OTHER): Payer: Self-pay | Admitting: *Deleted

## 2020-07-14 ENCOUNTER — Other Ambulatory Visit: Payer: Self-pay

## 2020-07-14 ENCOUNTER — Other Ambulatory Visit (HOSPITAL_COMMUNITY)
Admission: RE | Admit: 2020-07-14 | Discharge: 2020-07-14 | Disposition: A | Discharge status: Home / Self Care | Payer: Medicaid Other | Source: Ambulatory Visit | Attending: Obstetrics and Gynecology | Admitting: Obstetrics and Gynecology

## 2020-07-14 VITALS — BP 137/95 | HR 75 | Temp 98.8°F | Wt 323.8 lb

## 2020-07-14 DIAGNOSIS — Z3202 Encounter for pregnancy test, result negative: Secondary | ICD-10-CM

## 2020-07-14 DIAGNOSIS — N898 Other specified noninflammatory disorders of vagina: Secondary | ICD-10-CM | POA: Diagnosis present

## 2020-07-14 DIAGNOSIS — Z3200 Encounter for pregnancy test, result unknown: Secondary | ICD-10-CM

## 2020-07-14 LAB — POCT URINE PREGNANCY: Preg Test, Ur: NEGATIVE

## 2020-07-14 MED ORDER — FLUCONAZOLE 150 MG PO TABS: 150.0000 mg | ORAL_TABLET | Freq: Once | ORAL | 1 refills | Status: AC

## 2020-07-14 NOTE — Progress Notes (Signed)
° °  SUBJECTIVE:  34 y.o. female complains of "egg yolk" vaginal discharge and itching for "a while". Denies abnormal vaginal bleeding or significant pelvic pain or fever. No UTI symptoms. Denies history of known exposure to STD. Patient reported taking antibiotics recently. She took Monistat 7 for the vaginal discharge, itching and burning sensation. She felt that the Monistat only helped for the burning.   Patient desires pregnancy, declined birth control.  Patient's last menstrual period was 07/06/2020 (exact date).  OBJECTIVE:  She appears well, afebrile. Urine dipstick: not done.  ASSESSMENT:  Vaginal Discharge  Vaginal Itching Negative UPT  PLAN:  GC, chlamydia, trichomonas, BVAG, CVAG probe sent to lab. Treatment: To be determined once lab results are received ROV prn if symptoms persist or worsen. Rx for Diflucan 150 mg 1 tablet PO sent to pharmacy.  Clovis Pu, RN

## 2020-07-15 DIAGNOSIS — A599 Trichomoniasis, unspecified: Secondary | ICD-10-CM

## 2020-07-15 DIAGNOSIS — B9689 Other specified bacterial agents as the cause of diseases classified elsewhere: Secondary | ICD-10-CM

## 2020-07-15 LAB — CERVICOVAGINAL ANCILLARY ONLY
Bacterial Vaginitis (gardnerella): POSITIVE — AB
Candida Glabrata: NEGATIVE
Candida Vaginitis: NEGATIVE
Chlamydia: NEGATIVE
Comment: NEGATIVE
Comment: NEGATIVE
Comment: NEGATIVE
Comment: NEGATIVE
Comment: NEGATIVE
Comment: NORMAL
Neisseria Gonorrhea: NEGATIVE
Trichomonas: POSITIVE — AB

## 2020-07-15 MED ORDER — METRONIDAZOLE 500 MG PO TABS: 500.0000 mg | ORAL_TABLET | Freq: Two times a day (BID) | ORAL | 0 refills | Status: DC

## 2020-07-23 ENCOUNTER — Other Ambulatory Visit: Payer: Self-pay

## 2020-07-23 ENCOUNTER — Encounter: Payer: Self-pay | Admitting: Family Medicine

## 2020-07-23 ENCOUNTER — Ambulatory Visit: Payer: Self-pay | Attending: Family Medicine | Admitting: Family Medicine

## 2020-07-23 VITALS — BP 130/88 | HR 68 | Wt 321.4 lb

## 2020-07-23 DIAGNOSIS — Q740 Other congenital malformations of upper limb(s), including shoulder girdle: Secondary | ICD-10-CM

## 2020-07-23 DIAGNOSIS — Z79899 Other long term (current) drug therapy: Secondary | ICD-10-CM

## 2020-07-23 DIAGNOSIS — Q681 Congenital deformity of finger(s) and hand: Secondary | ICD-10-CM

## 2020-07-23 DIAGNOSIS — I10 Essential (primary) hypertension: Secondary | ICD-10-CM

## 2020-07-23 DIAGNOSIS — N914 Secondary oligomenorrhea: Secondary | ICD-10-CM

## 2020-07-23 MED ORDER — METFORMIN HCL 500 MG PO TABS: 500.0000 mg | ORAL_TABLET | Freq: Three times a day (TID) | ORAL | 1 refills | Status: DC

## 2020-07-23 MED ORDER — HYDROCHLOROTHIAZIDE 25 MG PO TABS: 25.0000 mg | ORAL_TABLET | Freq: Every day | ORAL | 1 refills | Status: DC

## 2020-07-23 NOTE — Progress Notes (Signed)
Established Patient Office Visit  Subjective:  Patient ID: Ashlee Fox Near, female    DOB: 03/02/1986  Age: 34 y.o. MRN: [id number]  CC:  Chief Complaint  Patient presents with  . Medication Refill    HPI Ashlee Fox Mexicano, 34 year old female, who is seen in follow-up of hypertension and secondary oligomenorrhea.  She reports her blood pressure has been well controlled on hydrochlorothiazide.  She has been losing weight and taking the Metformin and is now having her periods on a regular basis.  She has noticed that she has issues with her right hand as she cannot extend her hand and keep the fingers together.  She works as a Automotive engineerhand model and now has to keep her left hand on top of the right hand in order to hold the fingers of the right hand together when she is extending her right hand.  She denies any injury and no numbness or tingling in the right hand.  She also feels that her right hand looks narrower than her left hand but she wonders if this is a result of her ongoing weight loss.  Past Medical History:  Diagnosis Date  . Anemia   . Complication of anesthesia   . Hx MRSA infection   . Morbid obesity (HCC) 10/27/2017    Past Surgical History:  Procedure Laterality Date  . TONSILLECTOMY      Family History  Problem Relation Age of Onset  . Diabetes Maternal Grandmother   . Hypertension Maternal Grandmother   . Breast cancer Maternal Uncle   . Diabetes Maternal Uncle     Social History   Socioeconomic History  . Marital status: Single    Spouse name: Not on file  . Number of children: 0  . Years of education: Not on file  . Highest education level: Professional school degree (e.g., MD, DDS, DVM, JD)  Occupational History  . Not on file  Tobacco Use  . Smoking status: Never Smoker  . Smokeless tobacco: Never Used  Vaping Use  . Vaping Use: Never used  Substance and Sexual Activity  . Alcohol use: No  . Drug use: Yes    Types: Marijuana  . Sexual activity: Yes     Birth control/protection: None  Other Topics Concern  . Not on file  Social History Narrative  . Not on file   Social Determinants of Health   Financial Resource Strain:   . Difficulty of Paying Living Expenses: Not on file  Food Insecurity:   . Worried About Programme researcher, broadcasting/film/videounning Out of Food in the Last Year: Not on file  . Ran Out of Food in the Last Year: Not on file  Transportation Needs: No Transportation Needs  . Lack of Transportation (Medical): No  . Lack of Transportation (Non-Medical): No  Physical Activity:   . Days of Exercise per Week: Not on file  . Minutes of Exercise per Session: Not on file  Stress:   . Feeling of Stress : Not on file  Social Connections:   . Frequency of Communication with Friends and Family: Not on file  . Frequency of Social Gatherings with Friends and Family: Not on file  . Attends Religious Services: Not on file  . Active Member of Clubs or Organizations: Not on file  . Attends BankerClub or Organization Meetings: Not on file  . Marital Status: Not on file  Intimate Partner Violence:   . Fear of Current or Ex-Partner: Not on file  . Emotionally Abused: Not on file  .  Physically Abused: Not on file  . Sexually Abused: Not on file    Outpatient Medications Prior to Visit  Medication Sig Dispense Refill  . cholecalciferol (VITAMIN D) 1000 units tablet Take 1 capsule by mouth daily.    . clindamycin (CLINDAGEL) 1 % gel Apply topically 2 (two) times daily. To help prevent skin infections 60 g 4  . cyclobenzaprine (FLEXERIL) 5 MG tablet Take 1 tablet (5 mg total) by mouth 3 (three) times daily as needed for muscle spasms. May take 2 at bedtime 30 tablet 1  . hydrochlorothiazide (HYDRODIURIL) 25 MG tablet Take 1 tablet (25 mg total) by mouth daily. Must have office visit for refills 30 tablet 0  . IRON PO Take 1 tablet by mouth 2 (two) times daily.    . metFORMIN (GLUCOPHAGE) 500 MG tablet Take 1 tablet (500 mg total) by mouth 3 (three) times daily. 90 tablet 0    . Multiple Vitamin (MULTIVITAMIN WITH MINERALS) TABS Take 1 tablet by mouth daily.    . naproxen (NAPROSYN) 500 MG tablet TAKE 1 TABLET BY MOUTH TWICE DAILY WITH MEALS AS NEEDED FOR PAIN 60 tablet 0  . doxycycline (VIBRA-TABS) 100 MG tablet Take 1 tablet (100 mg total) by mouth 2 (two) times daily. (Patient not taking: Reported on 11/15/2019) 20 tablet 1  . medroxyPROGESTERone (PROVERA) 10 MG tablet Take 1 tablet (10 mg total) by mouth daily. Use for ten days (Patient not taking: Reported on 04/11/2019) 10 tablet 2  . metroNIDAZOLE (FLAGYL) 500 MG tablet Take 1 tablet (500 mg total) by mouth 2 (two) times daily. (Patient not taking: Reported on 07/23/2020) 14 tablet 0   No facility-administered medications prior to visit.    Allergies  Allergen Reactions  . Amoxicillin Anaphylaxis    ROS Review of Systems  Constitutional: Negative for chills and fever.  HENT: Negative for sore throat and trouble swallowing.   Respiratory: Negative for cough and shortness of breath.   Cardiovascular: Negative for chest pain and palpitations.  Gastrointestinal: Negative for abdominal pain, constipation, diarrhea and nausea.  Endocrine: Negative for polydipsia, polyphagia and polyuria.  Genitourinary: Negative for dysuria, frequency and menstrual problem.  Musculoskeletal: Negative for arthralgias and back pain.       Has noticed that she cannot keep the fingers on her right hand together when she tries to fully extend her hand- no injury, no pain and no numbness  Skin: Negative for rash and wound.  Neurological: Negative for dizziness and headaches.  Hematological: Negative for adenopathy. Does not bruise/bleed easily.  Psychiatric/Behavioral: Negative for suicidal ideas. The patient is not nervous/anxious.       Objective:    Physical Exam Vitals and nursing note reviewed.  Constitutional:      General: She is not in acute distress.    Appearance: Normal appearance. She is obese.  Neck:      Comments: Patient with redundant skin/fatty tissue in her anterior neck area Cardiovascular:     Rate and Rhythm: Normal rate and regular rhythm.  Pulmonary:     Effort: Pulmonary effort is normal.     Breath sounds: Normal breath sounds.  Abdominal:     Palpations: Abdomen is soft.     Tenderness: There is no abdominal tenderness. There is no right CVA tenderness, left CVA tenderness, guarding or rebound.  Musculoskeletal:     Cervical back: Normal range of motion and neck supple. No tenderness.     Right lower leg: No edema.     Left  lower leg: No edema.     Comments: Right hand appears "skinnier" than the left and when patient tries to fully extend her right hand some of the fingers remain sepearated but not on the left' Left hand appears fuller/slightly puffier as compared to the right hand. No reproducible tenderness to either hand  Lymphadenopathy:     Cervical: No cervical adenopathy.  Skin:    General: Skin is warm and dry.  Neurological:     General: No focal deficit present.     Mental Status: She is alert and oriented to person, place, and time.  Psychiatric:        Mood and Affect: Mood normal.        Behavior: Behavior normal.     BP 130/88 (BP Location: Left Arm, Patient Position: Sitting)   Pulse 68   Wt (!) 321 lb 6.4 oz (145.8 kg)   LMP 07/06/2020 (Exact Date)   SpO2 100%   BMI 46.12 kg/m  Wt Readings from Last 3 Encounters:  07/23/20 (!) 321 lb 6.4 oz (145.8 kg)  07/14/20 (!) 323 lb 12.8 oz (146.9 kg)  02/06/20 (!) 323 lb 6.4 oz (146.7 kg)     Health Maintenance Due  Topic Date Due  . Hepatitis C Screening  Never done  . COVID-19 Vaccine (1) Never done  . INFLUENZA VACCINE  Never done   Patient was offered but declined influenza immunization at today's visit   Lab Results  Component Value Date   TSH 0.502 10/27/2017   Lab Results  Component Value Date   WBC 9.6 05/13/2012   HGB 11.6 (L) 05/13/2012   HCT 36.2 05/13/2012   MCV 88.3 05/13/2012    PLT 305 05/13/2012   Lab Results  Component Value Date   NA 140 04/18/2019   K 3.9 04/18/2019   CO2 25 04/18/2019   GLUCOSE 122 (H) 04/18/2019   BUN 9 04/18/2019   CREATININE 0.72 04/18/2019   CALCIUM 9.4 04/18/2019   Lab Results  Component Value Date   CHOL 134 11/15/2019   Lab Results  Component Value Date   HDL 34 (L) 11/15/2019   Lab Results  Component Value Date   LDLCALC 82 11/15/2019   Lab Results  Component Value Date   TRIG 92 11/15/2019   Lab Results  Component Value Date   CHOLHDL 3.9 11/15/2019   Lab Results  Component Value Date   HGBA1C 5.1 11/15/2019      Assessment & Plan:  1. Essential hypertension Blood pressure is currently controlled with the use of HCTZ which was refilled at today's visit.  - Basic Metabolic Panel - hydrochlorothiazide (HYDRODIURIL) 25 MG tablet; Take 1 tablet (25 mg total) by mouth daily.  Dispense: 90 tablet; Refill: 1  2. Secondary oligomenorrhea Patient has lost weight and continues to take metformin and is now having regular menses. Metformin refill provided and will check electrolytes/renal function via BMP.  - Basic Metabolic Panel - metFORMIN (GLUCOPHAGE) 500 MG tablet; Take 1 tablet (500 mg total) by mouth 3 (three) times daily.  Dispense: 270 tablet; Refill: 1  3. Encounter for long-term current use of medication Will obtain BMP to check electrolytes and renal function in follow-up of her use of HCTZ and metformin. Hgb A1c was HCTZ can increase the risk of diabetes.  - Basic Metabolic Panel - Hemoglobin A1c  4. Hand anomaly Patient has noticed that she cannot keep the fingers of her right hand fully extended and has issues keeping the fingers  on her right hand together when she is extending her fingers. She has had no pain/numbness and no injury to the hand. She has noticed this since losing weight. She currently works as a Automotive engineer for artificial nails and this is interfering with her work. She will be  referred to a hand surgeon for further evaluation and treatment.  - Ambulatory referral to Hand Surgery     Follow-up: Return in about 6 months (around 01/20/2021) for chronic issues- sooner if needed.   Cain Saupe, MD

## 2020-07-23 NOTE — Progress Notes (Signed)
HCTZ and Metformin needs refill

## 2020-07-24 LAB — BASIC METABOLIC PANEL WITH GFR
BUN/Creatinine Ratio: 12 (ref 9–23)
BUN: 7 mg/dL (ref 6–20)
CO2: 28 mmol/L (ref 20–29)
Calcium: 9.1 mg/dL (ref 8.7–10.2)
Chloride: 102 mmol/L (ref 96–106)
Creatinine, Ser: 0.6 mg/dL (ref 0.57–1.00)
GFR calc Af Amer: 137 mL/min/1.73
GFR calc non Af Amer: 118 mL/min/1.73
Glucose: 77 mg/dL (ref 65–99)
Potassium: 4 mmol/L (ref 3.5–5.2)
Sodium: 140 mmol/L (ref 134–144)

## 2020-07-24 LAB — HEMOGLOBIN A1C
Est. average glucose Bld gHb Est-mCnc: 100 mg/dL
Hgb A1c MFr Bld: 5.1 % (ref 4.8–5.6)

## 2020-09-29 DIAGNOSIS — Z1152 Encounter for screening for COVID-19: Secondary | ICD-10-CM | POA: Diagnosis not present

## 2021-02-12 ENCOUNTER — Other Ambulatory Visit: Payer: Self-pay

## 2021-02-22 ENCOUNTER — Other Ambulatory Visit: Payer: Self-pay | Admitting: Family Medicine

## 2021-02-22 DIAGNOSIS — I10 Essential (primary) hypertension: Secondary | ICD-10-CM

## 2021-02-22 NOTE — Telephone Encounter (Signed)
Medication Refill - Medication: hydrochlorothiazide (HYDRODIURIL) 25 MG tablet    Preferred Pharmacy (with phone number or street name): [id number] - [place], [place] - [address]  Agent: Please be advised that RX refills may take up to 3 business days. We ask that you follow-up with your pharmacy.

## 2021-02-22 NOTE — Telephone Encounter (Signed)
   Notes to clinic: Patient has appt on 04/27/2021  Review for enough medication until that time    Requested Prescriptions  Pending Prescriptions Disp Refills   hydrochlorothiazide (HYDRODIURIL) 25 MG tablet 90 tablet 1    Sig: Take 1 tablet (25 mg total) by mouth daily.      Cardiovascular: Diuretics - Thiazide Failed - 02/22/2021  2:04 PM      Failed - Valid encounter within last 6 months    Recent Outpatient Visits           7 months ago Essential hypertension   [facility] [place], [place], MD   1 year ago Annual physical exam   [facility] [place], [place], MD   1 year ago Dysmenorrhea   [facility]   1 year ago Recurrent boils   [facility] [place], [place], MD   1 year ago Acute pain of left knee   [facility] [place], [place], MD       Future Appointments             In 2 months Hoy Register, MD [facility]             Passed - Ca in normal range and within 360 days    Calcium  Date Value Ref Range Status  07/23/2020 9.1 8.7 - 10.2 mg/dL Final          Passed - Cr in normal range and within 360 days    Creatinine, Ser  Date Value Ref Range Status  07/23/2020 0.60 0.57 - 1.00 mg/dL Final          Passed - K in normal range and within 360 days    Potassium  Date Value Ref Range Status  07/23/2020 4.0 3.5 - 5.2 mmol/L Final          Passed - Na in normal range and within 360 days    Sodium  Date Value Ref Range Status  07/23/2020 140 134 - 144 mmol/L Final          Passed - Last BP in normal range    BP Readings from Last 1 Encounters:  07/23/20 130/88

## 2021-02-23 MED ORDER — HYDROCHLOROTHIAZIDE 25 MG PO TABS: 25.0000 mg | ORAL_TABLET | Freq: Every day | ORAL | 0 refills | Status: DC

## 2021-03-22 ENCOUNTER — Other Ambulatory Visit: Payer: Self-pay | Admitting: Family Medicine

## 2021-03-22 DIAGNOSIS — I10 Essential (primary) hypertension: Secondary | ICD-10-CM

## 2021-03-23 ENCOUNTER — Other Ambulatory Visit: Payer: Self-pay

## 2021-03-23 NOTE — Telephone Encounter (Signed)
  Notes to clinic:  Patient has appt on 04/27/2021 Review for refill until that time   Requested Prescriptions  Pending Prescriptions Disp Refills   hydrochlorothiazide (HYDRODIURIL) 25 MG tablet [Pharmacy Med Name: hydroCHLOROthiazide 25 MG Oral Tablet] 30 tablet 0    Sig: Take 1 tablet by mouth once daily      Cardiovascular: Diuretics - Thiazide Failed - 03/22/2021  4:36 PM      Failed - Valid encounter within last 6 months    Recent Outpatient Visits           8 months ago Essential hypertension   [facility] [place], [place], MD   1 year ago Annual physical exam   [facility] [place], [place], MD   1 year ago Dysmenorrhea   [facility]   1 year ago Recurrent boils   [facility] [place], [place], MD   1 year ago Acute pain of left knee   [facility] [place], [place], MD       Future Appointments             In 1 month Hoy Register, MD [facility]             Passed - Ca in normal range and within 360 days    Calcium  Date Value Ref Range Status  07/23/2020 9.1 8.7 - 10.2 mg/dL Final          Passed - Cr in normal range and within 360 days    Creatinine, Ser  Date Value Ref Range Status  07/23/2020 0.60 0.57 - 1.00 mg/dL Final          Passed - K in normal range and within 360 days    Potassium  Date Value Ref Range Status  07/23/2020 4.0 3.5 - 5.2 mmol/L Final          Passed - Na in normal range and within 360 days    Sodium  Date Value Ref Range Status  07/23/2020 140 134 - 144 mmol/L Final          Passed - Last BP in normal range    BP Readings from Last 1 Encounters:  07/23/20 130/88

## 2021-04-27 ENCOUNTER — Ambulatory Visit: Payer: Self-pay | Attending: Family Medicine | Admitting: Family Medicine

## 2021-04-27 ENCOUNTER — Other Ambulatory Visit: Payer: Self-pay

## 2021-04-27 ENCOUNTER — Encounter: Payer: Self-pay | Admitting: Family Medicine

## 2021-04-27 ENCOUNTER — Telehealth: Payer: Self-pay | Admitting: Family Medicine

## 2021-04-27 DIAGNOSIS — Z1159 Encounter for screening for other viral diseases: Secondary | ICD-10-CM

## 2021-04-27 DIAGNOSIS — N970 Female infertility associated with anovulation: Secondary | ICD-10-CM

## 2021-04-27 DIAGNOSIS — I10 Essential (primary) hypertension: Secondary | ICD-10-CM

## 2021-04-27 DIAGNOSIS — F4310 Post-traumatic stress disorder, unspecified: Secondary | ICD-10-CM

## 2021-04-27 MED ORDER — METFORMIN HCL 500 MG PO TABS: 500.0000 mg | ORAL_TABLET | Freq: Two times a day (BID) | ORAL | 1 refills | Status: DC

## 2021-04-27 MED ORDER — HYDROCHLOROTHIAZIDE 25 MG PO TABS: 25.0000 mg | ORAL_TABLET | Freq: Every day | ORAL | 6 refills | Status: DC

## 2021-04-27 NOTE — Telephone Encounter (Signed)
Patient would like counseling due to history of PTSD.
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
## 2021-04-28 NOTE — Addendum Note (Signed)
Addended byMemory Dance on: 04/28/2021 08:49 AM   Modules accepted: Orders

## 2021-04-30 LAB — CMP14+EGFR
ALT: 12 IU/L (ref 0–32)
AST: 11 IU/L (ref 0–40)
Albumin/Globulin Ratio: 1.1 — ABNORMAL LOW (ref 1.2–2.2)
Albumin: 3.9 g/dL (ref 3.8–4.8)
Alkaline Phosphatase: 56 IU/L (ref 44–121)
BUN/Creatinine Ratio: 12 (ref 9–23)
BUN: 8 mg/dL (ref 6–20)
Bilirubin Total: 0.4 mg/dL (ref 0.0–1.2)
CO2: 22 mmol/L (ref 20–29)
Calcium: 9.3 mg/dL (ref 8.7–10.2)
Chloride: 99 mmol/L (ref 96–106)
Creatinine, Ser: 0.65 mg/dL (ref 0.57–1.00)
Globulin, Total: 3.6 g/dL (ref 1.5–4.5)
Glucose: 80 mg/dL (ref 65–99)
Potassium: 3.5 mmol/L (ref 3.5–5.2)
Sodium: 138 mmol/L (ref 134–144)
Total Protein: 7.5 g/dL (ref 6.0–8.5)
eGFR: 117 mL/min/{1.73_m2} (ref >59)

## 2021-04-30 LAB — LIPID PANEL
Chol/HDL Ratio: 4.2 ratio (ref 0.0–4.4)
Cholesterol, Total: 151 mg/dL (ref 100–199)
HDL: 36 mg/dL — ABNORMAL LOW (ref >39)
LDL Chol Calc (NIH): 99 mg/dL (ref 0–99)
Triglycerides: 81 mg/dL (ref 0–149)
VLDL Cholesterol Cal: 16 mg/dL (ref 5–40)

## 2021-04-30 LAB — HEMOGLOBIN A1C
Est. average glucose Bld gHb Est-mCnc: 97 mg/dL
Hgb A1c MFr Bld: 5 % (ref 4.8–5.6)

## 2021-04-30 LAB — FSH/LH
FSH: 3.4 m[IU]/mL
LH: 4.2 m[IU]/mL

## 2021-04-30 LAB — HCV RNA QUANT RFLX ULTRA OR GENOTYP: HCV Quant Baseline: NOT DETECTED IU/mL

## 2021-04-30 LAB — TESTOSTERONE: Testosterone: 14 ng/dL (ref 8–60)

## 2021-04-30 NOTE — Telephone Encounter (Signed)
Spoke with pt and scheduled appt for 05/19/21 at 10:00am

## 2021-05-19 ENCOUNTER — Ambulatory Visit: Payer: Self-pay | Attending: Family Medicine | Admitting: Clinical

## 2021-05-19 ENCOUNTER — Encounter (INDEPENDENT_AMBULATORY_CARE_PROVIDER_SITE_OTHER): Payer: Self-pay

## 2021-05-19 ENCOUNTER — Other Ambulatory Visit: Payer: Self-pay

## 2021-05-19 DIAGNOSIS — F331 Major depressive disorder, recurrent, moderate: Secondary | ICD-10-CM

## 2021-05-19 DIAGNOSIS — F4310 Post-traumatic stress disorder, unspecified: Secondary | ICD-10-CM
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
## 2021-06-03 ENCOUNTER — Ambulatory Visit (INDEPENDENT_AMBULATORY_CARE_PROVIDER_SITE_OTHER): Payer: Self-pay | Admitting: Clinical

## 2021-06-03 ENCOUNTER — Other Ambulatory Visit: Payer: Self-pay

## 2021-06-03 DIAGNOSIS — F4310 Post-traumatic stress disorder, unspecified: Secondary | ICD-10-CM

## 2021-06-03 DIAGNOSIS — F331 Major depressive disorder, recurrent, moderate: Secondary | ICD-10-CM
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
## 2021-06-24 ENCOUNTER — Ambulatory Visit (INDEPENDENT_AMBULATORY_CARE_PROVIDER_SITE_OTHER): Payer: Self-pay | Admitting: Clinical

## 2021-06-24 ENCOUNTER — Other Ambulatory Visit: Payer: Self-pay

## 2021-06-24 DIAGNOSIS — F4310 Post-traumatic stress disorder, unspecified: Secondary | ICD-10-CM

## 2021-06-24 DIAGNOSIS — F331 Major depressive disorder, recurrent, moderate: Secondary | ICD-10-CM
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
## 2021-07-08 ENCOUNTER — Other Ambulatory Visit: Payer: Self-pay

## 2021-07-08 ENCOUNTER — Ambulatory Visit (INDEPENDENT_AMBULATORY_CARE_PROVIDER_SITE_OTHER): Payer: Self-pay | Admitting: Clinical

## 2021-07-08 DIAGNOSIS — F4310 Post-traumatic stress disorder, unspecified: Secondary | ICD-10-CM

## 2021-07-08 DIAGNOSIS — F331 Major depressive disorder, recurrent, moderate: Secondary | ICD-10-CM
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
## 2021-07-22 ENCOUNTER — Other Ambulatory Visit: Payer: Self-pay

## 2021-07-22 ENCOUNTER — Ambulatory Visit (INDEPENDENT_AMBULATORY_CARE_PROVIDER_SITE_OTHER): Payer: Self-pay | Admitting: Clinical

## 2021-07-22 DIAGNOSIS — F4310 Post-traumatic stress disorder, unspecified: Secondary | ICD-10-CM

## 2021-07-22 DIAGNOSIS — F331 Major depressive disorder, recurrent, moderate: Secondary | ICD-10-CM
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
## 2021-08-05 ENCOUNTER — Ambulatory Visit (INDEPENDENT_AMBULATORY_CARE_PROVIDER_SITE_OTHER): Payer: Self-pay | Admitting: Clinical

## 2021-08-05 ENCOUNTER — Other Ambulatory Visit: Payer: Self-pay

## 2021-08-05 DIAGNOSIS — F4310 Post-traumatic stress disorder, unspecified: Secondary | ICD-10-CM

## 2021-08-05 DIAGNOSIS — F331 Major depressive disorder, recurrent, moderate: Secondary | ICD-10-CM
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
## 2021-08-19 ENCOUNTER — Other Ambulatory Visit: Payer: Self-pay

## 2021-08-19 ENCOUNTER — Encounter (INDEPENDENT_AMBULATORY_CARE_PROVIDER_SITE_OTHER): Payer: Self-pay

## 2021-08-19 ENCOUNTER — Ambulatory Visit (INDEPENDENT_AMBULATORY_CARE_PROVIDER_SITE_OTHER): Payer: Self-pay | Admitting: Clinical

## 2021-08-19 DIAGNOSIS — F4310 Post-traumatic stress disorder, unspecified: Secondary | ICD-10-CM

## 2021-08-19 DIAGNOSIS — F1210 Cannabis abuse, uncomplicated: Secondary | ICD-10-CM
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
## 2021-08-23 DIAGNOSIS — F4310 Post-traumatic stress disorder, unspecified: Secondary | ICD-10-CM | POA: Insufficient documentation

## 2021-08-23 DIAGNOSIS — F1210 Cannabis abuse, uncomplicated: Secondary | ICD-10-CM | POA: Insufficient documentation

## 2021-09-02 ENCOUNTER — Ambulatory Visit (INDEPENDENT_AMBULATORY_CARE_PROVIDER_SITE_OTHER): Payer: Self-pay | Admitting: Clinical

## 2021-09-02 ENCOUNTER — Other Ambulatory Visit: Payer: Self-pay

## 2021-09-02 DIAGNOSIS — F1210 Cannabis abuse, uncomplicated: Secondary | ICD-10-CM

## 2021-09-02 DIAGNOSIS — F4310 Post-traumatic stress disorder, unspecified: Secondary | ICD-10-CM

## 2021-09-14 ENCOUNTER — Other Ambulatory Visit: Payer: Self-pay | Admitting: Family Medicine

## 2021-09-14 DIAGNOSIS — L0293 Carbuncle, unspecified: Secondary | ICD-10-CM

## 2021-09-14 NOTE — Telephone Encounter (Signed)
Requested medication (s) are due for refill today: yes  Requested medication (s) are on the active medication list: yes  Last refill:  10/02/19 with 4 refills  Future visit scheduled: yes  Notes to clinic:  Please review for refill. Refill not delegated per protocol.    Requested Prescriptions  Pending Prescriptions Disp Refills   clindamycin (CLINDAGEL) 1 % gel 60 g 4    Sig: Apply topically 2 (two) times daily. To help prevent skin infections     Off-Protocol Failed - 09/14/2021  4:41 PM      Failed - Medication not assigned to a protocol, review manually.      Passed - Valid encounter within last 12 months    Recent Outpatient Visits           4 months ago PTSD (post-traumatic stress disorder)   [facility] Hoy Register, MD   1 year ago Essential hypertension   [facility] Fulp, [place]fr[place], MD   1 year ago Annual physical exam   [facility] Fulp, [place], MD   1 year ago Dysmenorrhea    [facility]   1 year ago Recurrent boils   [facility] [place]ly, [place], MD

## 2021-09-14 NOTE — Telephone Encounter (Signed)
Copied from CRM [id number]. Topic: Quick Communication - Rx Refill/Question >> Sep 14, 2021  1:32 PM Marylen Ponto wrote: Medication: clindamycin (CLINDAGEL) 1 % gel  Has the patient contacted their pharmacy? No. Pt requesting Rx to be sent to new pharmacy (Agent: If no, request that the patient contact the pharmacy for the refill. If patient does not wish to contact the pharmacy document the reason why and proceed with request.) (Agent: If yes, when and what did the pharmacy advise?)  Preferred Pharmacy (with phone number or street name): [id number] - [place], [place] - [address]  Phone: [phone] Fax: [phone]  Has the patient been seen for an appointment in the last year OR does the patient have an upcoming appointment? Yes.    Agent: Please be advised that RX refills may take up to 3 business days. We ask that you follow-up with your pharmacy.
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
## 2021-09-22 ENCOUNTER — Ambulatory Visit (INDEPENDENT_AMBULATORY_CARE_PROVIDER_SITE_OTHER): Payer: Self-pay | Admitting: *Deleted

## 2021-09-22 ENCOUNTER — Encounter: Payer: Self-pay | Admitting: *Deleted

## 2021-09-22 NOTE — Telephone Encounter (Signed)
Patient needs an appt for evaluation.  Also advised to f/u with [facility].    LVM to return call.

## 2021-09-22 NOTE — Telephone Encounter (Signed)
Patient returned call and reports someone left her a message to call back . Reviewed recent encounters  and triage by another RN  earlier today and reviewed message from T. Sharlet Salina, RN that patient needs appt for evaluation and to f/u with [facility]. Patient verbalized she wanted medication now due to irritation of vagina and has been waiting for medication to help her since 09/09/21. Patient verbalized she was not satisfied with waiting for a response from the PCP. Offered appt for 10/14/21 and patient declined. Recommended  UC or ED or E visit via My Chart  but reported providers will want to evaluate her physically. Patient would like to speak with someone other than NT at this time. Gave patient # to Patient Experience [phone]. Please advise.
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
## 2021-09-22 NOTE — Telephone Encounter (Signed)
This encounter was created in error - please disregard.

## 2021-09-22 NOTE — Telephone Encounter (Signed)
Patient states clindamycin (CLINDAGEL) 1 % gel is not working and states she would like PCP to send in antibiotics due to HS flare up, patient states its stage 3 based of what she has read online.   medication HS bad flare up, stage 3    Attempted to reach pt, left VM to call back to discuss symptoms.

## 2021-09-23 ENCOUNTER — Other Ambulatory Visit: Payer: Self-pay

## 2021-09-23 ENCOUNTER — Ambulatory Visit (INDEPENDENT_AMBULATORY_CARE_PROVIDER_SITE_OTHER): Payer: Self-pay | Admitting: Clinical

## 2021-09-23 DIAGNOSIS — F4310 Post-traumatic stress disorder, unspecified: Secondary | ICD-10-CM

## 2021-09-23 DIAGNOSIS — F1210 Cannabis abuse, uncomplicated: Secondary | ICD-10-CM
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
## 2021-09-28 ENCOUNTER — Encounter: Payer: Self-pay | Admitting: Nurse Practitioner

## 2021-09-28 ENCOUNTER — Encounter: Payer: Self-pay | Admitting: *Deleted

## 2021-09-28 ENCOUNTER — Other Ambulatory Visit: Payer: Self-pay

## 2021-09-28 ENCOUNTER — Ambulatory Visit (INDEPENDENT_AMBULATORY_CARE_PROVIDER_SITE_OTHER): Payer: Self-pay | Admitting: Nurse Practitioner

## 2021-09-28 DIAGNOSIS — L732 Hidradenitis suppurativa: Secondary | ICD-10-CM

## 2021-09-28 DIAGNOSIS — L0293 Carbuncle, unspecified: Secondary | ICD-10-CM

## 2021-09-28 MED ORDER — CLINDAMYCIN PHOSPHATE 1 % EX GEL: Freq: Two times a day (BID) | CUTANEOUS | 0 refills | Status: DC

## 2021-09-28 MED ORDER — DOXYCYCLINE HYCLATE 100 MG PO TABS: 100.0000 mg | ORAL_TABLET | Freq: Two times a day (BID) | ORAL | 0 refills | Status: AC

## 2021-09-28 MED ORDER — PREDNISONE 20 MG PO TABS: 20.0000 mg | ORAL_TABLET | Freq: Every day | ORAL | 0 refills | Status: AC

## 2021-09-28 NOTE — Patient Instructions (Addendum)
Hidradenitis Suppurativa:  Continue clindagel as needed  Will order doxycycline  Keep areas clean and dry  Will order prednisone  Follow up:  Follow up with PCP in 2 weeks   Hidradenitis Suppurativa Hidradenitis suppurativa is a long-term (chronic) skin disease. It is similar to a severe form of acne, but it affects areas of the body where acne would be unusual, especially areas of the body where skin rubs against skin and becomes moist. These include: Underarms. Groin. Genital area. Buttocks. Upper thighs. Breasts. Hidradenitis suppurativa may start out as small lumps or pimples caused by blocked sweat glands or hair follicles. Pimples may develop into deep sores that break open (rupture) and drain pus. Over time, affected areas of skin may thicken and become scarred. This condition is rare and does not spread from person to person (non-contagious). What are the causes? The exact cause of this condition is not known. It may be related to: Female and female hormones. An overactive disease-fighting system (immune system). The immune system may over-react to blocked hair follicles or sweat glands and cause swelling and pus-filled sores. What increases the risk? You are more likely to develop this condition if you: Are female. Are 23-80 years old. Have a family history of hidradenitis suppurativa. Have a personal history of acne. Are overweight. Smoke. Take the medicine lithium. What are the signs or symptoms? The first symptoms are usually painful bumps in the skin, similar to pimples. The condition may get worse over time (progress), or it may only cause mild symptoms. If the disease progresses, symptoms may include: Skin bumps getting bigger and growing deeper into the skin. Bumps rupturing and draining pus. Itchy, infected skin. Skin getting thicker and scarred. Tunnels under the skin (fistulas) where pus drains from a bump. Pain during daily activities, such as pain during  walking if your groin area is affected. Emotional problems, such as stress or depression. This condition may affect your appearance and your ability or willingness to wear certain clothes or do certain activities. How is this diagnosed? This condition is diagnosed by a health care provider who specializes in skin diseases (dermatologist). You may be diagnosed based on: Your symptoms and medical history. A physical exam. Testing a pus sample for infection. Blood tests. How is this treated? Your treatment will depend on how severe your symptoms are. The same treatment will not work for everybody with this condition. You may need to try several treatments to find what works best for you. Treatment may include: Cleaning and bandaging (dressing) your wounds as needed. Lifestyle changes, such as new skin care routines. Taking medicines, such as: Antibiotics. Acne medicines. Medicines to reduce the activity of the immune system. A diabetes medicine (metformin). Birth control pills, for women. Steroids to reduce swelling and pain. Working with a mental health care provider, if you experience emotional distress due to this condition. If you have severe symptoms that do not get better with medicine, you may need surgery. Surgery may involve: Using a laser to clear the skin and remove hair follicles. Opening and draining deep sores. Removing the areas of skin that are diseased and scarred. Follow these instructions at home: Medicines  Take over-the-counter and prescription medicines only as told by your health care provider. If you were prescribed an antibiotic medicine, take it as told by your health care provider. Do not stop taking the antibiotic even if your condition improves. Skin care If you have open wounds, cover them with a clean dressing as told by  your health care provider. Keep wounds clean by washing them gently with soap and water when you bathe. Do not shave the areas where you  get hidradenitis suppurativa. Do not wear deodorant. Wear loose-fitting clothes. Try to avoid getting overheated or sweaty. If you get sweaty or wet, change into clean, dry clothes as soon as you can. To help relieve pain and itchiness, cover sore areas with a warm, clean washcloth (warm compress) for 5-10 minutes as often as needed. If told by your health care provider, take a bleach bath twice a week: Fill your bathtub halfway with water. Pour in  cup of unscented household bleach. Soak in the tub for 5-10 minutes. Only soak from the neck down. Avoid water on your face and hair. Shower to rinse off the bleach from your skin. General instructions Learn as much as you can about your disease so that you have an active role in your treatment. Work closely with your health care provider to find treatments that work for you. If you are overweight, work with your health care provider to lose weight as recommended. Do not use any products that contain nicotine or tobacco, such as cigarettes and e-cigarettes. If you need help quitting, ask your health care provider. If you struggle with living with this condition, talk with your health care provider or work with a mental health care provider as recommended. Keep all follow-up visits as told by your health care provider. This is important. Where to find more information Hidradenitis Suppurativa Foundation, Inc.: https://www.hs-foundation.org/ American Academy of Dermatology: Instant[employer]fi Contact a health care provider if you have: A flare-up of hidradenitis suppurativa. A fever or chills. Trouble controlling your symptoms at home. Trouble doing your daily activities because of your symptoms. Trouble dealing with emotional problems related to your condition. Summary Hidradenitis suppurativa is a long-term (chronic) skin disease. It is similar to a severe form of acne, but it affects areas of the body where acne would be unusual. The first  symptoms are usually painful bumps in the skin, similar to pimples. The condition may only cause mild symptoms, or it may get worse over time (progress). If you have open wounds, cover them with a clean dressing as told by your health care provider. Keep wounds clean by washing them gently with soap and water when you bathe. Besides skin care, treatment may include medicines, laser treatment, and surgery. This information is not intended to replace advice given to you by your health care provider. Make sure you discuss any questions you have with your health care provider. Document Revised: 06/30/2020 Document Reviewed: 06/30/2020 Elsevier Patient Education  2022 [employer].

## 2021-09-28 NOTE — Assessment & Plan Note (Signed)
Hidradenitis Suppurativa:  Continue clindagel as needed  Will order doxycycline  Keep areas clean and dry  Will order prednisone  Follow up:  Follow up with PCP in 2 weeks
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
## 2021-09-28 NOTE — Telephone Encounter (Signed)
Patient declined the [facility], UC, or ED.  Scheduled an appt with PCE today.

## 2021-09-30 ENCOUNTER — Other Ambulatory Visit: Payer: Self-pay

## 2021-09-30 ENCOUNTER — Ambulatory Visit (INDEPENDENT_AMBULATORY_CARE_PROVIDER_SITE_OTHER): Payer: Self-pay | Admitting: Clinical

## 2021-09-30 DIAGNOSIS — F4310 Post-traumatic stress disorder, unspecified: Secondary | ICD-10-CM

## 2021-09-30 DIAGNOSIS — F1210 Cannabis abuse, uncomplicated: Secondary | ICD-10-CM
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
## 2021-10-14 ENCOUNTER — Other Ambulatory Visit: Payer: Self-pay

## 2021-10-14 ENCOUNTER — Ambulatory Visit (INDEPENDENT_AMBULATORY_CARE_PROVIDER_SITE_OTHER): Payer: Self-pay | Admitting: Clinical

## 2021-10-14 DIAGNOSIS — F4310 Post-traumatic stress disorder, unspecified: Secondary | ICD-10-CM

## 2021-10-14 DIAGNOSIS — F1210 Cannabis abuse, uncomplicated: Secondary | ICD-10-CM

## 2021-10-19 ENCOUNTER — Other Ambulatory Visit: Payer: Self-pay

## 2021-10-19 ENCOUNTER — Encounter: Payer: Self-pay | Admitting: Family Medicine

## 2021-10-19 ENCOUNTER — Ambulatory Visit: Payer: Medicaid Other | Attending: Family Medicine | Admitting: Family Medicine

## 2021-10-19 ENCOUNTER — Other Ambulatory Visit: Payer: Self-pay | Admitting: Family Medicine

## 2021-10-19 VITALS — BP 116/75 | HR 77 | Ht 70.0 in | Wt 290.4 lb

## 2021-10-19 DIAGNOSIS — N970 Female infertility associated with anovulation: Secondary | ICD-10-CM

## 2021-10-19 DIAGNOSIS — L0292 Furuncle, unspecified: Secondary | ICD-10-CM

## 2021-10-19 DIAGNOSIS — I10 Essential (primary) hypertension: Secondary | ICD-10-CM | POA: Diagnosis not present

## 2021-10-19 DIAGNOSIS — M79642 Pain in left hand: Secondary | ICD-10-CM

## 2021-10-19 DIAGNOSIS — Z1159 Encounter for screening for other viral diseases: Secondary | ICD-10-CM | POA: Diagnosis not present

## 2021-10-19 DIAGNOSIS — M79641 Pain in right hand: Secondary | ICD-10-CM

## 2021-10-19 MED ORDER — DOXYCYCLINE HYCLATE 100 MG PO TABS: 100.0000 mg | ORAL_TABLET | Freq: Two times a day (BID) | ORAL | 0 refills | Status: DC

## 2021-10-19 MED ORDER — HYDROCHLOROTHIAZIDE 25 MG PO TABS: 25.0000 mg | ORAL_TABLET | Freq: Every day | ORAL | 1 refills | Status: DC

## 2021-10-19 MED ORDER — METFORMIN HCL 500 MG PO TABS: 500.0000 mg | ORAL_TABLET | Freq: Two times a day (BID) | ORAL | 1 refills | Status: DC

## 2021-10-19 MED ORDER — FLUCONAZOLE 150 MG PO TABS: 150.0000 mg | ORAL_TABLET | Freq: Once | ORAL | 0 refills | Status: AC

## 2021-10-19 NOTE — Progress Notes (Signed)
Subjective:  Patient ID: Ashlee Fox, female    DOB: 02-Sep-1986  Age: 36 y.o. MRN: [id number]  CC: Hypertension   HPI Ashlee Fox is a 36 y.o. year old female with a history of hypertension, hidradenitis seen for an office visit.  Interval History: She was seen by the physician assistant due to presence of boils in her groin after she had waxing of her bikini area 2 months ago.  She was treated with doxycycline and prednisone. She states the size decreased but lesions are still present..  She has lost over 100 lbs; she works out 5 x/week. Endorses compliance with her antihypertensive and is also doing well on Metformin  She is requesting referral to a hand specialist due to deformities of hand as she has noticed some flexion deformities and her right hand is smaller than her left with associated paresthesia in her right fifth finger.  This has been present for a while but she never had insurance to go see a specialist.  Her insurance kicks in tomorrow. Past Medical History:  Diagnosis Date   Anemia    Complication of anesthesia    Hx MRSA infection    Morbid obesity ([place]) 10/27/2017    Past Surgical History:  Procedure Laterality Date   TONSILLECTOMY      Family History  Problem Relation Age of Onset   Diabetes Maternal Grandmother    Hypertension Maternal Grandmother    Breast cancer Maternal Uncle    Diabetes Maternal Uncle     Allergies  Allergen Reactions   Amoxicillin Anaphylaxis    Outpatient Medications Prior to Visit  Medication Sig Dispense Refill   cholecalciferol (VITAMIN D) 1000 units tablet Take 1 capsule by mouth daily.     clindamycin (CLINDAGEL) 1 % gel Apply topically 2 (two) times daily. To help prevent skin infections 30 g 0   IRON PO Take 1 tablet by mouth 2 (two) times daily.     Multiple Vitamin (MULTIVITAMIN WITH MINERALS) TABS Take 1 tablet by mouth daily.     naproxen (NAPROSYN) 500 MG tablet TAKE 1 TABLET BY MOUTH TWICE DAILY WITH MEALS  AS NEEDED FOR PAIN 60 tablet 0   cyclobenzaprine (FLEXERIL) 5 MG tablet Take 1 tablet (5 mg total) by mouth 3 (three) times daily as needed for muscle spasms. May take 2 at bedtime 30 tablet 1   hydrochlorothiazide (HYDRODIURIL) 25 MG tablet Take 1 tablet (25 mg total) by mouth daily. 30 tablet 6   metFORMIN (GLUCOPHAGE) 500 MG tablet Take 1 tablet (500 mg total) by mouth 2 (two) times daily with a meal. 180 tablet 1   No facility-administered medications prior to visit.     ROS Review of Systems  Constitutional:  Negative for activity change, appetite change and fatigue.  HENT:  Negative for congestion, sinus pressure and sore throat.   Eyes:  Negative for visual disturbance.  Respiratory:  Negative for cough, chest tightness, shortness of breath and wheezing.   Cardiovascular:  Negative for chest pain and palpitations.  Gastrointestinal:  Negative for abdominal distention, abdominal pain and constipation.  Endocrine: Negative for polydipsia.  Genitourinary:  Negative for dysuria and frequency.  Musculoskeletal:  Negative for arthralgias and back pain.  Skin:  Positive for rash.  Neurological:  Negative for tremors, light-headedness and numbness.  Hematological:  Does not bruise/bleed easily.  Psychiatric/Behavioral:  Negative for agitation and behavioral problems.    Objective:  BP 116/75    Pulse 77    Ht  5\' 10"  (1.778 m)    Wt 290 lb 6.4 oz (131.7 kg)    SpO2 100%    BMI 41.67 kg/m   BP/Weight 10/19/2021 09/28/2021 [id number]  Systolic BP [id number]  Diastolic BP 75 83 88  Wt. (Lbs) 290.4 283.5 321.4  BMI 41.67 40.68 46.12      Physical Exam Constitutional:      Appearance: She is well-developed. She is obese.  Cardiovascular:     Rate and Rhythm: Normal rate.     Heart sounds: Normal heart sounds. No murmur heard. Pulmonary:     Effort: Pulmonary effort is normal.     Breath sounds: Normal breath sounds. No wheezing or rales.  Chest:     Chest wall: No tenderness.   Abdominal:     General: Bowel sounds are normal. There is no distension.     Palpations: Abdomen is soft. There is no mass.     Tenderness: There is no abdominal tenderness.  Musculoskeletal:        General: Normal range of motion.     Right lower leg: No edema.     Left lower leg: No edema.     Comments: Flexion deformity of right fourth finger Hypoesthesia of medial aspect of right hand  Skin:    Comments: Furuncles widely distributed in the perineum extending to the medial aspect of the gluteus muscle with associated postinflammatory hyperpigmentation.  No discharge from any of the lesion.  Neurological:     Mental Status: She is alert and oriented to person, place, and time.  Psychiatric:        Mood and Affect: Mood normal.    CMP Latest Ref Rng & Units 04/28/2021 07/23/2020 04/18/2019  Glucose 65 - 99 mg/dL 80 77 122(H)  BUN 6 - 20 mg/dL 8 7 9   Creatinine 0.57 - 1.00 mg/dL 0.65 0.60 0.72  Sodium 134 - 144 mmol/L 138 140 140  Potassium 3.5 - 5.2 mmol/L 3.5 4.0 3.9  Chloride 96 - 106 mmol/L 99 102 100  CO2 20 - 29 mmol/L 22 28 25   Calcium 8.7 - 10.2 mg/dL 9.3 9.1 9.4  Total Protein 6.0 - 8.5 g/dL 7.5 - -  Total Bilirubin 0.0 - 1.2 mg/dL 0.4 - -  Alkaline Phos 44 - 121 IU/L 56 - -  AST 0 - 40 IU/L 11 - -  ALT 0 - 32 IU/L 12 - -    Lipid Panel     Component Value Date/Time   CHOL 151 04/28/2021 0851   TRIG 81 04/28/2021 0851   HDL 36 (L) 04/28/2021 0851   CHOLHDL 4.2 04/28/2021 0851   LDLCALC 99 04/28/2021 0851    CBC    Component Value Date/Time   WBC 9.6 05/13/2012 1515   RBC 4.10 05/13/2012 1515   HGB 11.6 (L) 05/13/2012 1515   HCT 36.2 05/13/2012 1515   PLT 305 05/13/2012 1515   MCV 88.3 05/13/2012 1515   MCH 28.3 05/13/2012 1515   MCHC 32.0 05/13/2012 1515   RDW 13.6 05/13/2012 1515   LYMPHSABS 3.7 05/13/2012 1515   MONOABS 0.7 05/13/2012 1515   EOSABS 0.1 05/13/2012 1515   BASOSABS 0.0 05/13/2012 1515    Lab Results  Component Value Date   HGBA1C  5.0 04/28/2021    Assessment & Plan:  1. Essential hypertension Controlled Counseled on blood pressure goal of less than 130/80, low-sodium, DASH diet, medication compliance, 150 minutes of moderate intensity exercise per week. Discussed medication compliance, adverse effects. -  Basic Metabolic Panel - hydrochlorothiazide (HYDRODIURIL) 25 MG tablet; Take 1 tablet (25 mg total) by mouth daily.  Dispense: 90 tablet; Refill: 1  2. Secondary anovulatory infertility Stable - metFORMIN (GLUCOPHAGE) 500 MG tablet; Take 1 tablet (500 mg total) by mouth 2 (two) times daily with a meal.  Dispense: 180 tablet; Refill: 1  3. Pain in both hands Suspicious for right ulnar neuropathy - AMB referral to orthopedics  4. 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  Dispense: 20 tablet; Refill: 0  5. Screening for viral disease - HCV Ab w Reflex to Quant PCR    Meds ordered this encounter  Medications   hydrochlorothiazide (HYDRODIURIL) 25 MG tablet    Sig: Take 1 tablet (25 mg total) by mouth daily.    Dispense:  90 tablet    Refill:  1   metFORMIN (GLUCOPHAGE) 500 MG tablet    Sig: Take 1 tablet (500 mg total) by mouth 2 (two) times daily with a meal.    Dispense:  180 tablet    Refill:  1   doxycycline (VIBRA-TABS) 100 MG tablet    Sig: Take 1 tablet (100 mg total) by mouth 2 (two) times daily.    Dispense:  20 tablet    Refill:  0    Follow-up: Return in about 6 months (around 04/18/2022) for Chronic medical conditions.       Charlott Rakes, MD, FAAFP. [facility] [place]   10/19/2021, 12:18 PM

## 2021-10-19 NOTE — Progress Notes (Signed)
Still has some spots in vaginal area.

## 2021-10-20 LAB — BASIC METABOLIC PANEL
BUN/Creatinine Ratio: 14 (ref 9–23)
BUN: 8 mg/dL (ref 6–20)
CO2: 21 mmol/L (ref 20–29)
Calcium: 9.2 mg/dL (ref 8.7–10.2)
Chloride: 104 mmol/L (ref 96–106)
Creatinine, Ser: 0.56 mg/dL — ABNORMAL LOW (ref 0.57–1.00)
Glucose: 74 mg/dL (ref 70–99)
Potassium: 3.8 mmol/L (ref 3.5–5.2)
Sodium: 141 mmol/L (ref 134–144)
eGFR: 121 mL/min/{1.73_m2} (ref >59)

## 2021-10-20 LAB — HCV AB W REFLEX TO QUANT PCR: HCV Ab: <0.1 s/co ratio (ref 0.0–0.9)

## 2021-10-20 LAB — HCV INTERPRETATION
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
## 2021-10-28 ENCOUNTER — Ambulatory Visit (INDEPENDENT_AMBULATORY_CARE_PROVIDER_SITE_OTHER): Payer: 59 | Admitting: Clinical

## 2021-10-28 ENCOUNTER — Other Ambulatory Visit: Payer: Self-pay

## 2021-10-28 DIAGNOSIS — F4310 Post-traumatic stress disorder, unspecified: Secondary | ICD-10-CM | POA: Diagnosis not present

## 2021-10-28 DIAGNOSIS — F1210 Cannabis abuse, uncomplicated: Secondary | ICD-10-CM
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
## 2021-11-05 ENCOUNTER — Ambulatory Visit (INDEPENDENT_AMBULATORY_CARE_PROVIDER_SITE_OTHER): Payer: 59 | Admitting: Orthopedic Surgery

## 2021-11-05 ENCOUNTER — Other Ambulatory Visit: Payer: Self-pay

## 2021-11-05 ENCOUNTER — Encounter: Payer: Self-pay | Admitting: Orthopedic Surgery

## 2021-11-05 ENCOUNTER — Ambulatory Visit (INDEPENDENT_AMBULATORY_CARE_PROVIDER_SITE_OTHER): Payer: 59

## 2021-11-05 VITALS — Ht 70.0 in | Wt 283.0 lb

## 2021-11-05 DIAGNOSIS — M79641 Pain in right hand: Secondary | ICD-10-CM

## 2021-11-05 DIAGNOSIS — G5621 Lesion of ulnar nerve, right upper limb: Secondary | ICD-10-CM | POA: Diagnosis not present

## 2021-11-05 NOTE — Progress Notes (Signed)
Office Visit Note   Patient: Ashlee Fox           Date of Birth: 1985/12/11           MRN: [id number] Visit Date: 11/05/2021              Requested by: Hoy Register, MD [address] [place],  [place] [address] PCP: Hoy Register, MD   Assessment & Plan: Visit Diagnoses:  1. Pain in right hand   2. 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  This seems to be a chronic issue as she noticed wasting at least 2 years ago.  She has severe wasting of the intrinsic muscles with clawing of the ring finger and Wartenberg's sign of the small finger, and no two-point discrimination of the small finger.  We will get an EMG/nerve conduction study of both extremities.  She has some provocative signs concerning for carpal tunnel syndrome on the see her back in the office after the nerve conduction study/EMG is completed.  Follow-Up Instructions: No follow-ups on file.   Orders:  Orders Placed This Encounter  Procedures   XR Hand Complete Right   Ambulatory referral to Physical Medicine Rehab   No orders of the defined types were placed in this encounter.     Procedures: No procedures performed   Clinical Data: No additional findings.   Subjective: Chief Complaint  Patient presents with   Right Hand - Pain    This is a 36 year old right-hand-dominant female who presents with right hand numbness.  She notes that she has no sensation in her right small finger.  She is injured small finger multiple times due to lack of sensation.  She also has hyperextension at the ring finger MP joint with resulting flexion at the PIP joint.  This been going on for unknown duration.  She noticed some change in appearance of her right hand after losing significant weight at least 2 years ago.  She does not know when her small finger numbness began but has been going on for quite some time.  She  has no complaints with the left hand.  She has no nocturnal symptoms in the side.  She has no numbness or tingling present.   Review of Systems   Objective: Vital Signs: Ht 5\' 10"  (1.778 m)    Wt 283 lb (128.4 kg)    BMI 40.61 kg/m   Physical Exam Constitutional:      Appearance: Normal appearance.  Cardiovascular:     Rate and Rhythm: Normal rate.     Pulses: Normal pulses.  Pulmonary:     Effort: Pulmonary effort is normal.  Skin:    General: Skin is warm and dry.     Capillary Refill: Capillary refill takes less than 2 seconds.  Neurological:     Mental Status: She is alert.    Right Hand Exam   Tenderness  The patient is experiencing no tenderness.   Range of Motion  The patient has normal right wrist ROM.   Muscle Strength  The patient has normal right wrist strength.  Tests  Phalens sign: positive Tinel's sign (median nerve): positive  Other  Erythema: absent Sensation: normal Pulse: present  Comments:  Intact 2PD.  5/5 thenar motor strength without atrophy.    Left Hand Exam   Tenderness  The patient is experiencing no tenderness.   Other  Erythema: absent Sensation: decreased Pulse: present  Comments:  Significant wasting of FDI and intrinsics.  Clawing of ring finger.  + Wartenberg sign.  4-/5 FDP to ring and small finger.  5/5 thenar motor strength without atrophy.  2PD 53mm in ulnar ring finger and not discernible in small finger.  Negative Tinel at elbow or wrist.  Negative Phalen sign.      Specialty Comments:  No specialty comments available.  Imaging: No results found.   PMFS History: Patient Active Problem List   Diagnosis Date Noted   PTSD (post-traumatic stress disorder) 08/23/2021   Mild cannabis use disorder 08/23/2021   Hidradenitis suppurativa 12/11/2019   Morbid obesity ([facility]) 10/27/2017   Chlamydia 10/18/2017   Pap smear abnormality of cervix/human papillomavirus (HPV) positive 10/18/2017   Elevated blood pressure  reading 10/08/2017   Irregular menses 10/08/2017   Past Medical History:  Diagnosis Date   Anemia    Complication of anesthesia    Hx MRSA infection    Morbid obesity ([facility]) 10/27/2017    Family History  Problem Relation Age of Onset   Diabetes Maternal Grandmother    Hypertension Maternal Grandmother    Breast cancer Maternal Uncle    Diabetes Maternal Uncle     Past Surgical History:  Procedure Laterality Date   TONSILLECTOMY     Social History   Occupational History   Not on file  Tobacco Use   Smoking status: Never   Smokeless tobacco: Never  Vaping Use   Vaping Use: Never used  Substance and Sexual Activity   Alcohol use: No   Drug use: Yes    Types: Marijuana   Sexual activity: Yes    Birth control/protection: None

## 2021-11-18 ENCOUNTER — Ambulatory Visit (INDEPENDENT_AMBULATORY_CARE_PROVIDER_SITE_OTHER): Payer: 59 | Admitting: Clinical

## 2021-11-18 ENCOUNTER — Other Ambulatory Visit: Payer: Self-pay

## 2021-11-18 DIAGNOSIS — F1210 Cannabis abuse, uncomplicated: Secondary | ICD-10-CM

## 2021-11-18 DIAGNOSIS — F4310 Post-traumatic stress disorder, unspecified: Secondary | ICD-10-CM | POA: Diagnosis not present

## 2021-11-19 ENCOUNTER — Ambulatory Visit: Payer: Self-pay

## 2021-11-19 ENCOUNTER — Telehealth: Payer: Self-pay | Admitting: Family Medicine

## 2021-11-19 NOTE — Telephone Encounter (Signed)
Summary: needs another Rx for diflucan  ? Medication Refill - Medication: fluconazole (DIFLUCAN) 150 MG tablet  ? Pt states she finished her abx the dr prescribed.  Was given this medication, and told if it didn't get better to call back for another round of diflucan.  Pt states she still feels like she has yeast infection   ?  ?         Left message to call back about symptoms. ?
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
## 2021-11-19 NOTE — Telephone Encounter (Signed)
Summary: needs another Rx for diflucan  ? Medication Refill - Medication: fluconazole (DIFLUCAN) 150 MG tablet  ? Pt states she finished her abx the dr prescribed.  Was given this medication, and told if it didn't get better to call back for another round of diflucan.  Pt states she still feels like she has yeast infection   ?  ?Called pt  - LMOM to call back ?

## 2021-11-19 NOTE — Telephone Encounter (Signed)
See triage encounter, patient will be triaged to address why she needs difllucan refill. ?

## 2021-11-19 NOTE — Telephone Encounter (Signed)
Patient called, left VM to return the call to the office to discuss symptoms with a nurse. Will route to office per protocol for 3 unsuccessful attempts.  ? ?Summary: needs another Rx for diflucan  ? Medication Refill - Medication: fluconazole (DIFLUCAN) 150 MG tablet  ? Pt states she finished her abx the dr prescribed.  Was given this medication, and told if it didn't get better to call back for another round of diflucan.  Pt states she still feels like she has yeast infection   ?  ? ?

## 2021-11-19 NOTE — Telephone Encounter (Signed)
Medication Refill - Medication: fluconazole (DIFLUCAN) 150 MG tablet ? ?Has the patient contacted their pharmacy? No. ?Pt states she finished her abx the dr prescribed.  Was given this medication, and told if it didn't get better to call back for another round of diflucan ?Preferred Pharmacy (with phone number or street name): [facility], [address] ?Has the patient been seen for an appointment in the last year OR does the patient have an upcoming appointment? Yes.   ? ?Agent: Please be advised that RX refills may take up to 3 business days. We ask that you follow-up with your pharmacy. ? ? ?Pt was prescribed abx from Dr Wynetta Emery and diflucan for yeast infection.  Pt states ?

## 2021-11-24 ENCOUNTER — Ambulatory Visit (INDEPENDENT_AMBULATORY_CARE_PROVIDER_SITE_OTHER): Payer: 59 | Admitting: Physical Medicine and Rehabilitation

## 2021-11-24 ENCOUNTER — Other Ambulatory Visit: Payer: Self-pay

## 2021-11-24 ENCOUNTER — Encounter: Payer: Self-pay | Admitting: Physical Medicine and Rehabilitation

## 2021-11-24 DIAGNOSIS — R202 Paresthesia of skin: Secondary | ICD-10-CM | POA: Diagnosis not present

## 2021-11-24 NOTE — Progress Notes (Signed)
Pt state right hand pain and numbness. Pt state she works a lot with her hands, at time her right hand goes numbness and she has no feeling in her pinky and ringing finger. Pt state she takes over the counter pain meds to help ease her pain. Pt state she right handed. Pt state she can't rate her pain because she cant feel her pain. ? ?Numeric Pain Rating Scale and Functional Assessment ?Average Pain 0 ? ? ?In the last MONTH (on 0-10 scale) has pain interfered with the following? ? ?1. General activity like being  able to carry out your everyday physical activities such as walking, climbing stairs, carrying groceries, or moving a chair?  ?Rating(2) ? ? ? ? ?

## 2021-11-29 NOTE — Procedures (Signed)
EMG & NCV Findings: ?Evaluation of the right ulnar motor nerve showed reduced amplitude (0.2 mV), decreased conduction velocity (B Elbow-Wrist, 18 m/s), and decreased conduction velocity (A Elbow-B Elbow, 8 m/s).  The right median (across palm) sensory nerve showed prolonged distal peak latency (Wrist, 5.2 ms) and prolonged distal peak latency (Palm, 3.5 ms).  The right ulnar sensory nerve showed no response (Wrist).  All remaining nerves (as indicated in the following tables) were within normal limits.   ? ?Needle evaluation of the right first dorsal interosseous muscle showed decreased insertional activity, widespread spontaneous activity, and diminished recruitment.  The right flexor digitorum profundus muscle showed diminished recruitment.  The right flexor carpi ulnaris muscle showed widespread spontaneous activity and diminished recruitment.  All remaining muscles (as indicated in the following table) showed no evidence of electrical instability.   ? 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 The lesion is characterized by sensory and motor demyelination with evidence of significant axonal injury.  Due to the amount of atrophy it is hard to localize the lesion on nerve conduction study.  Needle EMG seems to locate this proximal to the wrist.  There is likely going to be residual weakness and symptoms despite good decompression therapy ? ?There is evidence of a mild median neuropathy at the wrist. ? ?There is no significant electrodiagnostic evidence of any other focal nerve entrapment, brachial plexopathy or cervical radiculopathy.  ? ?Recommendations: ?1.  Follow-up with referring physician. ?2.  Continue current management of symptoms. ?3.  Suggest surgical evaluation. ? ?___________________________ ?Ashlee Fox FAAPMR ?Board Certified, Biomedical engineer of Physical Medicine  and Rehabilitation ? ? ? ?Nerve Conduction Studies ?Anti Sensory Summary Table ? ? Stim Site NR Peak (ms) Norm Peak (ms) P-T Amp (?V) Norm P-T Amp Site1 Site2 Delta-P (ms) Dist (cm) Vel (m/s) Norm Vel (m/s)  ?Right Median Acr Palm Anti Sensory (2nd Digit)  31.6?C  ?Wrist    *5.2 <3.6 11.8 >10 Wrist Palm 1.7 0.0    ?Palm    *3.5 <2.0 6.0         ?Right Radial Anti Sensory (Base 1st Digit)  31.5?C  ?Wrist    2.0 <3.1 16.6  Wrist Base 1st Digit 2.0 0.0    ?Right Ulnar Anti Sensory (5th Digit)  31.9?C  ?Wrist *NR  <3.7  >15.0 Wrist 5th Digit  14.0  >38  ? ?Motor Summary Table ? ? Stim Site NR Onset (ms) Norm Onset (ms) O-P Amp (mV) Norm O-P Amp Site1 Site2 Delta-0 (ms) Dist (cm) Vel (m/s) Norm Vel (m/s)  ?Right Median Motor (Abd Poll Brev)  31.5?C  ?Wrist    4.1 <4.2 10.6 >5 Elbow Wrist 4.5 24.0 53 >50  ?Elbow    8.6  10.4         ?Right Ulnar Motor (Abd Dig Min)  31.5?C  ?Wrist    0.9 <4.2 *0.2 >3 B Elbow Wrist 12.9 23.0 *18 >53  ?B Elbow    13.8  0.0  A Elbow B Elbow 12.8 10.0 *8 >53  ?A Elbow    26.6  0.0         ? ?EMG ? ? Side Muscle Nerve Root Ins Act Fibs Psw Amp Dur Poly Recrt Int Dennie Bible Comment  ?Right Abd Poll Brev Median C8-T1 Nml Nml Nml Nml Nml 0 Nml Nml   ?Right 1stDorInt Ulnar C8-T1 *Decr *4+ *4+ Nml Nml  0 *Reduced Nml rare MUAP  ?Right PronatorTeres Median C6-7 Nml Nml Nml Nml Nml 0 Nml Nml   ?Right Biceps Musculocut C5-6 Nml Nml Nml Nml Nml 0 Nml Nml   ?Right Deltoid Axillary C5-6 Nml Nml Nml Nml Nml 0 Nml Nml   ?Right FlexDigProf Ulnar C8,T1 Nml Nml Nml Nml Nml 0 *Reduced Nml hard to tell MUAP as mixed innervation  ?Right FlexCarpiUln Ulnar C8,T1 Nml *4+ *4+ Nml Nml 0 *Reduced Nml rare MUAP  ? ? ?Nerve Conduction Studies ?Anti Sensory Left/Right Comparison ? ? Stim Site L Lat (ms) R Lat (ms) L-R Lat (ms) L Amp (?V) R Amp (?V) L-R Amp (%) Site1 Site2 L Vel (m/s) R Vel (m/s) L-R Vel (m/s)  ?Median Acr Palm Anti Sensory (2nd Digit)  31.6?C  ?Wrist  *5.2   11.8  Wrist Palm     ?Palm  *3.5   6.0        ?Radial  Anti Sensory (Base 1st Digit)  31.5?C  ?Wrist  2.0   16.6  Wrist Base 1st Digit     ?Ulnar Anti Sensory (5th Digit)  31.9?C  ?Wrist       Wrist 5th Digit     ? ?Motor Left/Right Comparison ? ? Stim Site L Lat (ms) R Lat (ms) L-R Lat (ms) L Amp (mV) R Amp (mV) L-R Amp (%) Site1 Site2 L Vel (m/s) R Vel (m/s) L-R Vel (m/s)  ?Median Motor (Abd Poll Brev)  31.5?C  ?Wrist  4.1   10.6  Elbow Wrist  53   ?Elbow  8.6   10.4        ?Ulnar Motor (Abd Dig Min)  31.5?C  ?Wrist  0.9   *0.2  B Elbow Wrist  *18   ?B Elbow  13.8   0.0  A Elbow B Elbow  *8   ?A Elbow  26.6   0.0        ? ? ? ?Waveforms: ?    ? ?   ? ? ?

## 2021-11-29 NOTE — Progress Notes (Signed)
Ashlee Fox - 36 y.o. female MRN [id number]  Date of birth: [birth year]  Office Visit Note: Visit Date: 11/24/2021 PCP: Charlott Rakes, MD Referred by: Sherilyn Cooter, MD  Subjective: Chief Complaint  Patient presents with   Right Hand - Pain   HPI:  Ashlee Fox is a 35 y.o. female who comes in today at the request of Dr. Sherilyn Cooter for electrodiagnostic study of the Right upper extremities.  Patient is Right hand dominant.  Clinical history significant for severe right ulnar nerve neuropathy.  She reports that she initially saw some changes in her right hand after she lost a significant amount of weight a few years ago.  She has previously worked as a Special educational needs teacher and now has been to [employer] and does do a lot of cooking and use of her hands at work.  She finds this very difficult at this point.  She has had multiple injuries of the fifth digit do to numbness.  She reports significant numbness and weakness in the hand particularly of the fourth and fifth digits.  No frank radicular symptoms.  No history of diabetes.  No symptoms on the left.  She has not had prior electrodiagnostic studies.  ROS Otherwise per HPI.  Assessment & Plan: Visit Diagnoses:    ICD-10-CM   1. 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 The lesion is characterized by sensory and motor demyelination with evidence of significant axonal injury.  Due to the amount of atrophy it is hard to localize the lesion on nerve conduction study.  Needle EMG seems to locate this proximal to the wrist.  There is likely going to be residual weakness and symptoms despite good decompression therapy  There is evidence of a mild median neuropathy at the wrist.  There is no significant electrodiagnostic  evidence of any other focal nerve entrapment, brachial plexopathy or cervical radiculopathy.   Recommendations: 1.  Follow-up with referring physician. 2.  Continue current management of symptoms. 3.  Suggest surgical evaluation.  Meds & Orders: No orders of the defined types were placed in this encounter.   Orders Placed This Encounter  Procedures   NCV with EMG (electromyography)    Follow-up: Return in about 2 weeks (around 12/08/2021) for Sherilyn Cooter, MD.   Procedures: No procedures performed  EMG & NCV Findings: Evaluation of the right ulnar motor nerve showed reduced amplitude (0.2 mV), decreased conduction velocity (B Elbow-Wrist, 18 m/s), and decreased conduction velocity (A Elbow-B Elbow, 8 m/s).  The right median (across palm) sensory nerve showed prolonged distal peak latency (Wrist, 5.2 ms) and prolonged distal peak latency (Palm, 3.5 ms).  The right ulnar sensory nerve showed no response (Wrist).  All remaining nerves (as indicated in the following tables) were within normal limits.    Needle evaluation of the right first dorsal interosseous muscle showed decreased insertional activity, widespread spontaneous activity, and diminished recruitment.  The right flexor digitorum profundus muscle showed diminished recruitment.  The right flexor carpi ulnaris muscle showed widespread spontaneous activity and diminished recruitment.  All remaining muscles (as indicated in the following table) showed no evidence of electrical instability.    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 The lesion is characterized by sensory and motor demyelination with evidence of significant axonal injury.  Due to the amount of atrophy it is hard to localize the lesion on nerve conduction study.  Needle EMG seems to locate this proximal to the wrist.  There is likely  going to be residual weakness and symptoms despite good decompression therapy  There is evidence of a mild median neuropathy at the wrist.  There is no significant electrodiagnostic evidence of any other focal nerve entrapment, brachial plexopathy or cervical radiculopathy.   Recommendations: 1.  Follow-up with referring physician. 2.  Continue current management of symptoms. 3.  Suggest surgical evaluation.  ___________________________ Laurence Spates FAAPMR Board Certified, American Board of Physical Medicine and Rehabilitation    Nerve Conduction Studies Anti Sensory Summary Table   Stim Site NR Peak (ms) Norm Peak (ms) P-T Amp (V) Norm P-T Amp Site1 Site2 Delta-P (ms) Dist (cm) Vel (m/s) Norm Vel (m/s)  Right Median Acr Palm Anti Sensory (2nd Digit)  31.6C  Wrist    *5.2 <3.6 11.8 >10 Wrist Palm 1.7 0.0    Palm    *3.5 <2.0 6.0         Right Radial Anti Sensory (Base 1st Digit)  31.5C  Wrist    2.0 <3.1 16.6  Wrist Base 1st Digit 2.0 0.0    Right Ulnar Anti Sensory (5th Digit)  31.9C  Wrist *NR  <3.7  >15.0 Wrist 5th Digit  14.0  >38   Motor Summary Table   Stim Site NR Onset (ms) Norm Onset (ms) O-P Amp (mV) Norm O-P Amp Site1 Site2 Delta-0 (ms) Dist (cm) Vel (m/s) Norm Vel (m/s)  Right Median Motor (Abd Poll Brev)  31.5C  Wrist    4.1 <4.2 10.6 >5 Elbow Wrist 4.5 24.0 53 >50  Elbow    8.6  10.4         Right Ulnar Motor (Abd Dig Min)  31.5C  Wrist    0.9 <4.2 *0.2 >3 B Elbow Wrist 12.9 23.0 *18 >53  B Elbow    13.8  0.0  A Elbow B Elbow 12.8 10.0 *8 >53  A Elbow    26.6  0.0          EMG   Side Muscle Nerve Root Ins Act Fibs Psw Amp Dur Poly Recrt Int Fraser Din Comment  Right Abd Poll Brev Median C8-T1 Nml Nml Nml Nml Nml 0 Nml Nml   Right 1stDorInt Ulnar C8-T1 *Decr *4+ *4+ Nml Nml 0 *Reduced Nml rare MUAP  Right PronatorTeres Median C6-7 Nml Nml Nml Nml Nml 0 Nml Nml   Right Biceps Musculocut C5-6 Nml Nml Nml Nml Nml 0 Nml Nml   Right Deltoid Axillary C5-6 Nml Nml  Nml Nml Nml 0 Nml Nml   Right FlexDigProf Ulnar C8,T1 Nml Nml Nml Nml Nml 0 *Reduced Nml hard to tell MUAP as mixed innervation  Right FlexCarpiUln Ulnar C8,T1 Nml *4+ *4+ Nml Nml 0 *Reduced Nml rare MUAP    Nerve Conduction Studies Anti Sensory Left/Right Comparison   Stim Site L Lat (ms) R Lat (ms) L-R Lat (ms) L Amp (V) R Amp (V) L-R Amp (%) Site1 Site2 L Vel (m/s) R Vel (m/s) L-R Vel (m/s)  Median Acr Palm Anti Sensory (2nd Digit)  31.6C  Wrist  *5.2   11.8  Wrist Palm     Palm  *3.5   6.0        Radial Anti Sensory (Base 1st Digit)  31.5C  Wrist  2.0   16.6  Wrist Base 1st Digit     Ulnar Anti Sensory (5th Digit)  31.9C  Wrist       Wrist 5th Digit      Motor Left/Right Comparison   Stim Site L Lat (ms) R Lat (ms) L-R Lat (ms) L Amp (mV) R Amp (mV) L-R Amp (%) Site1 Site2 L Vel (m/s) R Vel (m/s) L-R Vel (m/s)  Median Motor (Abd Poll Brev)  31.5C  Wrist  4.1   10.6  Elbow Wrist  53   Elbow  8.6   10.4        Ulnar Motor (Abd Dig Min)  31.5C  Wrist  0.9   *0.2  B Elbow Wrist  *18   B Elbow  13.8   0.0  A Elbow B Elbow  *8   A Elbow  26.6   0.0           Waveforms:             Clinical History: No specialty comments available.     Objective:  VS:  HT:     WT:    BMI:      BP:    HR: bpm   TEMP: ( )   RESP:  Physical Exam Musculoskeletal:        General: No swelling, tenderness or deformity.     Comments: Inspection reveals significant atrophy of the right FDI and intrinsic hand musculature.  There is no atrophy of the Bilateral APB musculature.  She has a positive Wartenberg and Benedictine sign. There is no swelling, color changes, allodynia or dystrophic changes.  She has good strength with right finger flexion but significant weakness with abduction.  There is significant impaired sensation in the right ulnar nerve distribution.. There is a positive Froment's test on the right. There is a negative Hoffmann's test bilaterally.  Skin:    General: Skin is warm  and dry.     Findings: No erythema or rash.  Neurological:     General: No focal deficit present.     Mental Status: She is alert and oriented to person, place, and time.     Motor: No weakness or abnormal muscle tone.     Coordination: Coordination normal.  Psychiatric:        Mood and Affect: Mood normal.        Behavior: Behavior normal.     Imaging: No results found.

## 2021-12-01 NOTE — Telephone Encounter (Signed)
Pt is requesting Diflucan but I cannot order since this is not on her current med list. Will forward to PCP for review.  ?

## 2021-12-01 NOTE — Telephone Encounter (Signed)
Pt called in and wanted to know why this has not been called in yet, pt requested a call back today if possible.  ?

## 2021-12-02 ENCOUNTER — Ambulatory Visit (INDEPENDENT_AMBULATORY_CARE_PROVIDER_SITE_OTHER): Payer: 59 | Admitting: Clinical

## 2021-12-02 ENCOUNTER — Other Ambulatory Visit: Payer: Self-pay

## 2021-12-02 DIAGNOSIS — F4310 Post-traumatic stress disorder, unspecified: Secondary | ICD-10-CM | POA: Diagnosis not present

## 2021-12-02 DIAGNOSIS — F1210 Cannabis abuse, uncomplicated: Secondary | ICD-10-CM

## 2021-12-02 MED ORDER — FLUCONAZOLE 150 MG PO TABS: 150.0000 mg | ORAL_TABLET | Freq: Once | ORAL | 0 refills | Status: AC

## 2021-12-02 NOTE — Addendum Note (Signed)
Addended byHoy Register on: 12/02/2021 02:29 PM ? ? Modules accepted: Orders ? ?

## 2021-12-02 NOTE — Telephone Encounter (Signed)
Prescription has been sent to pharmacy.

## 2021-12-02 NOTE — Telephone Encounter (Signed)
Pt was called and informed of medication being sent to pharmacy. 

## 2021-12-02 NOTE — Telephone Encounter (Signed)
Pt requesting Diflucan pill. ?

## 2021-12-03 ENCOUNTER — Ambulatory Visit (INDEPENDENT_AMBULATORY_CARE_PROVIDER_SITE_OTHER): Payer: 59 | Admitting: Orthopedic Surgery

## 2021-12-03 ENCOUNTER — Encounter: Payer: Self-pay | Admitting: Orthopedic Surgery

## 2021-12-03 DIAGNOSIS — G5621 Lesion of ulnar nerve, right upper limb: Secondary | ICD-10-CM | POA: Diagnosis not present

## 2021-12-03 DIAGNOSIS — G5601 Carpal tunnel syndrome, right upper limb: Secondary | ICD-10-CM | POA: Insufficient documentation
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
## 2021-12-03 NOTE — H&P (View-Only) (Signed)
? ?Office Visit Note ?  ?Patient: Ashlee Fox           ?Date of Birth: 05-15-1986           ?MRN: [id number] ?Visit Date: 12/03/2021 ?             ?Requested by: Charlott Rakes, MD ?[address] ?[address] ?[place],  [place] [address] ?PCP: Charlott Rakes, MD ? ? ?Assessment & Plan: ?Visit Diagnoses: No diagnosis found. ? 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  There is also electrodiagnostic evidence of a mild right carpal tunnel syndrome.  We again discussed the nature of ulnar compression neuropathy.  We discussed the role of ulnar nerve decompression and transposition and improving her lack of sensation and significant motor wasting.  We discussed the nature of the surgery as well as the expected postoperative course and recovery.  We discussed that given the prolonged nature of the compression, with hand atrophy present for at least two years, she may have incomplete return of her motor function and sensation.  We discussed the risks of surgery including but not limited to, bleeding, infection, damage to neurovascular structures, wound healing complications, incomplete symptom relief, neuropathic pain, need for additional surgery.  After our discussion, she would like to proceed with right cubital tunnel release with ulnar nerve transposition and right carpal tunnel release.  A surgical date and time will be confirmed with the patient. ? ?Follow-Up Instructions: No follow-ups on file.  ? ?Orders:  ?No orders of the defined types were placed in this encounter. ? ?No orders of the defined types were placed in this encounter. ? ? ? ? Procedures: ?No procedures performed ? ? ?Clinical Data: ?No additional findings. ? ? ?Subjective: ?Chief Complaint  ?Patient presents with  ? Right Hand - Pain  ? ? ?This is a 36 year old right-hand-dominant female who presents for follow up of right hand numbness and  muscle wasting.   ? ?From previous note dated 2/17; "She notes that she has no sensation in her right small finger.  She is injured small finger multiple times due to lack of sensation.  She also has hyperextension at the ring finger MP joint with resulting flexion at the PIP joint.  This been going on for unknown duration.  She noticed some change in appearance of her right hand after losing significant weight at least 2 years ago.  She does not know when her small finger numbness began but has been going on for quite some time.  At least since 2021. She has no complaints with the left hand.  She has no nocturnal symptoms in the side.  She has no numbness or tingling present." ? 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 Her symptoms are unchanged today.  ? ? ?Review of Systems ? ? ?Objective: ?Vital Signs: There were no vitals taken for this visit. ? ?Physical Exam ?Constitutional:   ?   Appearance: Normal appearance.  ?Cardiovascular:  ?   Rate and Rhythm: Normal rate.  ?   Pulses: Normal pulses.  ?Pulmonary:  ?   Effort: Pulmonary effort is normal.  ?Skin: ?   General: Skin is warm and dry.  ?   Capillary Refill: Capillary refill takes less than 2 seconds.  ?Neurological:  ?   Mental  Status: She is alert.  ? ? ?Right Hand Exam  ? ?Tenderness  ?The patient is experiencing no tenderness.  ? ?Range of Motion  ?The patient has normal right wrist ROM.  ? ?Other  ?Erythema: absent ?Sensation: decreased ?Pulse: present ? ?Comments:  Significant wasting of FDI and intrinsics. Clawing of ring finger.  Positive Wartenberg sign.  4-/5 FDP to ring and small finger.  5/5 thenar motor strength without atrophy.  0/5 FDI motor with significant wasting and no discernible muscle mass. 2PD 43mm in ulnar ring finger and >34mm in small finger.  Negative Tinel at elbow or wrist.  Negative Phalen sign.   ? ? ? ? ?Specialty Comments:  ?No specialty comments available. ? ?Imaging: ?No results found. ? ? ?PMFS History: ?Patient Active Problem List  ? Diagnosis Date Noted  ? PTSD (post-traumatic stress disorder) 08/23/2021  ? 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 Hidradenitis suppurativa 12/11/2019  ? Morbid obesity ([place]) 10/27/2017  ? Chlamydia 10/18/2017  ? Pap smear abnormality of cervix/human papillomavirus (HPV) positive 10/18/2017  ? Elevated blood pressure reading 10/08/2017  ? Irregular menses 10/08/2017  ? ?Past Medical History:  ?Diagnosis Date  ? Anemia   ? Complication of anesthesia   ? Hx MRSA infection   ? Morbid obesity ([place]) 10/27/2017  ?  ?Family History  ?Problem Relation Age of Onset  ? Diabetes Maternal Grandmother   ? Hypertension Maternal Grandmother   ? Breast cancer Maternal Uncle   ? Diabetes Maternal Uncle   ?  ?Past Surgical History:  ?Procedure Laterality Date  ? TONSILLECTOMY    ? ?Social History  ? ?Occupational History  ? Not on file  ?Tobacco Use  ? Smoking status: Never  ? Smokeless tobacco: Never  ?Vaping Use  ? Vaping Use: Never used  ?Substance and Sexual Activity  ? Alcohol use: No  ? Drug use: Yes  ?  Types: Marijuana  ? Sexual activity: Yes  ?  Birth control/protection: None  ? ? ? ? ? ? ?

## 2021-12-03 NOTE — Progress Notes (Signed)
? ?Office Visit Note ?  ?Patient: Ashlee Fox           ?Date of Birth: 1986/08/27           ?MRN: [id number] ?Visit Date: 12/03/2021 ?             ?Requested by: Charlott Rakes, MD ?[address] ?[address] ?[place],  [place] [address] ?PCP: Charlott Rakes, MD ? ? ?Assessment & Plan: ?Visit Diagnoses: No diagnosis found. ? 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  There is also electrodiagnostic evidence of a mild right carpal tunnel syndrome.  We again discussed the nature of ulnar compression neuropathy.  We discussed the role of ulnar nerve decompression and transposition and improving her lack of sensation and significant motor wasting.  We discussed the nature of the surgery as well as the expected postoperative course and recovery.  We discussed that given the prolonged nature of the compression, with hand atrophy present for at least two years, she may have incomplete return of her motor function and sensation.  We discussed the risks of surgery including but not limited to, bleeding, infection, damage to neurovascular structures, wound healing complications, incomplete symptom relief, neuropathic pain, need for additional surgery.  After our discussion, she would like to proceed with right cubital tunnel release with ulnar nerve transposition and right carpal tunnel release.  A surgical date and time will be confirmed with the patient. ? ?Follow-Up Instructions: No follow-ups on file.  ? ?Orders:  ?No orders of the defined types were placed in this encounter. ? ?No orders of the defined types were placed in this encounter. ? ? ? ? Procedures: ?No procedures performed ? ? ?Clinical Data: ?No additional findings. ? ? ?Subjective: ?Chief Complaint  ?Patient presents with  ? Right Hand - Pain  ? ? ?This is a 36 year old right-hand-dominant female who presents for follow up of right hand numbness and  muscle wasting.   ? ?From previous note dated 2/17; "She notes that she has no sensation in her right small finger.  She is injured small finger multiple times due to lack of sensation.  She also has hyperextension at the ring finger MP joint with resulting flexion at the PIP joint.  This been going on for unknown duration.  She noticed some change in appearance of her right hand after losing significant weight at least 2 years ago.  She does not know when her small finger numbness began but has been going on for quite some time.  At least since 2021. She has no complaints with the left hand.  She has no nocturnal symptoms in the side.  She has no numbness or tingling present." ? 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 Her symptoms are unchanged today.  ? ? ?Review of Systems ? ? ?Objective: ?Vital Signs: There were no vitals taken for this visit. ? ?Physical Exam ?Constitutional:   ?   Appearance: Normal appearance.  ?Cardiovascular:  ?   Rate and Rhythm: Normal rate.  ?   Pulses: Normal pulses.  ?Pulmonary:  ?   Effort: Pulmonary effort is normal.  ?Skin: ?   General: Skin is warm and dry.  ?   Capillary Refill: Capillary refill takes less than 2 seconds.  ?Neurological:  ?   Mental  Status: She is alert.  ? ? ?Right Hand Exam  ? ?Tenderness  ?The patient is experiencing no tenderness.  ? ?Range of Motion  ?The patient has normal right wrist ROM.  ? ?Other  ?Erythema: absent ?Sensation: decreased ?Pulse: present ? ?Comments:  Significant wasting of FDI and intrinsics. Clawing of ring finger.  Positive Wartenberg sign.  4-/5 FDP to ring and small finger.  5/5 thenar motor strength without atrophy.  0/5 FDI motor with significant wasting and no discernible muscle mass. 2PD 48mm in ulnar ring finger and >55mm in small finger.  Negative Tinel at elbow or wrist.  Negative Phalen sign.   ? ? ? ? ?Specialty Comments:  ?No specialty comments available. ? ?Imaging: ?No results found. ? ? ?PMFS History: ?Patient Active Problem List  ? Diagnosis Date Noted  ? PTSD (post-traumatic stress disorder) 08/23/2021  ? 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 Hidradenitis suppurativa 12/11/2019  ? Morbid obesity ([place]) 10/27/2017  ? Chlamydia 10/18/2017  ? Pap smear abnormality of cervix/human papillomavirus (HPV) positive 10/18/2017  ? Elevated blood pressure reading 10/08/2017  ? Irregular menses 10/08/2017  ? ?Past Medical History:  ?Diagnosis Date  ? Anemia   ? Complication of anesthesia   ? Hx MRSA infection   ? Morbid obesity ([place]) 10/27/2017  ?  ?Family History  ?Problem Relation Age of Onset  ? Diabetes Maternal Grandmother   ? Hypertension Maternal Grandmother   ? Breast cancer Maternal Uncle   ? Diabetes Maternal Uncle   ?  ?Past Surgical History:  ?Procedure Laterality Date  ? TONSILLECTOMY    ? ?Social History  ? ?Occupational History  ? Not on file  ?Tobacco Use  ? Smoking status: Never  ? Smokeless tobacco: Never  ?Vaping Use  ? Vaping Use: Never used  ?Substance and Sexual Activity  ? Alcohol use: No  ? Drug use: Yes  ?  Types: Marijuana  ? Sexual activity: Yes  ?  Birth control/protection: None  ? ? ? ? ? ? ?

## 2021-12-06 ENCOUNTER — Encounter (HOSPITAL_BASED_OUTPATIENT_CLINIC_OR_DEPARTMENT_OTHER): Payer: Self-pay | Admitting: Orthopedic Surgery

## 2021-12-06 ENCOUNTER — Other Ambulatory Visit: Payer: Self-pay

## 2021-12-13 ENCOUNTER — Encounter (HOSPITAL_BASED_OUTPATIENT_CLINIC_OR_DEPARTMENT_OTHER)
Admission: RE | Admit: 2021-12-13 | Discharge: 2021-12-13 | Disposition: A | Discharge status: Home / Self Care | Payer: 59 | Source: Ambulatory Visit | Attending: Orthopedic Surgery | Admitting: Orthopedic Surgery

## 2021-12-13 DIAGNOSIS — Z79899 Other long term (current) drug therapy: Secondary | ICD-10-CM | POA: Insufficient documentation

## 2021-12-13 DIAGNOSIS — Z01818 Encounter for other preprocedural examination: Secondary | ICD-10-CM | POA: Insufficient documentation

## 2021-12-13 DIAGNOSIS — I10 Essential (primary) hypertension: Secondary | ICD-10-CM | POA: Diagnosis not present

## 2021-12-13 DIAGNOSIS — G4730 Sleep apnea, unspecified: Secondary | ICD-10-CM | POA: Diagnosis not present

## 2021-12-13 DIAGNOSIS — G5601 Carpal tunnel syndrome, right upper limb: Secondary | ICD-10-CM | POA: Diagnosis not present

## 2021-12-13 DIAGNOSIS — F419 Anxiety disorder, unspecified: Secondary | ICD-10-CM | POA: Diagnosis not present

## 2021-12-13 DIAGNOSIS — G5621 Lesion of ulnar nerve, right upper limb: Secondary | ICD-10-CM | POA: Diagnosis not present

## 2021-12-13 DIAGNOSIS — F1210 Cannabis abuse, uncomplicated: Secondary | ICD-10-CM | POA: Diagnosis not present

## 2021-12-13 LAB — BASIC METABOLIC PANEL
Anion gap: 5 (ref 5–15)
BUN: 7 mg/dL (ref 6–20)
CO2: 27 mmol/L (ref 22–32)
Calcium: 9.2 mg/dL (ref 8.9–10.3)
Chloride: 106 mmol/L (ref 98–111)
Creatinine, Ser: 0.65 mg/dL (ref 0.44–1.00)
GFR, Estimated: >60 mL/min (ref >60)
Glucose, Bld: 95 mg/dL (ref 70–99)
Potassium: 3.6 mmol/L (ref 3.5–5.1)
Sodium: 138 mmol/L (ref 135–145)

## 2021-12-13 NOTE — Progress Notes (Signed)

## 2021-12-15 ENCOUNTER — Ambulatory Visit (HOSPITAL_BASED_OUTPATIENT_CLINIC_OR_DEPARTMENT_OTHER): Payer: 59 | Admitting: Anesthesiology

## 2021-12-15 ENCOUNTER — Other Ambulatory Visit: Payer: Self-pay

## 2021-12-15 ENCOUNTER — Encounter (HOSPITAL_BASED_OUTPATIENT_CLINIC_OR_DEPARTMENT_OTHER): Payer: Self-pay | Admitting: Orthopedic Surgery

## 2021-12-15 ENCOUNTER — Encounter (HOSPITAL_BASED_OUTPATIENT_CLINIC_OR_DEPARTMENT_OTHER)
Admission: RE | Disposition: A | Discharge status: Home / Self Care | Payer: Self-pay | Source: Home / Self Care | Attending: Orthopedic Surgery

## 2021-12-15 ENCOUNTER — Ambulatory Visit (HOSPITAL_BASED_OUTPATIENT_CLINIC_OR_DEPARTMENT_OTHER)
Admission: RE | Admit: 2021-12-15 | Discharge: 2021-12-15 | Disposition: A | Discharge status: Home / Self Care | Payer: 59 | Attending: Orthopedic Surgery | Admitting: Orthopedic Surgery

## 2021-12-15 DIAGNOSIS — F419 Anxiety disorder, unspecified: Secondary | ICD-10-CM | POA: Insufficient documentation

## 2021-12-15 DIAGNOSIS — I10 Essential (primary) hypertension: Secondary | ICD-10-CM | POA: Diagnosis not present

## 2021-12-15 DIAGNOSIS — G5621 Lesion of ulnar nerve, right upper limb: Secondary | ICD-10-CM | POA: Diagnosis not present

## 2021-12-15 DIAGNOSIS — G4730 Sleep apnea, unspecified: Secondary | ICD-10-CM | POA: Insufficient documentation

## 2021-12-15 DIAGNOSIS — G5601 Carpal tunnel syndrome, right upper limb: Secondary | ICD-10-CM

## 2021-12-15 DIAGNOSIS — F1210 Cannabis abuse, uncomplicated: Secondary | ICD-10-CM | POA: Insufficient documentation

## 2021-12-15 DIAGNOSIS — Z79899 Other long term (current) drug therapy: Secondary | ICD-10-CM

## 2021-12-15 HISTORY — PX: ULNAR NERVE TRANSPOSITION: SHX2595

## 2021-12-15 HISTORY — DX: Essential (primary) hypertension: I10

## 2021-12-15 HISTORY — PX: CARPAL TUNNEL RELEASE: SHX101

## 2021-12-15 LAB — POCT PREGNANCY, URINE: Preg Test, Ur: NEGATIVE

## 2021-12-15 SURGERY — ULNAR NERVE DECOMPRESSION/TRANSPOSITION
Anesthesia: Regional | Site: Wrist | Laterality: Right

## 2021-12-15 MED ORDER — AMISULPRIDE (ANTIEMETIC) 5 MG/2ML IV SOLN
10.0000 mg | Freq: Once | INTRAVENOUS | Status: AC | PRN
  Administered 2021-12-15: 10 mg via INTRAVENOUS

## 2021-12-15 MED ORDER — MIDAZOLAM HCL 2 MG/2ML IJ SOLN
INTRAMUSCULAR | Status: AC
Start: 2021-12-15 — End: ?
  Filled 2021-12-15: qty 2

## 2021-12-15 MED ORDER — CLINDAMYCIN PHOSPHATE 900 MG/50ML IV SOLN
INTRAVENOUS | Status: AC
  Filled 2021-12-15: qty 50

## 2021-12-15 MED ORDER — FENTANYL CITRATE (PF) 100 MCG/2ML IJ SOLN
INTRAMUSCULAR | Status: DC | PRN
Start: 2021-12-15 — End: 2021-12-15
  Administered 2021-12-15 (×4): 50 ug via INTRAVENOUS

## 2021-12-15 MED ORDER — ONDANSETRON HCL 4 MG/2ML IJ SOLN
INTRAMUSCULAR | Status: DC | PRN
  Administered 2021-12-15: 4 mg via INTRAVENOUS

## 2021-12-15 MED ORDER — 0.9 % SODIUM CHLORIDE (POUR BTL) OPTIME
TOPICAL | Status: DC | PRN
  Administered 2021-12-15: 60 mL
  Administered 2021-12-15: 250 mL

## 2021-12-15 MED ORDER — ONDANSETRON HCL 4 MG/2ML IJ SOLN: 4.0000 mg | Freq: Once | INTRAMUSCULAR | Status: DC | PRN

## 2021-12-15 MED ORDER — PHENYLEPHRINE 40 MCG/ML (10ML) SYRINGE FOR IV PUSH (FOR BLOOD PRESSURE SUPPORT)
PREFILLED_SYRINGE | INTRAVENOUS | Status: AC
  Filled 2021-12-15: qty 10

## 2021-12-15 MED ORDER — LIDOCAINE HCL (CARDIAC) PF 100 MG/5ML IV SOSY
PREFILLED_SYRINGE | INTRAVENOUS | Status: DC | PRN
  Administered 2021-12-15: 80 mg via INTRAVENOUS

## 2021-12-15 MED ORDER — PHENYLEPHRINE HCL (PRESSORS) 10 MG/ML IV SOLN
INTRAVENOUS | Status: DC | PRN
  Administered 2021-12-15 (×4): 80 ug via INTRAVENOUS

## 2021-12-15 MED ORDER — OXYCODONE HCL 5 MG/5ML PO SOLN: 5.0000 mg | Freq: Once | ORAL | Status: DC | PRN

## 2021-12-15 MED ORDER — FENTANYL CITRATE (PF) 100 MCG/2ML IJ SOLN
100.0000 ug | Freq: Once | INTRAMUSCULAR | Status: AC
  Administered 2021-12-15: 50 ug via INTRAVENOUS

## 2021-12-15 MED ORDER — GLYCOPYRROLATE PF 0.2 MG/ML IJ SOSY
PREFILLED_SYRINGE | INTRAMUSCULAR | Status: AC
  Filled 2021-12-15: qty 1

## 2021-12-15 MED ORDER — DEXAMETHASONE SODIUM PHOSPHATE 4 MG/ML IJ SOLN
INTRAMUSCULAR | Status: DC | PRN
  Administered 2021-12-15: 10 mg via INTRAVENOUS

## 2021-12-15 MED ORDER — LACTATED RINGERS IV SOLN: INTRAVENOUS | Status: DC

## 2021-12-15 MED ORDER — MIDAZOLAM HCL 5 MG/5ML IJ SOLN
INTRAMUSCULAR | Status: DC | PRN
Start: 2021-12-15 — End: 2021-12-15
  Administered 2021-12-15: 2 mg via INTRAVENOUS

## 2021-12-15 MED ORDER — FENTANYL CITRATE (PF) 100 MCG/2ML IJ SOLN
INTRAMUSCULAR | Status: AC
  Filled 2021-12-15: qty 2

## 2021-12-15 MED ORDER — ROPIVACAINE HCL 5 MG/ML IJ SOLN
INTRAMUSCULAR | Status: DC | PRN
  Administered 2021-12-15: 30 mL via PERINEURAL

## 2021-12-15 MED ORDER — ACETAMINOPHEN 500 MG PO TABS
ORAL_TABLET | ORAL | Status: AC
Start: 2021-12-15 — End: ?
  Filled 2021-12-15: qty 2

## 2021-12-15 MED ORDER — MIDAZOLAM HCL 2 MG/2ML IJ SOLN
2.0000 mg | Freq: Once | INTRAMUSCULAR | Status: AC
  Administered 2021-12-15: 2 mg via INTRAVENOUS

## 2021-12-15 MED ORDER — ACETAMINOPHEN 500 MG PO TABS
1000.0000 mg | ORAL_TABLET | Freq: Once | ORAL | Status: AC
  Administered 2021-12-15: 1000 mg via ORAL

## 2021-12-15 MED ORDER — AMISULPRIDE (ANTIEMETIC) 5 MG/2ML IV SOLN
INTRAVENOUS | Status: AC
  Filled 2021-12-15: qty 2

## 2021-12-15 MED ORDER — OXYCODONE HCL 5 MG PO TABS: 5.0000 mg | ORAL_TABLET | Freq: Once | ORAL | Status: DC | PRN

## 2021-12-15 MED ORDER — CLINDAMYCIN PHOSPHATE 900 MG/50ML IV SOLN
900.0000 mg | INTRAVENOUS | Status: AC
  Administered 2021-12-15: 900 mg via INTRAVENOUS

## 2021-12-15 MED ORDER — FENTANYL CITRATE (PF) 100 MCG/2ML IJ SOLN: 25.0000 ug | INTRAMUSCULAR | Status: DC | PRN

## 2021-12-15 MED ORDER — PROPOFOL 10 MG/ML IV BOLUS
INTRAVENOUS | Status: DC | PRN
  Administered 2021-12-15: 200 mg via INTRAVENOUS

## 2021-12-15 MED ORDER — MIDAZOLAM HCL 2 MG/2ML IJ SOLN
INTRAMUSCULAR | Status: AC
  Filled 2021-12-15: qty 2

## 2021-12-15 MED ORDER — OXYCODONE HCL 5 MG PO TABS: 5.0000 mg | ORAL_TABLET | ORAL | 0 refills | Status: AC | PRN

## 2021-12-15 SURGICAL SUPPLY — 62 items
ADH SKN CLS APL DERMABOND .7 (GAUZE/BANDAGES/DRESSINGS)
APL PRP STRL LF DISP 70% ISPRP (MISCELLANEOUS) ×4
BLADE SURG 15 STRL LF DISP TIS (BLADE) ×2 IMPLANT
BLADE SURG 15 STRL SS (BLADE) ×6
BNDG CMPR 9X4 STRL LF SNTH (GAUZE/BANDAGES/DRESSINGS) ×2
BNDG ELASTIC 3X5.8 VLCR STR LF (GAUZE/BANDAGES/DRESSINGS) ×3 IMPLANT
BNDG ELASTIC 4X5.8 VLCR STR LF (GAUZE/BANDAGES/DRESSINGS) ×1 IMPLANT
BNDG ELASTIC 6X5.8 VLCR STR LF (GAUZE/BANDAGES/DRESSINGS) ×1 IMPLANT
BNDG ESMARK 4X9 LF (GAUZE/BANDAGES/DRESSINGS) ×3 IMPLANT
BNDG GAUZE ELAST 4 BULKY (GAUZE/BANDAGES/DRESSINGS) ×3 IMPLANT
BNDG PLASTER X FAST 3X3 WHT LF (CAST SUPPLIES) IMPLANT
BNDG PLSTR 9X3 FST ST WHT (CAST SUPPLIES)
CHLORAPREP W/TINT 26 (MISCELLANEOUS) ×4 IMPLANT
CORD BIPOLAR FORCEPS 12FT (ELECTRODE) ×3 IMPLANT
COVER BACK TABLE 60X90IN (DRAPES) ×3 IMPLANT
COVER MAYO STAND STRL (DRAPES) ×3 IMPLANT
CUFF TOURN SGL QUICK 18X4 (TOURNIQUET CUFF) ×1 IMPLANT
CUFF TOURN SGL QUICK 24 (TOURNIQUET CUFF)
CUFF TRNQT CYL 24X4X16.5-23 (TOURNIQUET CUFF) IMPLANT
DERMABOND ADVANCED (GAUZE/BANDAGES/DRESSINGS)
DERMABOND ADVANCED .7 DNX12 (GAUZE/BANDAGES/DRESSINGS) IMPLANT
DRAPE C-ARM 35X43 STRL (DRAPES) ×2 IMPLANT
DRAPE EXTREMITY T 121X128X90 (DISPOSABLE) ×3 IMPLANT
DRAPE IMP U-DRAPE 54X76 (DRAPES) ×1 IMPLANT
DRAPE SURG 17X23 STRL (DRAPES) ×3 IMPLANT
GAUZE SPONGE 4X4 12PLY STRL (GAUZE/BANDAGES/DRESSINGS) ×3 IMPLANT
GAUZE XEROFORM 1X8 LF (GAUZE/BANDAGES/DRESSINGS) ×1 IMPLANT
GLOVE SURG ENC MOIS LTX SZ7 (GLOVE) ×2 IMPLANT
GLOVE SURG POLYISO LF SZ6.5 (GLOVE) ×1 IMPLANT
GLOVE SURG POLYISO LF SZ7 (GLOVE) ×2 IMPLANT
GLOVE SURG UNDER POLY LF SZ7 (GLOVE) ×7 IMPLANT
GOWN STRL REUS W/ TWL LRG LVL3 (GOWN DISPOSABLE) ×2 IMPLANT
GOWN STRL REUS W/ TWL XL LVL3 (GOWN DISPOSABLE) ×2 IMPLANT
GOWN STRL REUS W/TWL LRG LVL3 (GOWN DISPOSABLE) ×3
GOWN STRL REUS W/TWL XL LVL3 (GOWN DISPOSABLE) ×6 IMPLANT
NDL HYPO 25X1 1.5 SAFETY (NEEDLE) IMPLANT
NEEDLE HYPO 25X1 1.5 SAFETY (NEEDLE) ×3 IMPLANT
NS IRRIG 1000ML POUR BTL (IV SOLUTION) ×3 IMPLANT
PACK BASIN DAY SURGERY FS (CUSTOM PROCEDURE TRAY) ×3 IMPLANT
PAD CAST 3X4 CTTN HI CHSV (CAST SUPPLIES) ×2 IMPLANT
PAD CAST 4YDX4 CTTN HI CHSV (CAST SUPPLIES) IMPLANT
PADDING CAST COTTON 3X4 STRL (CAST SUPPLIES) ×3
PADDING CAST COTTON 4X4 STRL (CAST SUPPLIES) ×6
PADDING CAST COTTON 6X4 STRL (CAST SUPPLIES) ×2 IMPLANT
SLEEVE SCD COMPRESS KNEE MED (STOCKING) ×1 IMPLANT
SLING ARM FOAM STRAP XLG (SOFTGOODS) ×1 IMPLANT
SPLINT FIBERGLASS 4X30 (CAST SUPPLIES) ×1 IMPLANT
STRIP CLOSURE SKIN 1/2X4 (GAUZE/BANDAGES/DRESSINGS) IMPLANT
SUCTION FRAZIER HANDLE 10FR (MISCELLANEOUS)
SUCTION TUBE FRAZIER 10FR DISP (MISCELLANEOUS) IMPLANT
SUT ETHIBOND 3-0 V-5 (SUTURE) ×1 IMPLANT
SUT ETHILON 3 0 PS 1 (SUTURE) ×2 IMPLANT
SUT ETHILON 4 0 PS 2 18 (SUTURE) ×4 IMPLANT
SUT MNCRL AB 3-0 PS2 18 (SUTURE) ×1 IMPLANT
SUT MNCRL AB 4-0 PS2 18 (SUTURE) IMPLANT
SUT VICRYL 0 SH 27 (SUTURE) IMPLANT
SUT VICRYL 4-0 PS2 18IN ABS (SUTURE) IMPLANT
SYR BULB EAR ULCER 3OZ GRN STR (SYRINGE) ×3 IMPLANT
SYR CONTROL 10ML LL (SYRINGE) ×1 IMPLANT
TOWEL GREEN STERILE FF (TOWEL DISPOSABLE) ×6 IMPLANT
TUBE CONNECTING 20X1/4 (TUBING) IMPLANT
UNDERPAD 30X36 HEAVY ABSORB (UNDERPADS AND DIAPERS) ×3 IMPLANT

## 2021-12-15 NOTE — Op Note (Addendum)
? ?Date of Surgery: 12/15/2021 ? ?INDICATIONS: Ms. 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  Her ulnar neuropathy has been present for years.  She cannot remember the last time her hand looked normal without wasting.  Risks, benefits, and alternatives to surgery were again discussed with the patient wishing to proceed with surgery.  Informed consent was signed after our discussion.  ? ?PREOPERATIVE DIAGNOSIS:  ?Right carpal tunnel syndrome ?Right cubital tunnel syndrome ? ?POSTOPERATIVE DIAGNOSIS: Same. ? ?PROCEDURE:  ?Right carpal tunnel release ?Right cubital tunnel release ? ? ?SURGEON: Audria Nine, M.D. ? ?ASSIST:  ? ?ANESTHESIA:  general, regional ? ?IV FLUIDS AND URINE: See anesthesia. ? ?ESTIMATED BLOOD LOSS: 10 mL. ? ?IMPLANTS: * No implants in log *  ? ?DRAINS: None ? ?COMPLICATIONS: None ? ?DESCRIPTION OF PROCEDURE: The patient was met in the preoperative holding area where the surgical site was marked and the consent form was verified.  The patient was then taken to the operating room and transferred to the operating table.  All bony prominences were well padded. The operative extremity was prepped and draped in the usual and sterile fashion.  A sterile tourniquet was applied to the upper arm.  A formal time-out was performed to confirm that this was the correct patient, surgery, side, and site.  ? ?Following a second timeout, the limb was exsanguinated and the tourniquet inflated to 250 mmHg.  A longitudinal incision was made in line with the radial border of the ring finger from distal to the wrist flexion crease to the intersection of Kaplan's cardinal line.  The skin and subcutaneous tissue was sharply divided.  The longitudinally running palmar fascia was incised.  The thenar musculature was bluntly swept off of the transverse carpal ligament.  The ligament was divided from proximal to distal until the fat surrounding  the palmar arch was encountered. Retractors were then placed in the proximal aspect of the wound to visualize the distal antebrachial fascia.  The fascia was sharply divided under direct visualization.   The wound was then thoroughly irrigated with sterile saline.  The wound was then closed with 4-0 nylon sutures in a horizontal mattress fashion.  ? ?I then turned my attention to the cubital tunnel release.  A longitudinal incision was made just posterior to the medial condyle.  The skin subcutaneous tissue was divided.  Small crossing vessels were coagulated bipolar cautery.  Blunt dissection was used to identify the ulnar nerve just posterior and proximal to the epicondyle.  The dissection was quite difficult due to the amount of adipose tissue present.  The nerve was dissected proximally to the level of the arcade of [place].  The medial intermuscular septum was identified and was sharply excised with care taken to preserve the plexus of vessels just posterior to this.  The nerve was then dissected free distally.  The fascia overlying the FCU muscle bellies was incised.  Blunt dissection was used to divide the FCU muscle overlying the nerve.  Small branches to the FCU were protected.  There was significant adipose tissue overlying the flexor pronator mass.  This was dissected off using bipolar cautery.  With range of motion of the elbow.  The nerve did sublux over the medial epicondyle.  The decision was then made to proceed with a transposition.  The nerve was freed up circumferentially at the level of the medial epicondyle and both proximally and distally.  The nerve was transposed  anteriorly over the medial condyle.  There is no kinking at the proximal or distal aspect of the nerve.  A broad, medially based fascial flap was elevated.  This flap was sewn to the overlying adipose tissue to create a subcutaneous sling.  The arm was taken through flexion extension the nerve did not appear to be kinked with no  areas of constriction.  The wound was then thoroughly irrigated with sterile saline.  The tourniquet was let down hemostasis was achieved with direct pressure and bipolar cautery.  The wounds then closed in layered fashion using 3-0 Monocryl suture in a buried interrupted fashion followed by a horizontal mattress suture with 4-0 nylon. ? ?The wounds were then dressed with Xeroform, folded Kerlix, cast padding, and a well-padded long-arm posterior splint was applied. ? ?The patient was then reversed from anesthesia and transferred to the postoperative bed.  All counts were correct x 2 at the end of the procedure.  The patient was taken to the recovery unit in stable condition.    ? ? ?POSTOPERATIVE PLAN: She will be discharged home with appropriate pain medication and discharge instructions.  I will see her back in 10 to 14 days for her first postop visit. ? ?Audria Nine, MD ?1:51 PM  ?

## 2021-12-15 NOTE — Brief Op Note (Signed)
12/15/2021 ? ?1:43 PM ? ?PATIENT:  Ashlee Fox  36 y.o. female ? ?PRE-OPERATIVE DIAGNOSIS:  RIGHT CUBITAL TUNNEL SYNDROME, RIGHT CARPAL TUNNEL SYNDROME ? ?POST-OPERATIVE DIAGNOSIS:  RIGHT CUBITAL TUNNEL SYNDROME, RIGHT CARPAL  ? ?PROCEDURE:  Procedure(s): ?RIGHT ULNAR NERVE DECOMPRESSION/TRANSPOSITION (Right) ?RIGHT CARPAL TUNNEL RELEASE (Right) ? ?SURGEON:  Surgeon(s) and Role: ?   * Sherilyn Cooter, MD - Primary ? ?PHYSICIAN ASSISTANT:  ? ?ASSISTANTS: none  ? ?ANESTHESIA:   regional and MAC ? ?EBL:  10 mL  ? ?BLOOD ADMINISTERED:none ? ?DRAINS: none  ? ?LOCAL MEDICATIONS USED:  NONE ? ?SPECIMEN:  No Specimen ? ?DISPOSITION OF SPECIMEN:  N/A ? ?COUNTS:  YES ? ?TOURNIQUET:   ?Total Tourniquet Time Documented: ?Upper Arm (Right) - 76 minutes ?Total: Upper Arm (Right) - 76 minutes ? ? ?DICTATION: .Dragon Dictation ? ?PLAN OF CARE: Discharge to home after PACU ? ?PATIENT DISPOSITION:  PACU - hemodynamically stable. ?  ?Delay start of Pharmacological VTE agent (>24hrs) due to surgical blood loss or risk of bleeding: not applicable ? ?

## 2021-12-15 NOTE — Interval H&P Note (Signed)
History and Physical Interval Note: ? ?12/15/2021 ?11:02 AM ? ?Ashlee Fox  has presented today for surgery, with the diagnosis of RIGHT CUBITAL TUNNEL SYNDROME, RIGHT CARPAL TUNNEL SYNDROME.  The various methods of treatment have been discussed with the patient and family. After consideration of risks, benefits and other options for treatment, the patient has consented to  Procedure(s): ?RIGHT ULNAR NERVE DECOMPRESSION/TRANSPOSITION (Right) ?RIGHT CARPAL TUNNEL RELEASE (Right) as a surgical intervention.  The patient's history has been reviewed, patient examined, no change in status, stable for surgery.  I have reviewed the patient's chart and labs.  Questions were answered to the patient's satisfaction.   ? ? ?Osama Coleson [name] ? ? ?

## 2021-12-15 NOTE — Anesthesia Procedure Notes (Signed)
Procedure Name: LMA Insertion ?Date/Time: 12/15/2021 11:40 AM ?Performed by: Cleda Clarks, CRNA ?Pre-anesthesia Checklist: Patient identified, Emergency Drugs available, Suction available and Patient being monitored ?Patient Re-evaluated:Patient Re-evaluated prior to induction ?Oxygen Delivery Method: Circle system utilized ?Preoxygenation: Pre-oxygenation with 100% oxygen ?Induction Type: IV induction ?Ventilation: Mask ventilation without difficulty ?LMA: LMA inserted ?LMA Size: 4.0 ?Number of attempts: 1 ?Placement Confirmation: positive ETCO2 ?Tube secured with: Tape ?Dental Injury: Teeth and Oropharynx as per pre-operative assessment  ? ? ? ? ?

## 2021-12-15 NOTE — Transfer of Care (Signed)
Immediate Anesthesia Transfer of Care Note ? ?Patient: Ashlee Fox ? ?Procedure(s) Performed: RIGHT ULNAR NERVE DECOMPRESSION/TRANSPOSITION (Right: Arm Lower) ?RIGHT CARPAL TUNNEL RELEASE (Right: Wrist) ? ?Patient Location: PACU ? ?Anesthesia Type:General ? ?Level of Consciousness: awake, alert  and oriented ? ?Airway & Oxygen Therapy: Patient Spontanous Breathing and Patient connected to nasal cannula oxygen ? ?Post-op Assessment: Report given to RN and Post -op Vital signs reviewed and stable ? ?Post vital signs: Reviewed and stable ? ?Last Vitals:  ?Vitals Value Taken Time  ?BP 119/88 12/15/21 1349  ?Temp    ?Pulse 93 12/15/21 1357  ?Resp 26 12/15/21 1357  ?SpO2 97 % 12/15/21 1357  ?Vitals shown include unvalidated device data. ? ?Last Pain:  ?Vitals:  ? 12/15/21 1352  ?TempSrc:   ?PainSc: Asleep  ?   ? ?Patients Stated Pain Goal: 4 (12/15/21 1024) ? ?Complications: No notable events documented. ?

## 2021-12-15 NOTE — Anesthesia Preprocedure Evaluation (Signed)
Anesthesia Evaluation  ?Patient identified by MRN, date of birth, ID band ?Patient awake ? ? ? ?Reviewed: ?Allergy & Precautions, NPO status , Patient's Chart, lab work & pertinent test results ? ?Airway ?Mallampati: III ? ?TM Distance: >3 FB ?Neck ROM: Full ? ? ?Comment: Redundant neck tissue Dental ?no notable dental hx. ? ?  ?Pulmonary ?neg pulmonary ROS, Sleep apnea: suggestive features, no formal diagnosis. , Patient abstained from smoking.,  ?  ?Pulmonary exam normal ?breath sounds clear to auscultation ? ? ? ? ? ? Cardiovascular ?hypertension, Pt. on medications ?negative cardio ROS ?Normal cardiovascular exam ?Rhythm:Regular Rate:Normal ? ? ?  ?Neuro/Psych ?PSYCHIATRIC DISORDERS Anxiety  Neuromuscular disease (carpal tunnel) negative neurological ROS ? negative psych ROS  ? GI/Hepatic ?negative GI ROS, Neg liver ROS, (+)  ?  ? substance abuse ? marijuana use,   ?Endo/Other  ?negative endocrine ROSMorbid obesityOn metformin ? Renal/GU ?negative Renal ROS  ?negative genitourinary ?  ?Musculoskeletal ?negative musculoskeletal ROS ?(+)  ? Abdominal ?  ?Peds ?negative pediatric ROS ?(+)  Hematology ?negative hematology ROS ?(+) Blood dyscrasia, anemia ,   ?Anesthesia Other Findings ? ? Reproductive/Obstetrics ?negative OB ROS ? ?  ? ? ? ? ? ? ? ? ? ? ? ? ? ?  ?  ? ? ? ? ? ? ? ? ?Anesthesia Physical ?Anesthesia Plan ? ?ASA: 3 ? ?Anesthesia Plan: General and Regional  ? ?Post-op Pain Management: Regional block* and Tylenol PO (pre-op)*  ? ?Induction: Intravenous ? ?PONV Risk Score and Plan: 3 and Midazolam, Treatment may vary due to age or medical condition, Ondansetron and Dexamethasone ? ?Airway Management Planned: LMA ? ?Additional Equipment: None ? ?Intra-op Plan:  ? ?Post-operative Plan: Extubation in OR ? ?Informed Consent: I have reviewed the patients History and Physical, chart, labs and discussed the procedure including the risks, benefits and alternatives for the proposed  anesthesia with the patient or authorized representative who has indicated his/her understanding and acceptance.  ? ? ? ?Dental advisory given ? ?Plan Discussed with: Anesthesiologist and CRNA ? ?Anesthesia Plan Comments: (Supraclavicular block for postop pain control. GA/LMA. Tanna Furry, MD  ?)  ? ? ? ? ? ? ?Anesthesia Quick Evaluation ? ?

## 2021-12-15 NOTE — Progress Notes (Signed)
Assisted Dr. Bass with right, supraclavicular, ultrasound guided block. Side rails up, monitors on throughout procedure. See vital signs in flow sheet. Tolerated Procedure well. 

## 2021-12-15 NOTE — Discharge Instructions (Addendum)
  Charles Benfield, M.D. Hand Surgery  POST-OPERATIVE DISCHARGE INSTRUCTIONS   PRESCRIPTIONS: You may have been given a prescription to be taken as directed for post-operative pain control.  You may also take over the counter ibuprofen/aleve and tylenol for pain. Take this as directed on the packaging. Do not exceed 3000 mg tylenol/acetaminophen in 24 hours.  Ibuprofen 600-800 mg (3-4) tablets by mouth every 6 hours as needed for pain.  OR Aleve 2 tablets by mouth every 12 hours (twice daily) as needed for pain.  AND/OR Tylenol 1000 mg (2 tablets) every 8 hours as needed for pain.  Please use your pain medication carefully, as refills are limited and you may not be provided with one.  As stated above, please use over the counter pain medicine - it will also be helpful with decreasing your swelling.    ANESTHESIA: After your surgery, post-surgical discomfort or pain is likely. This discomfort can last several days to a few weeks. At certain times of the day your discomfort may be more intense.   Did you receive a nerve block?  A nerve block can provide pain relief for one hour to two days after your surgery. As long as the nerve block is working, you will experience little or no sensation in the area the surgeon operated on.  As the nerve block wears off, you will begin to experience pain or discomfort. It is very important that you begin taking your prescribed pain medication before the nerve block fully wears off. Treating your pain at the first sign of the block wearing off will ensure your pain is better controlled and more tolerable when full-sensation returns. Do not wait until the pain is intolerable, as the medicine will be less effective. It is better to treat pain in advance than to try and catch up.   General Anesthesia:  If you did not receive a nerve block during your surgery, you will need to start taking your pain medication shortly after your surgery and should continue  to do so as prescribed by your surgeon.     ICE AND ELEVATION: You may use ice for the first 48-72 hours, but it is not critical.   Motion of your fingers is very important to decrease the swelling.  Elevation, as much as possible for the next 48 hours, is critical for decreasing swelling as well as for pain relief. Elevation means when you are seated or lying down, you hand should be at or above your heart. When walking, the hand needs to be at or above the level of your elbow.  If the bandage gets too tight, it may need to be loosened. Please contact our office and we will instruct you in how to do this.    SURGICAL BANDAGES:  Keep your dressing and/or splint clean and dry at all times.  Do not remove until you are seen again in the office.  If careful, you may place a plastic bag over your bandage and tape the end to shower, but be careful, do not get your bandages wet.     HAND THERAPY:  You may not need any. If you do, we will begin this at your follow up visit in the clinic.    ACTIVITY AND WORK: You are encouraged to move any fingers which are not in the bandage.  Light use of the fingers is allowed to assist the other hand with daily hygiene and eating, but strong gripping or lifting is often uncomfortable and   should be avoided.  ?You might miss a variable period of time from work and hopefully this issue has been discussed prior to surgery. You may not do any heavy work with your affected hand for about 2 weeks.  ? ? ?[facility] ?[address] ?[place],  [place]  [address] ?[phone]  ? ? ?Post Anesthesia Home Care Instructions ? ?Activity: ?Get plenty of rest for the remainder of the day. A responsible individual must stay with you for 24 hours following the procedure.  ?For the next 24 hours, DO NOT: ?-Drive a car ?-Advertising copywriter ?-Drink alcoholic beverages ?-Take any medication unless instructed by your physician ?-Make any legal decisions or sign  important papers. ? ?Meals: ?Start with liquid foods such as gelatin or soup. Progress to regular foods as tolerated. Avoid greasy, spicy, heavy foods. If nausea and/or vomiting occur, drink only clear liquids until the nausea and/or vomiting subsides. Call your physician if vomiting continues. ? ?Special Instructions/Symptoms: ?Your throat may feel dry or sore from the anesthesia or the breathing tube placed in your throat during surgery. If this causes discomfort, gargle with warm salt water. The discomfort should disappear within 24 hours. ? ?Tylenol can be given at 430pm today if needed. ? ?Regional Anesthesia Blocks ? ?1. Numbness or the inability to move the "blocked" extremity may last from 3-48 hours after placement. The length of time depends on the medication injected and your individual response to the medication. If the numbness is not going away after 48 hours, call your surgeon. ? ?2. The extremity that is blocked will need to be protected until the numbness is gone and the  Strength has returned. Because you cannot feel it, you will need to take extra care to avoid injury. Because it may be weak, you may have difficulty moving it or using it. You may not know what position it is in without looking at it while the block is in effect. ? ?3. For blocks in the legs and feet, returning to weight bearing and walking needs to be done carefully. You will need to wait until the numbness is entirely gone and the strength has returned. You should be able to move your leg and foot normally before you try and bear weight or walk. You will need someone to be with you when you first try to ensure you do not fall and possibly risk injury. ? ?4. Bruising and tenderness at the needle site are common side effects and will resolve in a few days. ? ?5. Persistent numbness or new problems with movement should be communicated to the surgeon or the [facility] [phone] [facility]  [phone]).  ?

## 2021-12-15 NOTE — Anesthesia Postprocedure Evaluation (Signed)
Anesthesia Post Note ? ?Patient: Ashlee Fox ? ?Procedure(s) Performed: RIGHT ULNAR NERVE DECOMPRESSION/TRANSPOSITION (Right: Arm Lower) ?RIGHT CARPAL TUNNEL RELEASE (Right: Wrist) ? ?  ? ?Patient location during evaluation: PACU ?Anesthesia Type: Regional ?Level of consciousness: awake ?Pain management: pain level controlled ?Vital Signs Assessment: post-procedure vital signs reviewed and stable ?Respiratory status: spontaneous breathing and respiratory function stable ?Cardiovascular status: stable ?Postop Assessment: no apparent nausea or vomiting ?Anesthetic complications: no ? ? ?No notable events documented. ? ?Last Vitals:  ?Vitals:  ? 12/15/21 1400 12/15/21 1415  ?BP: 125/77 122/87  ?Pulse: 98 91  ?Resp: 17 18  ?Temp:    ?SpO2: 98% 98%  ?  ?Last Pain:  ?Vitals:  ? 12/15/21 1415  ?TempSrc:   ?PainSc: 0-No pain  ? ? ?  ?  ?  ?  ?  ?  ? ?[name] ? ? ? ? ?

## 2021-12-16 ENCOUNTER — Encounter (HOSPITAL_BASED_OUTPATIENT_CLINIC_OR_DEPARTMENT_OTHER): Payer: Self-pay | Admitting: Orthopedic Surgery

## 2021-12-16 NOTE — Addendum Note (Signed)
Addendum  created 12/16/21 1256 by Kamiya Acord, Jewel Baize, CRNA  ? Charge Capture section accepted  ?  ?

## 2021-12-23 ENCOUNTER — Ambulatory Visit (INDEPENDENT_AMBULATORY_CARE_PROVIDER_SITE_OTHER): Payer: 59 | Admitting: Clinical

## 2021-12-23 DIAGNOSIS — F4310 Post-traumatic stress disorder, unspecified: Secondary | ICD-10-CM

## 2021-12-23 DIAGNOSIS — F1210 Cannabis abuse, uncomplicated: Secondary | ICD-10-CM | POA: Diagnosis not present

## 2021-12-27 ENCOUNTER — Encounter: Payer: 59 | Admitting: Orthopedic Surgery

## 2021-12-28 ENCOUNTER — Ambulatory Visit (INDEPENDENT_AMBULATORY_CARE_PROVIDER_SITE_OTHER): Payer: 59 | Admitting: Orthopedic Surgery

## 2021-12-28 DIAGNOSIS — G5601 Carpal tunnel syndrome, right upper limb: Secondary | ICD-10-CM

## 2021-12-28 DIAGNOSIS — G5621 Lesion of ulnar nerve, right upper limb: Secondary | ICD-10-CM

## 2021-12-28 NOTE — Progress Notes (Signed)
? ?  Post-Op Visit Note ?  ?Patient: Ashlee Fox           ?Date of Birth: Jan 23, 1986           ?MRN: [id number] ?Visit Date: 12/28/2021 ?PCP: Hoy Register, MD ? ? ?Assessment & Plan: ? ?Chief Complaint:  ?Chief Complaint  ?Patient presents with  ? Left Elbow - Routine Post Op  ?  Cubital Tunnel Release  ? Left Wrist - Routine Post Op  ?  Carpal tunnel release  ? ?Visit Diagnoses:  ?1. Carpal tunnel syndrome, right upper limb   ?2. Cubital tunnel syndrome on right   ? ? ?Plan: Patient is doing well s/p right CuTR w/ subcutaneous transposition and right CTR.  She has been in a long arm splint. She actually thinks the sensation in her small and ring fingers are slightly improved.  Her incisions are well approximated without erythema, induration, or drainage.  The sutures were removed.  She can see me again in another month.  ? ?Follow-Up Instructions: No follow-ups on file.  ? ?Orders:  ?No orders of the defined types were placed in this encounter. ? ?No orders of the defined types were placed in this encounter. ? ? ?Imaging: ?No results found. ? ?PMFS History: ?Patient Active Problem List  ? Diagnosis Date Noted  ? Cubital tunnel syndrome on right   ? Ulnar neuropathy at elbow of right upper extremity 12/03/2021  ? Carpal tunnel syndrome, right upper limb 12/03/2021  ? PTSD (post-traumatic stress disorder) 08/23/2021  ? 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 Hidradenitis suppurativa 12/11/2019  ? Morbid obesity (HCC) 10/27/2017  ? Chlamydia 10/18/2017  ? Pap smear abnormality of cervix/human papillomavirus (HPV) positive 10/18/2017  ? Elevated blood pressure reading 10/08/2017  ? Irregular menses 10/08/2017  ? ?Past Medical History:  ?Diagnosis Date  ? Anemia   ? Complication of anesthesia   ? Hx MRSA infection   ? Hypertension   ? Morbid obesity (HCC) 10/27/2017  ?  ?Family History  ?Problem Relation Age of Onset  ? Diabetes Maternal Grandmother   ? Hypertension Maternal Grandmother   ? Breast cancer Maternal  Uncle   ? Diabetes Maternal Uncle   ?  ?Past Surgical History:  ?Procedure Laterality Date  ? CARPAL TUNNEL RELEASE Right 12/15/2021  ? Procedure: RIGHT CARPAL TUNNEL RELEASE;  Surgeon: Marlyne Beards, MD;  Location: [facility];  Service: Orthopedics;  Laterality: Right;  ? TONSILLECTOMY    ? ULNAR NERVE TRANSPOSITION Right 12/15/2021  ? Procedure: RIGHT ULNAR NERVE DECOMPRESSION/TRANSPOSITION;  Surgeon: Marlyne Beards, MD;  Location: [facility];  Service: Orthopedics;  Laterality: Right;  ? ?Social History  ? ?Occupational History  ? Not on file  ?Tobacco Use  ? Smoking status: Never  ? Smokeless tobacco: Never  ?Vaping Use  ? Vaping Use: Never used  ?Substance and Sexual Activity  ? Alcohol use: No  ? Drug use: Yes  ?  Types: Marijuana  ?  Comment: 'avid user'  ? Sexual activity: Yes  ?  Birth control/protection: None  ? ? ? ?

## 2022-01-06 ENCOUNTER — Ambulatory Visit (INDEPENDENT_AMBULATORY_CARE_PROVIDER_SITE_OTHER): Payer: 59 | Admitting: Clinical

## 2022-01-06 DIAGNOSIS — F1210 Cannabis abuse, uncomplicated: Secondary | ICD-10-CM

## 2022-01-06 DIAGNOSIS — F4310 Post-traumatic stress disorder, unspecified: Secondary | ICD-10-CM
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
## 2022-01-27 ENCOUNTER — Ambulatory Visit (INDEPENDENT_AMBULATORY_CARE_PROVIDER_SITE_OTHER): Payer: 59 | Admitting: Orthopedic Surgery

## 2022-01-27 ENCOUNTER — Encounter: Payer: Self-pay | Admitting: Orthopedic Surgery

## 2022-01-27 DIAGNOSIS — G5621 Lesion of ulnar nerve, right upper limb: Secondary | ICD-10-CM

## 2022-01-27 DIAGNOSIS — G5601 Carpal tunnel syndrome, right upper limb: Secondary | ICD-10-CM

## 2022-01-27 NOTE — Progress Notes (Signed)
? ?  Post-Op Visit Note ?  ?Patient: Ashlee Fox           ?Date of Birth: 1986/09/15           ?MRN: [id number] ?Visit Date: 01/27/2022 ?PCP: Hoy Register, MD ? ? ?Assessment & Plan: ? ?Chief Complaint:  ?Chief Complaint  ?Patient presents with  ? Right Elbow - Routine Post Op  ? Right Hand - Routine Post Op  ? ?Visit Diagnoses:  ?1. Carpal tunnel syndrome, right upper limb   ?2. Ulnar neuropathy at elbow of right upper extremity   ? ? ?Plan: Patient is now 6-week status post right carpal tunnel release and right cubital tunnel release with subcutaneous transposition.  She is doing very well.  Incisions are well-healed.  She has some very mild sensitivity at the cubital tunnel incision but this seems to be improving.  She notes that the sensation in her ring and small finger have improved since the surgery.  We discussed remedies for scar desensitization for the cubital tunnel incision.  She can see me back again as needed. ? ?Follow-Up Instructions: No follow-ups on file.  ? ?Orders:  ?No orders of the defined types were placed in this encounter. ? ?No orders of the defined types were placed in this encounter. ? ? ?Imaging: ?No results found. ? ?PMFS History: ?Patient Active Problem List  ? Diagnosis Date Noted  ? Cubital tunnel syndrome on right   ? Ulnar neuropathy at elbow of right upper extremity 12/03/2021  ? Carpal tunnel syndrome, right upper limb 12/03/2021  ? PTSD (post-traumatic stress disorder) 08/23/2021  ? 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 Hidradenitis suppurativa 12/11/2019  ? Morbid obesity (HCC) 10/27/2017  ? Chlamydia 10/18/2017  ? Pap smear abnormality of cervix/human papillomavirus (HPV) positive 10/18/2017  ? Elevated blood pressure reading 10/08/2017  ? Irregular menses 10/08/2017  ? ?Past Medical History:  ?Diagnosis Date  ? Anemia   ? Complication of anesthesia   ? Hx MRSA infection   ? Hypertension   ? Morbid obesity (HCC) 10/27/2017  ?  ?Family History  ?Problem Relation Age of  Onset  ? Diabetes Maternal Grandmother   ? Hypertension Maternal Grandmother   ? Breast cancer Maternal Uncle   ? Diabetes Maternal Uncle   ?  ?Past Surgical History:  ?Procedure Laterality Date  ? CARPAL TUNNEL RELEASE Right 12/15/2021  ? Procedure: RIGHT CARPAL TUNNEL RELEASE;  Surgeon: Marlyne Beards, MD;  Location: [facility];  Service: Orthopedics;  Laterality: Right;  ? TONSILLECTOMY    ? ULNAR NERVE TRANSPOSITION Right 12/15/2021  ? Procedure: RIGHT ULNAR NERVE DECOMPRESSION/TRANSPOSITION;  Surgeon: Marlyne Beards, MD;  Location: [facility];  Service: Orthopedics;  Laterality: Right;  ? ?Social History  ? ?Occupational History  ? Not on file  ?Tobacco Use  ? Smoking status: Never  ? Smokeless tobacco: Never  ?Vaping Use  ? Vaping Use: Never used  ?Substance and Sexual Activity  ? Alcohol use: No  ? Drug use: Yes  ?  Types: Marijuana  ?  Comment: 'avid user'  ? Sexual activity: Yes  ?  Birth control/protection: None  ? ? ? ?

## 2022-04-15 ENCOUNTER — Other Ambulatory Visit: Payer: Self-pay | Admitting: Family Medicine

## 2022-04-15 DIAGNOSIS — I10 Essential (primary) hypertension: Secondary | ICD-10-CM

## 2022-04-15 NOTE — Telephone Encounter (Signed)
Requested Prescriptions  Pending Prescriptions Disp Refills  . hydrochlorothiazide (HYDRODIURIL) 25 MG tablet [Pharmacy Med Name: hydroCHLOROthiazide 25 MG Oral Tablet] 90 tablet 0    Sig: Take 1 tablet by mouth once daily     Cardiovascular: Diuretics - Thiazide Passed - 04/15/2022  8:48 AM      Passed - Cr in normal range and within 180 days    Creatinine, Ser  Date Value Ref Range Status  12/13/2021 0.65 0.44 - 1.00 mg/dL Final         Passed - K in normal range and within 180 days    Potassium  Date Value Ref Range Status  12/13/2021 3.6 3.5 - 5.1 mmol/L Final         Passed - Na in normal range and within 180 days    Sodium  Date Value Ref Range Status  12/13/2021 138 135 - 145 mmol/L Final  10/19/2021 141 134 - 144 mmol/L Final         Passed - Last BP in normal range    BP Readings from Last 1 Encounters:  12/15/21 133/80         Passed - Valid encounter within last 6 months    Recent Outpatient Visits          5 months ago Pain in both hands   [facility] [place], Odette Horns, MD   6 months ago Recurrent boils   [facility], Gildardo Pounds, NP   11 months ago PTSD (post-traumatic stress disorder)   [facility] Hoy Register, MD   1 year ago Essential hypertension   [facility] [place], [place], MD   2 years ago Annual physical exam   [facility] [place], [place], MD

## 2022-05-17 ENCOUNTER — Other Ambulatory Visit: Payer: Self-pay | Admitting: Family Medicine

## 2022-05-17 ENCOUNTER — Encounter: Payer: Self-pay | Admitting: *Deleted

## 2022-05-17 DIAGNOSIS — N970 Female infertility associated with anovulation: Secondary | ICD-10-CM

## 2022-05-17 NOTE — Telephone Encounter (Signed)
Voicemail not set up. Will try to reach her via MyChart to schedule an appt.

## 2022-07-21 ENCOUNTER — Other Ambulatory Visit: Payer: Self-pay | Admitting: Family Medicine

## 2022-07-21 DIAGNOSIS — I10 Essential (primary) hypertension: Secondary | ICD-10-CM

## 2022-07-22 NOTE — Telephone Encounter (Signed)
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 Patient scheduled first available with PCP- which is in January. Request RF until appointment  Requested Prescriptions  Pending Prescriptions Disp Refills   hydrochlorothiazide (HYDRODIURIL) 25 MG tablet [Pharmacy Med Name: hydroCHLOROthiazide 25 MG Oral Tablet] 90 tablet 0    Sig: Take 1 tablet by mouth once daily     Cardiovascular: Diuretics - Thiazide Failed - 07/21/2022  7:09 PM      Failed - Cr in normal range and within 180 days    Creatinine, Ser  Date Value Ref Range Status  12/13/2021 0.65 0.44 - 1.00 mg/dL Final         Failed - K in normal range and within 180 days    Potassium  Date Value Ref Range Status  12/13/2021 3.6 3.5 - 5.1 mmol/L Final         Failed - Na in normal range and within 180 days    Sodium  Date Value Ref Range Status  12/13/2021 138 135 - 145 mmol/L Final  10/19/2021 141 134 - 144 mmol/L Final         Failed - Valid encounter within last 6 months    Recent Outpatient Visits           9 months ago Pain in both hands   [facility] [place], [place], MD   9 months ago Recurrent boils   [facility], Gildardo Pounds, NP   1 year ago PTSD (post-traumatic stress disorder)   [facility] [place], Odette Horns, MD   1 year ago Essential hypertension   [facility] [place], [place], MD   2 years ago Annual physical exam   [facility] [place], [place], MD       Future Appointments             In 2 months Hoy Register, MD [facility]            Passed - Last BP in normal range    BP Readings from Last 1 Encounters:   12/15/21 133/80            Requested Prescriptions  Pending Prescriptions Disp Refills   hydrochlorothiazide (HYDRODIURIL) 25 MG tablet [Pharmacy Med Name: hydroCHLOROthiazide 25 MG Oral Tablet] 90 tablet 0    Sig: Take 1 tablet by mouth once daily     Cardiovascular: Diuretics - Thiazide Failed - 07/21/2022  7:09 PM      Failed - Cr in normal range and within 180 days    Creatinine, Ser  Date Value Ref Range Status  12/13/2021 0.65 0.44 - 1.00 mg/dL Final         Failed - K in normal range and within 180 days    Potassium  Date Value Ref Range Status  12/13/2021 3.6 3.5 - 5.1 mmol/L Final         Failed - Na in normal range and within 180 days    Sodium  Date Value Ref Range Status  12/13/2021 138 135 - 145 mmol/L Final  10/19/2021 141 134 - 144 mmol/L Final         Failed -  Valid encounter within last 6 months    Recent Outpatient Visits           9 months ago Pain in both hands   [place], [place], MD   9 months ago Recurrent boils   [facility] at [facility], Kriste Basque, NP   1 year ago PTSD (post-traumatic stress disorder)   [facility] Charlott Rakes, MD   1 year ago Essential hypertension   [place], [place], MD   2 years ago Annual physical exam   [place], MD       Future Appointments             In 2 months Charlott Rakes, MD [place] BP in normal range    BP Readings from Last 1 Encounters:  12/15/21 133/80

## 2022-08-19 DIAGNOSIS — Z419 Encounter for procedure for purposes other than remedying health state, unspecified: Secondary | ICD-10-CM | POA: Diagnosis not present

## 2022-09-19 DIAGNOSIS — Z419 Encounter for procedure for purposes other than remedying health state, unspecified: Secondary | ICD-10-CM | POA: Diagnosis not present

## 2022-10-17 ENCOUNTER — Other Ambulatory Visit: Payer: Self-pay | Admitting: Family Medicine

## 2022-10-17 ENCOUNTER — Encounter: Payer: Self-pay | Admitting: Family Medicine

## 2022-10-17 ENCOUNTER — Ambulatory Visit: Payer: 59 | Attending: Family Medicine | Admitting: Family Medicine

## 2022-10-17 VITALS — BP 144/87 | HR 77 | Ht 70.0 in | Wt 276.6 lb

## 2022-10-17 DIAGNOSIS — M25562 Pain in left knee: Secondary | ICD-10-CM

## 2022-10-17 DIAGNOSIS — R0981 Nasal congestion: Secondary | ICD-10-CM | POA: Diagnosis not present

## 2022-10-17 DIAGNOSIS — L600 Ingrowing nail: Secondary | ICD-10-CM

## 2022-10-17 DIAGNOSIS — R6889 Other general symptoms and signs: Secondary | ICD-10-CM | POA: Diagnosis not present

## 2022-10-17 DIAGNOSIS — I10 Essential (primary) hypertension: Secondary | ICD-10-CM

## 2022-10-17 DIAGNOSIS — G8929 Other chronic pain: Secondary | ICD-10-CM | POA: Diagnosis not present

## 2022-10-17 DIAGNOSIS — L732 Hidradenitis suppurativa: Secondary | ICD-10-CM

## 2022-10-17 MED ORDER — FLUTICASONE PROPIONATE 50 MCG/ACT NA SUSP: 2.0000 | Freq: Every day | NASAL | 1 refills | Status: DC

## 2022-10-17 MED ORDER — HYDROCHLOROTHIAZIDE 25 MG PO TABS: 25.0000 mg | ORAL_TABLET | Freq: Every day | ORAL | 1 refills | Status: DC

## 2022-10-17 MED ORDER — CETIRIZINE HCL 10 MG PO TABS: 10.0000 mg | ORAL_TABLET | Freq: Every day | ORAL | 1 refills | Status: DC

## 2022-10-17 MED ORDER — CHLORTHALIDONE 25 MG PO TABS: 25.0000 mg | ORAL_TABLET | Freq: Every day | ORAL | 1 refills | Status: DC

## 2022-10-17 MED ORDER — CHLORHEXIDINE GLUCONATE 4 % EX LIQD: Freq: Every day | CUTANEOUS | 0 refills | Status: DC | PRN

## 2022-10-17 MED ORDER — DOXYCYCLINE HYCLATE 100 MG PO TABS: 100.0000 mg | ORAL_TABLET | Freq: Two times a day (BID) | ORAL | 0 refills | Status: DC

## 2022-10-17 NOTE — Progress Notes (Signed)
Left knee pain Recurrent boils under arms Nasal congestion for 1 month Referral to Podiatry for ingrown toenails.

## 2022-10-17 NOTE — Patient Instructions (Signed)
Hidradenitis Suppurativa Hidradenitis suppurativa is a long-term (chronic) skin disease. It is similar to a severe form of acne, but it affects areas of the body where acne would be unusual, especially areas of the body where skin rubs against skin and becomes moist. These include: Underarms. Groin. Genital area. Buttocks. Upper thighs. Breasts. Hidradenitis suppurativa may start out as small lumps or pimples caused by blocked skin pores, sweat glands, or hair follicles. Pimples may develop into deep sores that break open (rupture) and drain pus. Over time, affected areas of skin may thicken and become scarred. This condition is rare and does not spread from person to person (non-contagious). What are the causes? The exact cause of this condition is not known. It may be related to: Female and female hormones. An overactive disease-fighting system (immune system). The immune system may over-react to blocked hair follicles or sweat glands and cause swelling and pus-filled sores. What increases the risk? You are more likely to develop this condition if you: Are female. Are 11-55 years old. Have a family history of hidradenitis suppurativa. Have a personal history of acne. Are overweight. Smoke. Take the medicine lithium. What are the signs or symptoms? The first symptoms are usually painful bumps in the skin, similar to pimples. The condition may get worse over time (progress), or it may only cause mild symptoms. If the disease progresses, symptoms may include: Skin bumps getting bigger and growing deeper into the skin. Bumps rupturing and draining pus. Itchy, infected skin. Skin getting thicker and scarred. Tunnels under the skin (fistulas) where pus drains from a bump. Pain during daily activities, such as pain during walking if your groin area is affected. Emotional problems, such as stress or depression. This condition may affect your appearance and your ability or willingness to wear  certain clothes or do certain activities. How is this diagnosed? This condition is diagnosed by a health care provider who specializes in skin conditions (dermatologist). You may be diagnosed based on: Your symptoms and medical history. A physical exam. Testing a pus sample for infection. Blood tests. How is this treated? Your treatment will depend on how severe your symptoms are. The same treatment will not work for everybody with this condition. You may need to try several treatments to find what works best for you. Treatment may include: Cleaning and bandaging (dressing) your wounds as needed. Lifestyle changes, such as new skin care routines. Taking medicines, such as: Antibiotics. Acne medicines. Medicines to reduce the activity of the immune system. A diabetes medicine (metformin). Birth control pills, for women. Steroids to reduce swelling and pain. Working with a mental health care provider, if you experience emotional distress due to this condition. If you have severe symptoms that do not get better with medicine, you may need surgery. Surgery may involve: Using a laser to clear the skin and remove hair follicles. Opening and draining deep sores. Removing the areas of skin that are diseased and scarred. Follow these instructions at home: Medicines  Take over-the-counter and prescription medicines only as told by your health care provider. If you were prescribed antibiotics, take them as told by your health care provider. Do not stop using the antibiotic even if your condition improves. Skin care If you have open wounds, cover them with a clean dressing as told by your health care provider. Keep wounds clean by washing them gently with soap and water when you bathe. Do not shave the areas where you get hidradenitis suppurativa. Wear loose-fitting clothes. Try to avoid   getting overheated or sweaty. If you get sweaty or wet, change into clean, dry clothes as soon as you can. To  help relieve pain and itchiness, cover sore areas with a warm, clean washcloth (warm compress) for 5-10 minutes as often as needed. Your healthcare provider may recommend an antiperspirant deodorant that may be gentle on your skin. A daily antiseptic wash to cleanse affected areas may be suggested by your healthcare provider. General instructions Learn as much as you can about your disease so that you have an active role in your treatment. Work closely with your health care provider to find treatments that work for you. If you are overweight, work with your health care provider to lose weight as recommended. Do not use any products that contain nicotine or tobacco. These products include cigarettes, chewing tobacco, and vaping devices, such as e-cigarettes. If you need help quitting, ask your health care provider. If you struggle with living with this condition, talk with your health care provider or work with a mental health care provider as recommended. Keep all follow-up visits. Where to find more information Hidradenitis Suppurativa Foundation, Inc.: www.hs-foundation.org American Academy of Dermatology: www.aad.org Contact a health care provider if: You have a flare-up of hidradenitis suppurativa. You have a fever or chills. You have trouble controlling your symptoms at home. You have trouble doing your daily activities because of your symptoms. You have trouble dealing with emotional problems related to your condition. Summary Hidradenitis suppurativa is a long-term (chronic) skin disease. It is similar to a severe form of acne, but it affects areas of the body where acne would be unusual. The first symptoms are usually painful bumps in the skin, similar to pimples. The condition may only cause mild symptoms, or it may get worse over time (progress). If you have open wounds, cover them with a clean dressing as told by your health care provider. Keep wounds clean by washing them gently with  soap and water when you bathe. Besides skin care, treatment may include medicines, laser treatment, and surgery. This information is not intended to replace advice given to you by your health care provider. Make sure you discuss any questions you have with your health care provider. Document Revised: 10/27/2021 Document Reviewed: 10/27/2021 Elsevier Patient Education  2023 Elsevier Inc.  

## 2022-10-17 NOTE — Progress Notes (Addendum)
Subjective:  Patient ID: Ashlee Fox, female    DOB: 04/11/1986  Age: 37 y.o. MRN: [id number]  CC: Hypertension   HPI [name] is a 37 y.o. year old female with a history of hypertension, hidradenitis, PTSD seen for an office visit.   Interval History:  She Complains of boils in her right axilla which are not draining now.  Treatment with antibiotic-doxycycline was 1 year ago.  Complains of congestion and coughing all month. She has had frontal sinus pressure, occasional headache, post nasal drip, ear fullness.  Denies presence of fever, dyspnea, chest pain.  Taking Emergen- C, Mucinex with no much relief.  Her left knee has been hurting ever since she tried to get out of the car and her knee 'popped out of place'. Her car was low sitting. Pain occurs when her knee pops and popping happens spontaneously.  She has also had some problems going up the stairs.  She is concerned that this is happening now after she has lost over 100 pounds.  She Complains of feeling cold all the time and would like to be checked for anemia. She is requesting podiatry referral due to recurrent ingrown toenails in her great toe bilaterally. Past Medical History:  Diagnosis Date   Anemia    Complication of anesthesia    Hx MRSA infection    Hypertension    Morbid obesity ([facility]) 10/27/2017    Past Surgical History:  Procedure Laterality Date   CARPAL TUNNEL RELEASE Right 12/15/2021   Procedure: RIGHT CARPAL TUNNEL RELEASE;  Surgeon: Sherilyn Cooter, MD;  Location: [facility];  Service: Orthopedics;  Laterality: Right;   TONSILLECTOMY     ULNAR NERVE TRANSPOSITION Right 12/15/2021   Procedure: RIGHT ULNAR NERVE DECOMPRESSION/TRANSPOSITION;  Surgeon: Sherilyn Cooter, MD;  Location: [facility];  Service: Orthopedics;  Laterality: Right;    Family History  Problem Relation Age of Onset   Diabetes Maternal Grandmother    Hypertension Maternal Grandmother    Breast  cancer Maternal Uncle    Diabetes Maternal Uncle     Social History   Socioeconomic History   Marital status: Single    Spouse name: Not on file   Number of children: 0   Years of education: Not on file   Highest education level: Professional school degree (e.g., MD, DDS, DVM, JD)  Occupational History   Not on file  Tobacco Use   Smoking status: Never   Smokeless tobacco: Never  Vaping Use   Vaping Use: Never used  Substance and Sexual Activity   Alcohol use: No   Drug use: Yes    Types: Marijuana    Comment: 'avid user'   Sexual activity: Yes    Birth control/protection: None  Other Topics Concern   Not on file  Social History Narrative   Not on file   Social Determinants of Health   Financial Resource Strain: Not on file  Food Insecurity: Not on file  Transportation Needs: No Transportation Needs (01/02/2020)   PRAPARE - Hydrologist (Medical): No    Lack of Transportation (Non-Medical): No  Physical Activity: Not on file  Stress: Not on file  Social Connections: Not on file    Allergies  Allergen Reactions   Amoxicillin Anaphylaxis   Pineapple Itching   Latex Rash    Outpatient Medications Prior to Visit  Medication Sig Dispense Refill   cholecalciferol (VITAMIN D) 1000 units tablet Take 1 capsule by mouth  daily.     IRON PO Take 1 tablet by mouth 2 (two) times daily.     Multiple Vitamin (MULTIVITAMIN WITH MINERALS) TABS Take 1 tablet by mouth daily.     hydrochlorothiazide (HYDRODIURIL) 25 MG tablet Take 1 tablet by mouth once daily 90 tablet 0   metFORMIN (GLUCOPHAGE) 500 MG tablet TAKE 1 TABLET BY MOUTH TWICE DAILY WITH A MEAL 60 tablet 0   naproxen (NAPROSYN) 500 MG tablet TAKE 1 TABLET BY MOUTH TWICE DAILY WITH MEALS AS NEEDED FOR PAIN 60 tablet 0   No facility-administered medications prior to visit.     ROS Review of Systems  Constitutional:  Negative for activity change and appetite change.  HENT:  Positive for  congestion and postnasal drip. Negative for sinus pressure and sore throat.   Respiratory:  Positive for cough. Negative for chest tightness, shortness of breath and wheezing.   Cardiovascular:  Negative for chest pain and palpitations.  Gastrointestinal:  Negative for abdominal distention, abdominal pain and constipation.  Genitourinary: Negative.   Skin:  Positive for rash.  Psychiatric/Behavioral:  Negative for behavioral problems and dysphoric mood.     Objective:  BP (!) 144/87   Pulse 77   Ht 5\' 10"  (1.778 m)   Wt 276 lb 9.6 oz (125.5 kg)   SpO2 96%   BMI 39.69 kg/m      10/17/2022    4:34 PM 10/17/2022    3:52 PM 12/15/2021    2:35 PM  BP/Weight  Systolic BP 035 009 381  Diastolic BP 87 93 80  Wt. (Lbs)  276.6   BMI  39.69 kg/m2       Physical Exam Constitutional:      Appearance: She is well-developed.  HENT:     Right Ear: Tympanic membrane normal.     Left Ear: Tympanic membrane normal.     Mouth/Throat:     Mouth: Mucous membranes are moist.     Pharynx: Posterior oropharyngeal erythema present.     Comments: Postnasal drip Cardiovascular:     Rate and Rhythm: Normal rate.     Heart sounds: Normal heart sounds. No murmur heard. Pulmonary:     Effort: Pulmonary effort is normal.     Breath sounds: Normal breath sounds. No wheezing or rales.  Chest:     Chest wall: No tenderness.  Abdominal:     General: Bowel sounds are normal. There is no distension.     Palpations: Abdomen is soft. There is no mass.     Tenderness: There is no abdominal tenderness.  Musculoskeletal:        General: Normal range of motion.     Right lower leg: No edema.     Left lower leg: No edema.  Skin:    Comments: Right axilla with abscesses in different stages, no drainage  Neurological:     Mental Status: She is alert and oriented to person, place, and time.  Psychiatric:        Mood and Affect: Mood normal.        Latest Ref Rng & Units 12/13/2021    1:24 PM  10/19/2021   12:08 PM 04/28/2021    8:51 AM  CMP  Glucose 70 - 99 mg/dL 95  74  80   BUN 6 - 20 mg/dL 7  8  8    Creatinine 0.44 - 1.00 mg/dL 0.65  0.56  0.65   Sodium 135 - 145 mmol/L 138  141  138  Potassium 3.5 - 5.1 mmol/L 3.6  3.8  3.5   Chloride 98 - 111 mmol/L 106  104  99   CO2 22 - 32 mmol/L 27  21  22    Calcium 8.9 - 10.3 mg/dL 9.2  9.2  9.3   Total Protein 6.0 - 8.5 g/dL   7.5   Total Bilirubin 0.0 - 1.2 mg/dL   0.4   Alkaline Phos 44 - 121 IU/L   56   AST 0 - 40 IU/L   11   ALT 0 - 32 IU/L   12     Lipid Panel     Component Value Date/Time   CHOL 151 04/28/2021 0851   TRIG 81 04/28/2021 0851   HDL 36 (L) 04/28/2021 0851   CHOLHDL 4.2 04/28/2021 0851   LDLCALC 99 04/28/2021 0851    CBC    Component Value Date/Time   WBC 9.6 05/13/2012 1515   RBC 4.10 05/13/2012 1515   HGB 11.6 (L) 05/13/2012 1515   HCT 36.2 05/13/2012 1515   PLT 305 05/13/2012 1515   MCV 88.3 05/13/2012 1515   MCH 28.3 05/13/2012 1515   MCHC 32.0 05/13/2012 1515   RDW 13.6 05/13/2012 1515   LYMPHSABS 3.7 05/13/2012 1515   MONOABS 0.7 05/13/2012 1515   EOSABS 0.1 05/13/2012 1515   BASOSABS 0.0 05/13/2012 1515    Lab Results  Component Value Date   HGBA1C 5.0 04/28/2021    Assessment & Plan:  1. 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 - DG Knee Complete 4 Views Left; Future - AMB referral to orthopedics  2. Ingrown toenail - Ambulatory referral to Podiatry  3. Hidradenitis suppurativa Advised to apply warm compress - doxycycline (VIBRA-TABS) 100 MG tablet; Take 1 tablet (100 mg total) by mouth 2 (two) times daily.  Dispense: 14 tablet; Refill: 0 - CBC with Differential/Platelet - chlorhexidine (HIBICLENS) 4 % external liquid; Apply topically daily as needed.  Dispense: 120 mL; Refill: 0  4. Nasal congestion Symptoms of chronic sinusitis - fluticasone (FLONASE) 50 MCG/ACT  nasal spray; Place 2 sprays into both nostrils daily.  Dispense: 16 g; Refill: 1 - cetirizine (ZYRTEC) 10 MG tablet; Take 1 tablet (10 mg total) by mouth daily.  Dispense: 30 tablet; Refill: 1  5. Cold intolerance - T4, free - TSH - T3  6. Essential hypertension Not fully optimized Switch from HCTZ to chlorthalidone Counseled on blood pressure goal of less than 130/80, low-sodium, DASH diet, medication compliance, 150 minutes of moderate intensity exercise per week. Discussed medication compliance, adverse effects. - CMP14+EGFR - chlorthalidone (HYGROTON) 25 MG tablet; Take 1 tablet (25 mg total) by mouth daily.  Dispense: 90 tablet; Refill: 1  Meds ordered this encounter  Medications   doxycycline (VIBRA-TABS) 100 MG tablet    Sig: Take 1 tablet (100 mg total) by mouth 2 (two) times daily.    Dispense:  14 tablet    Refill:  0   chlorhexidine (HIBICLENS) 4 % external liquid    Sig: Apply topically daily as needed.    Dispense:  120 mL    Refill:  0   fluticasone (FLONASE) 50 MCG/ACT nasal spray    Sig: Place 2 sprays into both nostrils daily.    Dispense:  16 g    Refill:  1   cetirizine (ZYRTEC) 10 MG tablet    Sig: Take 1 tablet (10 mg total)  by mouth daily.    Dispense:  30 tablet    Refill:  1   chlorthalidone (HYGROTON) 25 MG tablet    Sig: Take 1 tablet (25 mg total) by mouth daily.    Dispense:  90 tablet    Refill:  1    Discontinue hydrochlorothiazide      Follow-up: Return in about 2 months (around 12/16/2022) for CPE/ Preventive Health Exam.    Addendum: Note had been completed and signed but then I received a message via MyChart with the patient requesting to revert back to hydrochlorothiazide as she was not in agreement with switching it to chlorthalidone as she felt elevated blood pressure was due to her not feeling well.  I have sent a prescription for hydrochlorothiazide to her pharmacy.   Hoy Register, MD, FAAFP. [facility] [place], [place] [phone]   10/17/2022, 5:06 PM

## 2022-10-18 ENCOUNTER — Other Ambulatory Visit: Payer: Self-pay | Admitting: Family Medicine

## 2022-10-18 LAB — CMP14+EGFR
ALT: 20 IU/L (ref 0–32)
AST: 13 IU/L (ref 0–40)
Albumin/Globulin Ratio: 1.1 — ABNORMAL LOW (ref 1.2–2.2)
Albumin: 3.9 g/dL (ref 3.9–4.9)
Alkaline Phosphatase: 53 IU/L (ref 44–121)
BUN/Creatinine Ratio: 14 (ref 9–23)
BUN: 9 mg/dL (ref 6–20)
Bilirubin Total: 0.3 mg/dL (ref 0.0–1.2)
CO2: 28 mmol/L (ref 20–29)
Calcium: 9.3 mg/dL (ref 8.7–10.2)
Chloride: 99 mmol/L (ref 96–106)
Creatinine, Ser: 0.65 mg/dL (ref 0.57–1.00)
Globulin, Total: 3.6 g/dL (ref 1.5–4.5)
Glucose: 77 mg/dL (ref 70–99)
Potassium: 3.6 mmol/L (ref 3.5–5.2)
Sodium: 141 mmol/L (ref 134–144)
Total Protein: 7.5 g/dL (ref 6.0–8.5)
eGFR: 116 mL/min/{1.73_m2} (ref >59)

## 2022-10-18 LAB — CBC WITH DIFFERENTIAL/PLATELET
Basophils Absolute: 0 10*3/uL (ref 0.0–0.2)
Basos: 0 %
EOS (ABSOLUTE): 0.3 10*3/uL (ref 0.0–0.4)
Eos: 3 %
Hematocrit: 33 % — ABNORMAL LOW (ref 34.0–46.6)
Hemoglobin: 10.8 g/dL — ABNORMAL LOW (ref 11.1–15.9)
Immature Grans (Abs): 0 10*3/uL (ref 0.0–0.1)
Immature Granulocytes: 0 %
Lymphocytes Absolute: 4.2 10*3/uL — ABNORMAL HIGH (ref 0.7–3.1)
Lymphs: 46 %
MCH: 30.9 pg (ref 26.6–33.0)
MCHC: 32.7 g/dL (ref 31.5–35.7)
MCV: 95 fL (ref 79–97)
Monocytes Absolute: 0.7 10*3/uL (ref 0.1–0.9)
Monocytes: 7 %
Neutrophils Absolute: 4.1 10*3/uL (ref 1.4–7.0)
Neutrophils: 44 %
Platelets: 311 10*3/uL (ref 150–450)
RBC: 3.49 x10E6/uL — ABNORMAL LOW (ref 3.77–5.28)
RDW: 11.4 % — ABNORMAL LOW (ref 11.7–15.4)
WBC: 9.4 10*3/uL (ref 3.4–10.8)

## 2022-10-18 LAB — T4, FREE: Free T4: 1.11 ng/dL (ref 0.82–1.77)

## 2022-10-18 LAB — T3: T3, Total: 98 ng/dL (ref 71–180)

## 2022-10-18 LAB — TSH: TSH: 2.28 u[IU]/mL (ref 0.450–4.500)

## 2022-10-18 MED ORDER — IRON (FERROUS SULFATE) 325 (65 FE) MG PO TABS: 325.0000 mg | ORAL_TABLET | Freq: Two times a day (BID) | ORAL | 3 refills | Status: DC

## 2022-10-20 DIAGNOSIS — Z419 Encounter for procedure for purposes other than remedying health state, unspecified: Secondary | ICD-10-CM | POA: Diagnosis not present

## 2022-10-31 ENCOUNTER — Ambulatory Visit (INDEPENDENT_AMBULATORY_CARE_PROVIDER_SITE_OTHER): Payer: 59 | Admitting: Orthopedic Surgery

## 2022-10-31 ENCOUNTER — Encounter: Payer: Self-pay | Admitting: Orthopedic Surgery

## 2022-10-31 ENCOUNTER — Ambulatory Visit (INDEPENDENT_AMBULATORY_CARE_PROVIDER_SITE_OTHER): Payer: 59

## 2022-10-31 DIAGNOSIS — M25562 Pain in left knee: Secondary | ICD-10-CM | POA: Diagnosis not present

## 2022-10-31 DIAGNOSIS — G8929 Other chronic pain: Secondary | ICD-10-CM | POA: Diagnosis not present

## 2022-10-31 NOTE — Progress Notes (Signed)
Office Visit Note   Patient: Ashlee Fox           Date of Birth: [birth year]           MRN: [id number] Visit Date: 10/31/2022              Requested by: Charlott Rakes, MD [address] [place],  [place] [address] PCP: Charlott Rakes, MD  Chief Complaint  Patient presents with   Left Knee - Pain      HPI: Patient is a 37 year old woman who is seen for evaluation for acute pain left knee with chronic symptoms.  Patient states that she had an acute pop when she got out of her low sitting court.  She states she mainly has popping and crepitation with range of motion.  Assessment & Plan: Visit Diagnoses:  1. Chronic pain of left knee     Plan: Recommended quad VMO strengthening to improve the alignment of the patella strengthen the muscles around the knee to unload stress from the joint.  Follow-Up Instructions: Return if symptoms worsen or fail to improve.   Ortho Exam  Patient is alert, oriented, no adenopathy, well-dressed, normal affect, normal respiratory effort. Examination patient has no effusion of either knee.  Her patella tracks midline.  The medial patellofemoral ligament is not tender to palpation no tenderness to palpation laterally however she does have crepitation of the patellofemoral joint with range of motion.  The medial and lateral joint line are nontender to palpation.  Imaging: XR Knee 1-2 Views Left  Result Date: 10/31/2022 2 view radiographs of the left knee shows mild medial joint line narrowing with small periosteal bony spurs with lateral tracking of the patella bilaterally and symmetric.  No images are attached to the encounter.  Labs: Lab Results  Component Value Date   HGBA1C 5.0 04/28/2021   HGBA1C 5.1 07/23/2020   HGBA1C 5.1 11/15/2019   REPTSTATUS 10/28/2014 FINAL 10/27/2014   GRAMSTAIN  05/21/2008    FEW WBC PRESENT, PREDOMINANTLY PMN NO SQUAMOUS EPITHELIAL CELLS SEEN FEW GRAM POSITIVE COCCI IN PAIRS IN CHAINS IN  CLUSTERS   CULT NO GROWTH Performed at [facility]  10/27/2014   LABORGA METHICILLIN RESISTANT STAPHYLOCOCCUS AUREUS 05/21/2008     Lab Results  Component Value Date   ALBUMIN 3.9 10/17/2022   ALBUMIN 3.9 04/28/2021    No results found for: "MG" No results found for: "VD25OH"  No results found for: "PREALBUMIN"    Latest Ref Rng & Units 10/17/2022    4:45 PM 05/13/2012    3:15 PM 12/24/2008    1:58 PM  CBC EXTENDED  WBC 3.4 - 10.8 x10E3/uL 9.4  9.6  8.2   RBC 3.77 - 5.28 x10E6/uL 3.49  4.10  3.82   Hemoglobin 11.1 - 15.9 g/dL 10.8  11.6  11.3   HCT 34.0 - 46.6 % 33.0  36.2  34.0   Platelets 150 - 450 x10E3/uL 311  305  237   NEUT# 1.4 - 7.0 x10E3/uL 4.1  5.0  5.0   Lymph# 0.7 - 3.1 x10E3/uL 4.2  3.7  2.3      There is no height or weight on file to calculate BMI.  Orders:  Orders Placed This Encounter  Procedures   XR Knee 1-2 Views Left   No orders of the defined types were placed in this encounter.    Procedures: No procedures performed  Clinical Data: No additional findings.  ROS:  All other systems negative,  except as noted in the HPI. Review of Systems  Objective: Vital Signs: There were no vitals taken for this visit.  Specialty Comments:  No specialty comments available.  PMFS History: Patient Active Problem List   Diagnosis Date Noted   Cubital tunnel syndrome on right    Ulnar neuropathy at elbow of right upper extremity 12/03/2021   Carpal tunnel syndrome, right upper limb 12/03/2021   PTSD (post-traumatic stress disorder) 08/23/2021   Mild cannabis use disorder 08/23/2021   Hidradenitis suppurativa 12/11/2019   Morbid obesity ([facility]) 10/27/2017   Chlamydia 10/18/2017   Pap smear abnormality of cervix/human papillomavirus (HPV) positive 10/18/2017   Elevated blood pressure reading 10/08/2017   Irregular menses 10/08/2017   Past Medical History:  Diagnosis Date   Anemia    Complication of anesthesia    Hx MRSA infection     Hypertension    Morbid obesity ([facility]) 10/27/2017    Family History  Problem Relation Age of Onset   Diabetes Maternal Grandmother    Hypertension Maternal Grandmother    Breast cancer Maternal Uncle    Diabetes Maternal Uncle     Past Surgical History:  Procedure Laterality Date   CARPAL TUNNEL RELEASE Right 12/15/2021   Procedure: RIGHT CARPAL TUNNEL RELEASE;  Surgeon: Sherilyn Cooter, MD;  Location: [facility];  Service: Orthopedics;  Laterality: Right;   TONSILLECTOMY     ULNAR NERVE TRANSPOSITION Right 12/15/2021   Procedure: RIGHT ULNAR NERVE DECOMPRESSION/TRANSPOSITION;  Surgeon: Sherilyn Cooter, MD;  Location: [facility];  Service: Orthopedics;  Laterality: Right;   Social History   Occupational History   Not on file  Tobacco Use   Smoking status: Never   Smokeless tobacco: Never  Vaping Use   Vaping Use: Never used  Substance and Sexual Activity   Alcohol use: No   Drug use: Yes    Types: Marijuana    Comment: 'avid user'   Sexual activity: Yes    Birth control/protection: None

## 2022-11-02 ENCOUNTER — Ambulatory Visit: Payer: 59 | Admitting: Podiatry

## 2022-11-18 DIAGNOSIS — Z419 Encounter for procedure for purposes other than remedying health state, unspecified: Secondary | ICD-10-CM | POA: Diagnosis not present

## 2022-11-23 ENCOUNTER — Encounter: Payer: Self-pay | Admitting: Podiatry

## 2022-11-23 ENCOUNTER — Ambulatory Visit (INDEPENDENT_AMBULATORY_CARE_PROVIDER_SITE_OTHER): Payer: 59 | Admitting: Podiatry

## 2022-11-23 DIAGNOSIS — B351 Tinea unguium: Secondary | ICD-10-CM

## 2022-11-23 DIAGNOSIS — M21962 Unspecified acquired deformity of left lower leg: Secondary | ICD-10-CM

## 2022-11-23 DIAGNOSIS — M21961 Unspecified acquired deformity of right lower leg: Secondary | ICD-10-CM

## 2022-11-23 DIAGNOSIS — Z79899 Other long term (current) drug therapy: Secondary | ICD-10-CM | POA: Diagnosis not present

## 2022-11-23 DIAGNOSIS — Q666 Other congenital valgus deformities of feet: Secondary | ICD-10-CM

## 2022-11-23 NOTE — Progress Notes (Signed)
Subjective:  Patient ID: Ashlee Fox, female    DOB: [birth year],  MRN: [id number]  Chief Complaint  Patient presents with   Nail Problem    Nail discomfort     37 y.o. female presents with the above complaint.  Patient presents with complaint of Bilateral hallux and bilateral second digit thickened elongated dystrophic mycotic nails x 4.  She states that she would like to have discuss treatment options for it.  She also has underlying flatfoot deformity for which she would like to do orthotics she started noticing bulging heel pain as well.  She has not seen MRIs prior to seeing me pain scale 7 out of 10  Review of Systems: Negative except as noted in the HPI. Denies N/V/F/Ch.  Past Medical History:  Diagnosis Date   Anemia    Complication of anesthesia    Hx MRSA infection    Hypertension    Morbid obesity ([facility]) 10/27/2017    Current Outpatient Medications:    cetirizine (ZYRTEC) 10 MG tablet, Take 1 tablet (10 mg total) by mouth daily., Disp: 30 tablet, Rfl: 1   chlorhexidine (HIBICLENS) 4 % external liquid, Apply topically daily as needed., Disp: 120 mL, Rfl: 0   cholecalciferol (VITAMIN D) 1000 units tablet, Take 1 capsule by mouth daily., Disp: , Rfl:    doxycycline (VIBRA-TABS) 100 MG tablet, Take 1 tablet (100 mg total) by mouth 2 (two) times daily., Disp: 14 tablet, Rfl: 0   fluticasone (FLONASE) 50 MCG/ACT nasal spray, Place 2 sprays into both nostrils daily., Disp: 16 g, Rfl: 1   hydrochlorothiazide (HYDRODIURIL) 25 MG tablet, Take 1 tablet (25 mg total) by mouth daily., Disp: 90 tablet, Rfl: 1   IRON PO, Take 1 tablet by mouth 2 (two) times daily., Disp: , Rfl:    Iron, Ferrous Sulfate, 325 (65 Fe) MG TABS, Take 325 mg by mouth 2 (two) times daily., Disp: 60 tablet, Rfl: 3   metFORMIN (GLUCOPHAGE) 500 MG tablet, TAKE 1 TABLET BY MOUTH TWICE DAILY WITH A MEAL, Disp: 60 tablet, Rfl: 0   Multiple Vitamin (MULTIVITAMIN WITH MINERALS) TABS, Take 1 tablet by mouth daily.,  Disp: , Rfl:    naproxen (NAPROSYN) 500 MG tablet, TAKE 1 TABLET BY MOUTH TWICE DAILY WITH MEALS AS NEEDED FOR PAIN, Disp: 60 tablet, Rfl: 0   terbinafine (LAMISIL) 250 MG tablet, Take 1 tablet (250 mg total) by mouth daily., Disp: 90 tablet, Rfl: 0  Social History   Tobacco Use  Smoking Status Never  Smokeless Tobacco Never    Allergies  Allergen Reactions   Amoxicillin Anaphylaxis   Pineapple Itching   Latex Rash   Objective:  There were no vitals filed for this visit. There is no height or weight on file to calculate BMI. Constitutional Well developed. Well nourished.  Vascular Dorsalis pedis pulses palpable bilaterally. Posterior tibial pulses palpable bilaterally. Capillary refill normal to all digits.  No cyanosis or clubbing noted. Pedal hair growth normal.  Neurologic Normal speech. Oriented to person, place, and time. Epicritic sensation to light touch grossly present bilaterally.  Dermatologic Nails thickened elongated dystrophic bilateral hallux and second digit.  Mild pain on palpation Skin within normal limits  Orthopedic: Bilateral calcaneovalgus noted with Calcaneovalgus to many toe signs partially recruit the arch with dorsiflexion of the hallux.   Radiographs: None Assessment:   1. Long-term use of high-risk medication   2. Nail fungus   3. Onychomycosis due to dermatophyte   4. Pes planovalgus  Plan:  Patient was evaluated and treated and all questions answered.  Bilateral hallux and second digit onychomycosis/nail fungus -Educated the patient on the etiology of onychomycosis and various treatment options associated with improving the fungal load.  I explained to the patient that there is 3 treatment options available to treat the onychomycosis including topical, p.o., laser treatment.  Patient elected to undergo p.o. options with Lamisil/terbinafine therapy.  In order for me to start the medication therapy, I explained to the patient the importance  of evaluating the liver and obtaining the liver function test.  Once the liver function test comes back normal I will start him on 23-month course of Lamisil therapy.  Patient understood all risk and would like to proceed with Lamisil therapy.  I have asked the patient to immediately stop the Lamisil therapy if she has any reactions to it and call the office or go to the emergency room right away.  Patient states understanding  Pes planovalgus/foot deformity -I explained to patient the etiology of pes planovalgus and relationship with Planter fasciitis and various treatment options were discussed.  Given patient foot structure in the setting of Planter fasciitis I believe patient will benefit from custom-made orthotics to help control the hindfoot motion support the arch of the foot and take the stress away from plantar fascial.  Patient agrees with the plan like to proceed with orthotics -Patient was casted for orthotics    No follow-ups on file.  Pes planovalgus orthotics  Bilateral hallux and second digit onychomycosis liver function test Lamisil

## 2022-11-24 ENCOUNTER — Other Ambulatory Visit: Payer: Self-pay | Admitting: Podiatry

## 2022-11-25 LAB — HEPATIC FUNCTION PANEL
ALT: 14 IU/L (ref 0–32)
AST: 11 IU/L (ref 0–40)
Albumin: 3.5 g/dL — ABNORMAL LOW (ref 3.9–4.9)
Alkaline Phosphatase: 63 IU/L (ref 44–121)
Bilirubin Total: <0.2 mg/dL (ref 0.0–1.2)
Bilirubin, Direct: <0.1 mg/dL (ref 0.00–0.40)
Total Protein: 6.7 g/dL (ref 6.0–8.5)

## 2022-11-25 MED ORDER — TERBINAFINE HCL 250 MG PO TABS: 250.0000 mg | ORAL_TABLET | Freq: Every day | ORAL | 0 refills | Status: DC

## 2022-11-25 NOTE — Addendum Note (Signed)
Addended by: Boneta Lucks on: 11/25/2022 08:28 AM   Modules accepted: Orders

## 2022-11-30 ENCOUNTER — Telehealth: Payer: Self-pay

## 2022-12-01 NOTE — Telephone Encounter (Signed)
Patient is aware and will reach out to her pharmacy.

## 2022-12-08 ENCOUNTER — Other Ambulatory Visit: Payer: Self-pay | Admitting: Podiatry

## 2022-12-08 DIAGNOSIS — Q666 Other congenital valgus deformities of feet: Secondary | ICD-10-CM

## 2022-12-19 DIAGNOSIS — Z419 Encounter for procedure for purposes other than remedying health state, unspecified: Secondary | ICD-10-CM | POA: Diagnosis not present

## 2023-01-02 ENCOUNTER — Ambulatory Visit (INDEPENDENT_AMBULATORY_CARE_PROVIDER_SITE_OTHER): Payer: 59

## 2023-01-02 DIAGNOSIS — Q666 Other congenital valgus deformities of feet: Secondary | ICD-10-CM

## 2023-01-02 NOTE — Progress Notes (Signed)
Patient presents today to pick up custom molded foot orthotics recommended by Dr. PATEL.   Orthotics were dispensed and fit was satisfactory. Reviewed instructions for break-in and wear. Written instructions given to patient.  Patient will follow up as needed.   

## 2023-01-09 ENCOUNTER — Ambulatory Visit: Payer: 59 | Attending: Family Medicine | Admitting: Family Medicine

## 2023-01-09 ENCOUNTER — Encounter: Payer: Self-pay | Admitting: Family Medicine

## 2023-01-09 ENCOUNTER — Other Ambulatory Visit (HOSPITAL_COMMUNITY)
Admission: RE | Admit: 2023-01-09 | Discharge: 2023-01-09 | Disposition: A | Discharge status: Home / Self Care | Payer: 59 | Source: Ambulatory Visit | Attending: Family Medicine | Admitting: Family Medicine

## 2023-01-09 VITALS — BP 149/97 | HR 69 | Wt 280.0 lb

## 2023-01-09 DIAGNOSIS — Z124 Encounter for screening for malignant neoplasm of cervix: Secondary | ICD-10-CM | POA: Insufficient documentation

## 2023-01-09 DIAGNOSIS — L732 Hidradenitis suppurativa: Secondary | ICD-10-CM

## 2023-01-09 DIAGNOSIS — Z0001 Encounter for general adult medical examination with abnormal findings: Secondary | ICD-10-CM | POA: Diagnosis not present

## 2023-01-09 DIAGNOSIS — Z113 Encounter for screening for infections with a predominantly sexual mode of transmission: Secondary | ICD-10-CM | POA: Diagnosis not present

## 2023-01-09 NOTE — Progress Notes (Signed)
Subjective:  Patient ID: Ashlee Fox, female    DOB: Nov 15, 1985  Age: 37 y.o. MRN: [id number]  CC: Annual Exam (Patient also states pain(Side)during sex )   HPI Ashlee Fox is a 37 y.o. year old female with a history of hypertension, hidradenitis, PTSD.  Interval History:   She presents today for an annual physical exam.  She is due for Pap smear. Endorses presence of right pelvic pain during intercourse but no additional symptoms. Does not see a dentist regularly but just got medical insurance and plans to schedule an appointment and also scheduled appointment with ophthalmology. 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  Past Medical History:  Diagnosis Date   Anemia    Complication of anesthesia    Hx MRSA infection    Hypertension    Morbid obesity 10/27/2017    Past Surgical History:  Procedure Laterality Date   CARPAL TUNNEL RELEASE Right 12/15/2021   Procedure: RIGHT CARPAL TUNNEL RELEASE;  Surgeon: Marlyne Beards, MD;  Location: [facility];  Service: Orthopedics;  Laterality: Right;   TONSILLECTOMY     ULNAR NERVE TRANSPOSITION Right 12/15/2021   Procedure: RIGHT ULNAR NERVE DECOMPRESSION/TRANSPOSITION;  Surgeon: Marlyne Beards, MD;  Location: [facility];  Service: Orthopedics;  Laterality: Right;    Family History  Problem Relation Age of Onset   Diabetes Maternal Grandmother    Hypertension Maternal Grandmother    Breast cancer Maternal Uncle    Diabetes Maternal Uncle     Social History   Socioeconomic History   Marital status: Single    Spouse name: Not on file   Number of children: 0   Years of education: Not on file   Highest education level: Professional school degree (e.g., MD, DDS, DVM, JD)  Occupational History   Not on file  Tobacco Use   Smoking status: Never   Smokeless tobacco: Never  Vaping Use   Vaping Use: Never used  Substance and Sexual Activity    Alcohol use: No   Drug use: Yes    Types: Marijuana    Comment: 'avid user'   Sexual activity: Yes    Birth control/protection: None  Other Topics Concern   Not on file  Social History Narrative   Not on file   Social Determinants of Health   Financial Resource Strain: Not on file  Food Insecurity: Not on file  Transportation Needs: No Transportation Needs (01/02/2020)   PRAPARE - Administrator, Civil Service (Medical): No    Lack of Transportation (Non-Medical): No  Physical Activity: Not on file  Stress: Not on file  Social Connections: Not on file    Allergies  Allergen Reactions   Amoxicillin Anaphylaxis   Pineapple Itching   Latex Rash    Outpatient Medications Prior to Visit  Medication Sig Dispense Refill   cetirizine (ZYRTEC) 10 MG tablet Take 1 tablet (10 mg total) by mouth daily. 30 tablet 1   chlorhexidine (HIBICLENS) 4 % external liquid Apply topically daily as needed. 120 mL 0   cholecalciferol (VITAMIN D) 1000 units tablet Take 1 capsule by mouth daily.     doxycycline (VIBRA-TABS) 100 MG tablet Take 1 tablet (100 mg total) by mouth 2 (two) times daily. 14 tablet 0   fluticasone (FLONASE) 50 MCG/ACT nasal spray Place 2 sprays into both nostrils daily. 16 g 1   hydrochlorothiazide (HYDRODIURIL) 25 MG tablet Take 1 tablet (  25 mg total) by mouth daily. 90 tablet 1   Iron, Ferrous Sulfate, 325 (65 Fe) MG TABS Take 325 mg by mouth 2 (two) times daily. 60 tablet 3   Multiple Vitamin (MULTIVITAMIN WITH MINERALS) TABS Take 1 tablet by mouth daily.     terbinafine (LAMISIL) 250 MG tablet Take 1 tablet (250 mg total) by mouth daily. 90 tablet 0   IRON PO Take 1 tablet by mouth 2 (two) times daily.     metFORMIN (GLUCOPHAGE) 500 MG tablet TAKE 1 TABLET BY MOUTH TWICE DAILY WITH A MEAL 60 tablet 0   naproxen (NAPROSYN) 500 MG tablet TAKE 1 TABLET BY MOUTH TWICE DAILY WITH MEALS AS NEEDED FOR PAIN 60 tablet 0   No facility-administered medications prior to  visit.     ROS Review of Systems  Constitutional:  Negative for activity change and appetite change.  HENT:  Negative for sinus pressure and sore throat.   Respiratory:  Negative for chest tightness, shortness of breath and wheezing.   Cardiovascular:  Negative for chest pain and palpitations.  Gastrointestinal:  Negative for abdominal distention, abdominal pain and constipation.  Genitourinary: Negative.   Musculoskeletal: Negative.   Skin:  Positive for rash.  Psychiatric/Behavioral:  Negative for behavioral problems and dysphoric mood.     Objective:  BP (!) 149/97   Pulse 69   Wt 280 lb (127 kg)   SpO2 100%   BMI 40.18 kg/m      01/09/2023    5:13 PM 01/09/2023    4:50 PM 10/17/2022    4:34 PM  BP/Weight  Systolic BP 149 145 144  Diastolic BP 97 94 87  Wt. (Lbs)  280   BMI  40.18 kg/m2       Physical Exam Exam conducted with a chaperone present.  Constitutional:      General: She is not in acute distress.    Appearance: She is well-developed. She is not diaphoretic.  HENT:     Head: Normocephalic.     Right Ear: External ear normal.     Left Ear: External ear normal.     Nose: Nose normal.  Eyes:     Conjunctiva/sclera: Conjunctivae normal.     Pupils: Pupils are equal, round, and reactive to light.  Neck:     Vascular: No JVD.  Cardiovascular:     Rate and Rhythm: Normal rate and regular rhythm.     Heart sounds: Normal heart sounds. No murmur heard.    No gallop.  Pulmonary:     Effort: Pulmonary effort is normal. No respiratory distress.     Breath sounds: Normal breath sounds. No wheezing or rales.  Chest:     Chest wall: No tenderness.  Breasts:    Right: Normal. No mass, nipple discharge or tenderness.     Left: Normal. No mass, nipple discharge or tenderness.  Abdominal:     General: Bowel sounds are normal. There is no distension.     Palpations: Abdomen is soft. There is no mass.     Tenderness: There is no abdominal tenderness.      Hernia: There is no hernia in the left inguinal area or right inguinal area.  Genitourinary:    General: Normal vulva.     Pubic Area: No rash.      Labia:        Right: No rash.        Left: No rash.      Vagina: Normal.  Cervix: Normal.     Uterus: Normal.      Adnexa: Right adnexa normal and left adnexa normal.       Right: No tenderness.         Left: No tenderness.       Comments: Right CMT tenderness Musculoskeletal:        General: No tenderness. Normal range of motion.     Cervical back: Normal range of motion. No tenderness.  Lymphadenopathy:     Upper Body:     Right upper body: No supraclavicular or axillary adenopathy.     Left upper body: No supraclavicular or axillary adenopathy.  Skin:    General: Skin is warm and dry.     Comments: Furuncles in different stages of healing in right axilla  Neurological:     Mental Status: She is alert and oriented to person, place, and time.     Deep Tendon Reflexes: Reflexes are normal and symmetric.        Latest Ref Rng & Units 11/24/2022    4:06 PM 10/17/2022    4:45 PM 12/13/2021    1:24 PM  CMP  Glucose 70 - 99 mg/dL  77  95   BUN 6 - 20 mg/dL  9  7   Creatinine 3.24 - 1.00 mg/dL  4.01  0.27   Sodium 253 - 144 mmol/L  141  138   Potassium 3.5 - 5.2 mmol/L  3.6  3.6   Chloride 96 - 106 mmol/L  99  106   CO2 20 - 29 mmol/L  28  27   Calcium 8.7 - 10.2 mg/dL  9.3  9.2   Total Protein 6.0 - 8.5 g/dL 6.7  7.5    Total Bilirubin 0.0 - 1.2 mg/dL <6.6  0.3    Alkaline Phos 44 - 121 IU/L 63  53    AST 0 - 40 IU/L 11  13    ALT 0 - 32 IU/L 14  20      Lipid Panel     Component Value Date/Time   CHOL 151 04/28/2021 0851   TRIG 81 04/28/2021 0851   HDL 36 (L) 04/28/2021 0851   CHOLHDL 4.2 04/28/2021 0851   LDLCALC 99 04/28/2021 0851    CBC    Component Value Date/Time   WBC 9.4 10/17/2022 1645   WBC 9.6 05/13/2012 1515   RBC 3.49 (L) 10/17/2022 1645   RBC 4.10 05/13/2012 1515   HGB 10.8 (L) 10/17/2022 1645    HCT 33.0 (L) 10/17/2022 1645   PLT 311 10/17/2022 1645   MCV 95 10/17/2022 1645   MCH 30.9 10/17/2022 1645   MCH 28.3 05/13/2012 1515   MCHC 32.7 10/17/2022 1645   MCHC 32.0 05/13/2012 1515   RDW 11.4 (L) 10/17/2022 1645   LYMPHSABS 4.2 (H) 10/17/2022 1645   MONOABS 0.7 05/13/2012 1515   EOSABS 0.3 10/17/2022 1645   BASOSABS 0.0 10/17/2022 1645    Lab Results  Component Value Date   HGBA1C 5.0 04/28/2021    Assessment & Plan:  1. Annual visit for general adult medical examination with abnormal findings Counseled on 150 minutes of exercise per week, healthy eating (including decreased daily intake of saturated fats, cholesterol, added sugars, sodium), routine healthcare maintenance.   2. Screening for cervical cancer - Cytology - PAP  3. Screening for STD (sexually transmitted disease) She has left sided pelvic pain during intercourse Will perform STD screen - Cervicovaginal ancillary only  4. Hidradenitis suppurativa Symptoms  did not improve with antibiotics Advised to use warm compress She plans to go to urgent care to have this She might benefit from excision by general surgery - Ambulatory referral to Dermatology   No orders of the defined types were placed in this encounter.   Follow-up: Return in about 6 months (around 07/11/2023) for Chronic medical conditions.       Hoy Register, MD, FAAFP. [facility] [place], [place] [phone]   01/09/2023, 5:21 PM

## 2023-01-09 NOTE — Patient Instructions (Signed)

## 2023-01-11 ENCOUNTER — Other Ambulatory Visit: Payer: Self-pay | Admitting: Family Medicine

## 2023-01-11 LAB — CERVICOVAGINAL ANCILLARY ONLY
Bacterial Vaginitis (gardnerella): POSITIVE — AB
Candida Glabrata: NEGATIVE
Candida Vaginitis: NEGATIVE
Chlamydia: NEGATIVE
Comment: NEGATIVE
Comment: NEGATIVE
Comment: NEGATIVE
Comment: NEGATIVE
Comment: NEGATIVE
Comment: NORMAL
Neisseria Gonorrhea: NEGATIVE
Trichomonas: NEGATIVE

## 2023-01-11 MED ORDER — METRONIDAZOLE 500 MG PO TABS: 500.0000 mg | ORAL_TABLET | Freq: Two times a day (BID) | ORAL | 0 refills | Status: AC

## 2023-01-13 LAB — CYTOLOGY - PAP
Comment: NEGATIVE
Diagnosis: NEGATIVE
Diagnosis: REACTIVE
High risk HPV: NEGATIVE

## 2023-01-18 DIAGNOSIS — Z419 Encounter for procedure for purposes other than remedying health state, unspecified: Secondary | ICD-10-CM | POA: Diagnosis not present

## 2023-02-08 ENCOUNTER — Other Ambulatory Visit: Payer: Self-pay | Admitting: Family Medicine

## 2023-02-18 DIAGNOSIS — Z419 Encounter for procedure for purposes other than remedying health state, unspecified: Secondary | ICD-10-CM | POA: Diagnosis not present

## 2023-02-22 ENCOUNTER — Other Ambulatory Visit: Payer: Self-pay | Admitting: Podiatry

## 2023-03-02 ENCOUNTER — Telehealth: Payer: Self-pay | Admitting: Podiatry

## 2023-03-02 NOTE — Telephone Encounter (Signed)
Pt Requesting Rx Lamisil. Pt stated that she is noticing a big difference and that its working.   Please advise

## 2023-03-16 ENCOUNTER — Ambulatory Visit (INDEPENDENT_AMBULATORY_CARE_PROVIDER_SITE_OTHER): Payer: Medicaid Other | Admitting: Podiatry

## 2023-03-16 DIAGNOSIS — Z79899 Other long term (current) drug therapy: Secondary | ICD-10-CM

## 2023-03-16 DIAGNOSIS — B351 Tinea unguium: Secondary | ICD-10-CM | POA: Diagnosis not present

## 2023-03-16 NOTE — Progress Notes (Signed)
Subjective:  Patient ID: Ashlee Fox, female    DOB: January 07, 1986,  MRN: [id number]  Chief Complaint  Patient presents with   Nail Problem    Fungus follow up     37 y.o. female presents with the above complaint.  Patient presents for follow-up of bilateral hallux bilateral second digit nail fungus.  She states the Lamisil helped some.  She would like to discuss next treatment plan.  She was able to tolerate the medication  Review of Systems: Negative except as noted in the HPI. Denies N/V/F/Ch.  Past Medical History:  Diagnosis Date   Anemia    Complication of anesthesia    Hx MRSA infection    Hypertension    Morbid obesity (HCC) 10/27/2017    Current Outpatient Medications:    cetirizine (ZYRTEC) 10 MG tablet, Take 1 tablet (10 mg total) by mouth daily., Disp: 30 tablet, Rfl: 1   chlorhexidine (HIBICLENS) 4 % external liquid, Apply topically daily as needed., Disp: 120 mL, Rfl: 0   cholecalciferol (VITAMIN D) 1000 units tablet, Take 1 capsule by mouth daily., Disp: , Rfl:    doxycycline (VIBRA-TABS) 100 MG tablet, Take 1 tablet (100 mg total) by mouth 2 (two) times daily., Disp: 14 tablet, Rfl: 0   ferrous sulfate (SV IRON) 325 (65 FE) MG tablet, Take 1 tablet by mouth twice daily, Disp: 180 tablet, Rfl: 0   fluticasone (FLONASE) 50 MCG/ACT nasal spray, Place 2 sprays into both nostrils daily., Disp: 16 g, Rfl: 1   hydrochlorothiazide (HYDRODIURIL) 25 MG tablet, Take 1 tablet (25 mg total) by mouth daily., Disp: 90 tablet, Rfl: 1   metFORMIN (GLUCOPHAGE) 500 MG tablet, TAKE 1 TABLET BY MOUTH TWICE DAILY WITH A MEAL, Disp: 60 tablet, Rfl: 0   Multiple Vitamin (MULTIVITAMIN WITH MINERALS) TABS, Take 1 tablet by mouth daily., Disp: , Rfl:    naproxen (NAPROSYN) 500 MG tablet, TAKE 1 TABLET BY MOUTH TWICE DAILY WITH MEALS AS NEEDED FOR PAIN, Disp: 60 tablet, Rfl: 0   terbinafine (LAMISIL) 250 MG tablet, Take 1 tablet (250 mg total) by mouth daily., Disp: 90 tablet, Rfl: 0  Social  History   Tobacco Use  Smoking Status Never  Smokeless Tobacco Never    Allergies  Allergen Reactions   Amoxicillin Anaphylaxis   Pineapple Itching   Latex Rash   Objective:  There were no vitals filed for this visit. There is no height or weight on file to calculate BMI. Constitutional Well developed. Well nourished.  Vascular Dorsalis pedis pulses palpable bilaterally. Posterior tibial pulses palpable bilaterally. Capillary refill normal to all digits.  No cyanosis or clubbing noted. Pedal hair growth normal.  Neurologic Normal speech. Oriented to person, place, and time. Epicritic sensation to light touch grossly present bilaterally.  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   Radiographs: None Assessment:   1. Long-term use of high-risk medication   2. Nail fungus   3. Onychomycosis due to dermatophyte    Plan:  Patient was evaluated and treated and all questions answered.  Bilateral hallux and second digit onychomycosis/nail fungus~second round -Educated the patient on the etiology of onychomycosis and various treatment options associated with improving the fungal load.  I explained to the patient that there is 3 treatment options available to treat the onychomycosis including topical, p.o.,  laser treatment.  Patient elected to undergo p.o. options with Lamisil/terbinafine therapy.  In order for me to start the medication therapy, I explained to the patient the importance of evaluating the liver and obtaining the liver function test.  Once the liver function test comes back normal I will start him on 11-month course of Lamisil therapy.  Patient understood all risk and would like to proceed with Lamisil therapy.  I have asked the  patient to immediately stop the Lamisil therapy if she has any reactions to it and call the office or go to the emergency room right away.  Patient states understanding  Pes planovalgus/foot deformity -I explained to patient the etiology of pes planovalgus and relationship with Planter fasciitis and various treatment options were discussed.  Given patient foot structure in the setting of Planter fasciitis I believe patient will benefit from custom-made orthotics to help control the hindfoot motion support the arch of the foot and take the stress away from plantar fascial.  Patient agrees with the plan like to proceed with orthotics -Patient was casted for orthotics    No follow-ups on file.

## 2023-03-20 DIAGNOSIS — Z419 Encounter for procedure for purposes other than remedying health state, unspecified: Secondary | ICD-10-CM | POA: Diagnosis not present

## 2023-03-29 DIAGNOSIS — Z79899 Other long term (current) drug therapy: Secondary | ICD-10-CM | POA: Diagnosis not present

## 2023-03-30 LAB — HEPATIC FUNCTION PANEL
AG Ratio: 1 (calc) (ref 1.0–2.5)
ALT: 20 U/L (ref 6–29)
AST: 11 U/L (ref 10–30)
Albumin: 3.9 g/dL (ref 3.6–5.1)
Alkaline phosphatase (APISO): 52 U/L (ref 31–125)
Bilirubin, Direct: 0.1 mg/dL (ref 0.0–0.2)
Globulin: 3.9 g/dL (calc) — ABNORMAL HIGH (ref 1.9–3.7)
Indirect Bilirubin: 0.4 mg/dL (calc) (ref 0.2–1.2)
Total Bilirubin: 0.5 mg/dL (ref 0.2–1.2)
Total Protein: 7.8 g/dL (ref 6.1–8.1)

## 2023-03-30 MED ORDER — TERBINAFINE HCL 250 MG PO TABS
250.0000 mg | ORAL_TABLET | Freq: Every day | ORAL | 0 refills | Status: DC
Start: 2023-03-30 — End: 2024-02-08

## 2023-03-30 NOTE — Addendum Note (Signed)
Addended by: Nicholes Rough on: 03/30/2023 07:40 AM   Modules accepted: Orders

## 2023-04-06 ENCOUNTER — Telehealth: Payer: Self-pay | Admitting: Family Medicine

## 2023-04-06 NOTE — Telephone Encounter (Signed)
Copied from CRM [id number]. Topic: General - Inquiry >> Apr 06, 2023 12:15 PM Ashlee Fox wrote: Reason for CRM: pt clled saying she spoke with her obgyn and talked to her about getting pregnant and she suggested that her blood pressure med be changed.  CB#  [phone]

## 2023-04-06 NOTE — Telephone Encounter (Signed)
Pt was called and scheduled an appointment to discuss concern

## 2023-04-18 ENCOUNTER — Ambulatory Visit: Payer: Medicaid Other | Attending: Family Medicine | Admitting: Family Medicine

## 2023-04-18 ENCOUNTER — Encounter: Payer: Self-pay | Admitting: Family Medicine

## 2023-04-18 VITALS — BP 124/82 | HR 84 | Temp 98.2°F | Ht 70.0 in | Wt 280.2 lb

## 2023-04-18 DIAGNOSIS — R221 Localized swelling, mass and lump, neck: Secondary | ICD-10-CM

## 2023-04-18 DIAGNOSIS — Z131 Encounter for screening for diabetes mellitus: Secondary | ICD-10-CM

## 2023-04-18 DIAGNOSIS — Z3169 Encounter for other general counseling and advice on procreation: Secondary | ICD-10-CM | POA: Diagnosis not present

## 2023-04-18 DIAGNOSIS — I10 Essential (primary) hypertension: Secondary | ICD-10-CM

## 2023-04-18 LAB — POCT GLYCOSYLATED HEMOGLOBIN (HGB A1C): Hemoglobin A1C: 5 % (ref 4.0–5.6)

## 2023-04-18 MED ORDER — LABETALOL HCL 200 MG PO TABS: 200.0000 mg | ORAL_TABLET | Freq: Two times a day (BID) | ORAL | 1 refills | Status: DC

## 2023-04-18 NOTE — Progress Notes (Signed)
Subjective:  Patient ID: Ashlee Fox, female    DOB: [birth year]  Age: 37 y.o. MRN: [id number]  CC: Medical Management of Chronic Issues (Discuss changing BP medication/Wants to get pregnant.)   HPI Ashlee Fox is a 37 y.o. year old female with a history of hypertension, hidradenitis, PTSD.   Interval History: Discussed the use of AI scribe software for clinical note transcription with the patient, who gave verbal consent to proceed.   She is planning to conceive and is seeking preconception counseling. She is currently on hydrochlorothiazide 25mg  for blood pressure control, but due to its potential risks in pregnancy, she wishes to switch to a safer alternative. The patient's blood pressure is well controlled at 124/82.  In addition to hypertension, the patient has been trying to conceive without success. She has been using ovulation sticks, but consistently receives negative results despite regular menstrual cycles. She has previously been on metformin for ovulation, but is hesitant to restart it due to the 'need to continue it throughout pregnancy'. She is interested in exploring other options to increase fertility, such as Clomid.  The patient has a history of four pregnancies, all of which ended in loss. She has no living children. She has never been diagnosed with PCOS.     Past Medical History:  Diagnosis Date   Anemia    Complication of anesthesia    Hx MRSA infection    Hypertension    Morbid obesity (HCC) 10/27/2017    Past Surgical History:  Procedure Laterality Date   CARPAL TUNNEL RELEASE Right 12/15/2021   Procedure: RIGHT CARPAL TUNNEL RELEASE;  Surgeon: Marlyne Beards, MD;  Location: [facility];  Service: Orthopedics;  Laterality: Right;   TONSILLECTOMY     ULNAR NERVE TRANSPOSITION Right 12/15/2021   Procedure: RIGHT ULNAR NERVE DECOMPRESSION/TRANSPOSITION;  Surgeon: Marlyne Beards, MD;  Location: [facility];  Service:  Orthopedics;  Laterality: Right;    Family History  Problem Relation Age of Onset   Diabetes Maternal Grandmother    Hypertension Maternal Grandmother    Breast cancer Maternal Uncle    Diabetes Maternal Uncle     Social History   Socioeconomic History   Marital status: Single    Spouse name: Not on file   Number of children: 0   Years of education: Not on file   Highest education level: Professional school degree (e.g., MD, DDS, DVM, JD)  Occupational History   Not on file  Tobacco Use   Smoking status: Never   Smokeless tobacco: Never  Vaping Use   Vaping status: Never Used  Substance and Sexual Activity   Alcohol use: No   Drug use: Yes    Types: Marijuana    Comment: 'avid user'   Sexual activity: Yes    Birth control/protection: None  Other Topics Concern   Not on file  Social History Narrative   Not on file   Social Determinants of Health   Financial Resource Strain: Not on file  Food Insecurity: Not on file  Transportation Needs: No Transportation Needs (01/02/2020)   PRAPARE - Administrator, Civil Service (Medical): No    Lack of Transportation (Non-Medical): No  Physical Activity: Not on file  Stress: Not on file  Social Connections: Not on file    Allergies  Allergen Reactions   Amoxicillin Anaphylaxis   Pineapple Itching   Latex Rash    Outpatient Medications Prior to Visit  Medication Sig Dispense Refill  cholecalciferol (VITAMIN D) 1000 units tablet Take 1 capsule by mouth daily.     ferrous sulfate (SV IRON) 325 (65 FE) MG tablet Take 1 tablet by mouth twice daily 180 tablet 0   Multiple Vitamin (MULTIVITAMIN WITH MINERALS) TABS Take 1 tablet by mouth daily.     terbinafine (LAMISIL) 250 MG tablet Take 1 tablet (250 mg total) by mouth daily. 90 tablet 0   hydrochlorothiazide (HYDRODIURIL) 25 MG tablet Take 1 tablet (25 mg total) by mouth daily. 90 tablet 1   cetirizine (ZYRTEC) 10 MG tablet Take 1 tablet (10 mg total) by mouth  daily. (Patient not taking: Reported on 04/18/2023) 30 tablet 1   chlorhexidine (HIBICLENS) 4 % external liquid Apply topically daily as needed. (Patient not taking: Reported on 04/18/2023) 120 mL 0   doxycycline (VIBRA-TABS) 100 MG tablet Take 1 tablet (100 mg total) by mouth 2 (two) times daily. (Patient not taking: Reported on 04/18/2023) 14 tablet 0   fluticasone (FLONASE) 50 MCG/ACT nasal spray Place 2 sprays into both nostrils daily. (Patient not taking: Reported on 04/18/2023) 16 g 1   metFORMIN (GLUCOPHAGE) 500 MG tablet TAKE 1 TABLET BY MOUTH TWICE DAILY WITH A MEAL 60 tablet 0   naproxen (NAPROSYN) 500 MG tablet TAKE 1 TABLET BY MOUTH TWICE DAILY WITH MEALS AS NEEDED FOR PAIN 60 tablet 0   terbinafine (LAMISIL) 250 MG tablet Take 1 tablet (250 mg total) by mouth daily. (Patient not taking: Reported on 04/18/2023) 90 tablet 0   No facility-administered medications prior to visit.     ROS Review of Systems  Constitutional:  Negative for activity change and appetite change.  HENT:  Negative for sinus pressure and sore throat.   Respiratory:  Negative for chest tightness, shortness of breath and wheezing.   Cardiovascular:  Negative for chest pain and palpitations.  Gastrointestinal:  Negative for abdominal distention, abdominal pain and constipation.  Genitourinary: Negative.   Musculoskeletal: Negative.   Psychiatric/Behavioral:  Negative for behavioral problems and dysphoric mood.     Objective:  BP 124/82   Pulse 84   Temp 98.2 F (36.8 C) (Oral)   Ht 5\' 10"  (1.778 m)   Wt 280 lb 3.2 oz (127.1 kg)   SpO2 100%   BMI 40.20 kg/m      04/18/2023    9:47 AM 01/09/2023    5:13 PM 01/09/2023    4:50 PM  BP/Weight  Systolic BP 124 149 145  Diastolic BP 82 97 94  Wt. (Lbs) 280.2  280  BMI 40.2 kg/m2  40.18 kg/m2      Physical Exam Constitutional:      Appearance: She is well-developed.  Neck:     Comments: Anterior neck fullness Cardiovascular:     Rate and Rhythm:  Normal rate.     Heart sounds: Normal heart sounds. No murmur heard. Pulmonary:     Effort: Pulmonary effort is normal.     Breath sounds: Normal breath sounds. No wheezing or rales.  Chest:     Chest wall: No tenderness.  Abdominal:     General: Bowel sounds are normal. There is no distension.     Palpations: Abdomen is soft. There is no mass.     Tenderness: There is no abdominal tenderness.  Musculoskeletal:        General: Normal range of motion.     Right lower leg: No edema.     Left lower leg: No edema.  Neurological:     Mental Status:  She is alert and oriented to person, place, and time.  Psychiatric:        Mood and Affect: Mood normal.        Latest Ref Rng & Units 03/29/2023    2:06 PM 11/24/2022    4:06 PM 10/17/2022    4:45 PM  CMP  Glucose 70 - 99 mg/dL   77   BUN 6 - 20 mg/dL   9   Creatinine 5.28 - 1.00 mg/dL   4.13   Sodium 244 - 010 mmol/L   141   Potassium 3.5 - 5.2 mmol/L   3.6   Chloride 96 - 106 mmol/L   99   CO2 20 - 29 mmol/L   28   Calcium 8.7 - 10.2 mg/dL   9.3   Total Protein 6.1 - 8.1 g/dL 7.8  6.7  7.5   Total Bilirubin 0.2 - 1.2 mg/dL 0.5  <2.7  0.3   Alkaline Phos 44 - 121 IU/L  63  53   AST 10 - 30 U/L 11  11  13    ALT 6 - 29 U/L 20  14  20      Lipid Panel     Component Value Date/Time   CHOL 151 04/28/2021 0851   TRIG 81 04/28/2021 0851   HDL 36 (L) 04/28/2021 0851   CHOLHDL 4.2 04/28/2021 0851   LDLCALC 99 04/28/2021 0851    CBC    Component Value Date/Time   WBC 9.4 10/17/2022 1645   WBC 9.6 05/13/2012 1515   RBC 3.49 (L) 10/17/2022 1645   RBC 4.10 05/13/2012 1515   HGB 10.8 (L) 10/17/2022 1645   HCT 33.0 (L) 10/17/2022 1645   PLT 311 10/17/2022 1645   MCV 95 10/17/2022 1645   MCH 30.9 10/17/2022 1645   MCH 28.3 05/13/2012 1515   MCHC 32.7 10/17/2022 1645   MCHC 32.0 05/13/2012 1515   RDW 11.4 (L) 10/17/2022 1645   LYMPHSABS 4.2 (H) 10/17/2022 1645   MONOABS 0.7 05/13/2012 1515   EOSABS 0.3 10/17/2022 1645    BASOSABS 0.0 10/17/2022 1645    Lab Results  Component Value Date   HGBA1C 5.0 04/18/2023    Assessment & Plan:      Hypertension: Well controlled on hydrochlorothiazide 25mg . Patient planning for pregnancy, thus need to switch to a pregnancy-safe antihypertensive. -Discontinue hydrochlorothiazide. -Start Labetalol 200mg  twice daily. -Advise patient to monitor blood pressure at home and report if systolic <100 or >253, or diastolic <60 or >80. -Counseled on blood pressure goal of less than 130/80, low-sodium, DASH diet, medication compliance, 150 minutes of moderate intensity exercise per week. Discussed medication compliance, adverse effects.    Neck swelling: The patient also has a noticeable neck enlargement, which has been present since high school when she weighed over 400 pounds. She is currently taking multivitamins and exercises regularly.  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 Currently using ovulation sticks with negative results. No diagnosis of PCOS. Patient taking multivitamins. -Refer to Gynecology for fertility evaluation and discussion of possible Clomid use. -Order labs: A1C, thyroid function tests, liver and kidney function tests. -Order neck ultrasound to evaluate possible goiter.   Follow-up: Await lab results and ultrasound report. Patient to monitor blood pressure at home and report any abnormal readings.          Meds ordered this encounter  Medications   labetalol (NORMODYNE)  200 MG tablet    Sig: Take 1 tablet (200 mg total) by mouth 2 (two) times daily.    Dispense:  180 tablet    Refill:  1    Discontinue HCTZ    Follow-up: Return in about 6 months (around 10/19/2023) for Chronic medical conditions.       Hoy Register, MD, FAAFP. [facility] [place],  [place] [phone]   04/18/2023, 1:14 PM

## 2023-04-18 NOTE — Patient Instructions (Signed)
Preparing for Pregnancy If you are planning to become pregnant, talk to your health care provider about preconception care. This type of care helps you prepare for a safe and healthy pregnancy. During this visit, your health care provider will: Do a complete physical exam, including a vaginal exam. Take your complete medical history. Give you information, answer your questions, and address possible problems. What should be on my preconception checklist? Medical history Tell your health care provider about any medical conditions you have or have had. Your pregnancy or your ability to become pregnant may be affected by long-term (chronic) conditions, such as: Diabetes. High blood pressure (hypertension). Problems with your thyroid. Tell your health care provider about your family's medical history and your partner's medical history. If needed, discuss the benefits of genetic testing. This checks for conditions that may be passed from parent to child. Tell your health care provider if you have or have had any sexually transmitted infections (STIs). These can affect your pregnancy. In some cases, they can pass to your baby. Tell your health care provider about: Any problems you had with previous pregnancies. Any medicines you take. These include vitamins, herbs, supplements, and over-the-counter medicines. Your vaccine history. Discuss any vaccines that you may need. Diet Ask your health care provider what foods to eat to get a balance of nutrients. This is very important when you are pregnant or preparing to get pregnant. Take a folic acid supplement of 400 mcg daily and eat foods rich in folic acid to help prevent certain birth defects. You should also take a prenatal vitamin before pregnancy and keep taking it while pregnant. Ask your health care provider to help you reach a healthy weight before pregnancy. If you are overweight, you may have a higher risk for certain problems. These include  having trouble getting pregnant (infertility), hypertension, diabetes, and early (preterm) birth. If you are underweight, you are more likely to have a baby who has a low birth weight. Lifestyle, work, and home Let your health care provider know about: Use of alcohol, illegal drugs, or tobacco. These products can cause serious problems for your pregnancy and your baby. Fun and leisure activities that may put you at risk during pregnancy. These may include skiing and certain exercise programs. Any plans to travel out of the country, especially to places with an active outbreak of [place] virus. Harmful substances that you may be exposed to at work or home. These include chemicals, pesticides, radiation, and substances from cat litter boxes. Any concerns you have for your safety at home. Mental health Tell your health care provider about: Any history of mental health conditions, including feelings of depression, sadness, or anxiety. Any thoughts of suicide or previous attempts of suicide. Any medicines that you take for a mental health condition. These include herbs and supplements. How do I know that I am pregnant? You may be pregnant if you have been sexually active, and you miss your period. Other symptoms of early pregnancy may include: Mild cramping. Feeling more tired than usual. Nausea and vomiting. These may be signs of morning sickness. Breast enlargement and tenderness. Take a home pregnancy test if you have any of these symptoms. This test checks for a hormone in your urine called human chorionic gonadotropin, or hCG. Your body begins to make this hormone during early pregnancy. Wait until at least the first day after you miss your period to take a test. What should I do if I become pregnant? Schedule a visit with your  health care provider as soon as you suspect you are pregnant. Talk to your health care provider if you are taking medicines to see if they are safe to take during  pregnancy. Follow these instructions at home: Eating and drinking  Follow instructions from your health care provider about what you may eat and drink. Drink enough fluids to keep your urine pale yellow. Eat a balanced diet. This includes fresh fruits and vegetables, whole grains, lean meats, low-fat dairy products, healthy fats, and foods that are high in fiber. Wash all fresh fruits and vegetables before eating. Ask to meet with a nutritionist or registered dietitian for help with meal planning and goals. Avoid eating raw or undercooked meat and seafood. Avoid eating or drinking unpasteurized dairy products. Heat all processed foods to 165F. This includes precooked or cured meat, such as sausages or meat loaves. Lifestyle     Get regular exercise. Try to be active for at least 30 minutes a day on most days of the week. Ask your health care provider which activities are safe during pregnancy. Ask your health care provider about ways to manage stress. Maintain a healthy weight. Avoid toxic fumes and chemicals. Avoid cleaning cat litter boxes. Cat stool (feces) may contain a harmful parasite called toxoplasma. Avoid travel to countries where [place] virus is common. Do not use any products that contain nicotine or tobacco. These products include cigarettes, chewing tobacco, and vaping devices, such as e-cigarettes. If you need help quitting, ask your health care provider. Do not drink alcohol or use drugs. General instructions Keep an accurate record of your menstrual periods. This makes it easier for your health care provider to determine your baby's due date. Take over-the-counter and prescription medicines only as told by your health care provider. Manage any chronic conditions, such as hypertension and diabetes, as told by your health care provider. This information is not intended to replace advice given to you by your health care provider. Make sure you discuss any questions you have  with your health care provider. Document Revised: 01/26/2022 Document Reviewed: 01/26/2022 Elsevier Patient Education  2024 [employer].

## 2023-04-20 DIAGNOSIS — Z419 Encounter for procedure for purposes other than remedying health state, unspecified: Secondary | ICD-10-CM | POA: Diagnosis not present

## 2023-04-25 ENCOUNTER — Ambulatory Visit
Admission: RE | Admit: 2023-04-25 | Discharge: 2023-04-25 | Disposition: A | Discharge status: Home / Self Care | Payer: Medicaid Other | Source: Ambulatory Visit | Attending: Family Medicine | Admitting: Family Medicine

## 2023-04-25 DIAGNOSIS — R221 Localized swelling, mass and lump, neck: Secondary | ICD-10-CM

## 2023-04-25 DIAGNOSIS — E041 Nontoxic single thyroid nodule: Secondary | ICD-10-CM | POA: Diagnosis not present

## 2023-04-25 DIAGNOSIS — E042 Nontoxic multinodular goiter: Secondary | ICD-10-CM | POA: Diagnosis not present

## 2023-04-26 ENCOUNTER — Other Ambulatory Visit: Payer: Self-pay | Admitting: Family Medicine

## 2023-04-26 ENCOUNTER — Encounter: Payer: Self-pay | Admitting: Family Medicine

## 2023-04-26 DIAGNOSIS — E042 Nontoxic multinodular goiter: Secondary | ICD-10-CM

## 2023-04-27 ENCOUNTER — Other Ambulatory Visit: Payer: Self-pay | Admitting: Family Medicine

## 2023-04-27 DIAGNOSIS — E042 Nontoxic multinodular goiter: Secondary | ICD-10-CM

## 2023-05-05 ENCOUNTER — Other Ambulatory Visit: Payer: Self-pay | Admitting: Family Medicine

## 2023-05-18 ENCOUNTER — Ambulatory Visit
Admission: RE | Admit: 2023-05-18 | Discharge: 2023-05-18 | Disposition: A | Discharge status: Home / Self Care | Payer: Medicaid Other | Source: Ambulatory Visit | Attending: Family Medicine | Admitting: Family Medicine

## 2023-05-18 ENCOUNTER — Other Ambulatory Visit (HOSPITAL_COMMUNITY)
Admission: RE | Admit: 2023-05-18 | Discharge: 2023-05-18 | Disposition: A | Discharge status: Home / Self Care | Payer: Medicaid Other | Source: Ambulatory Visit | Attending: Family Medicine | Admitting: Family Medicine

## 2023-05-18 DIAGNOSIS — E041 Nontoxic single thyroid nodule: Secondary | ICD-10-CM

## 2023-05-18 DIAGNOSIS — E042 Nontoxic multinodular goiter: Secondary | ICD-10-CM

## 2023-05-21 DIAGNOSIS — Z419 Encounter for procedure for purposes other than remedying health state, unspecified: Secondary | ICD-10-CM | POA: Diagnosis not present

## 2023-05-23 ENCOUNTER — Encounter: Payer: Self-pay | Admitting: Family Medicine

## 2023-05-25 LAB — CYTOLOGY - NON PAP

## 2023-06-20 DIAGNOSIS — Z419 Encounter for procedure for purposes other than remedying health state, unspecified: Secondary | ICD-10-CM | POA: Diagnosis not present

## 2023-07-14 ENCOUNTER — Ambulatory Visit (INDEPENDENT_AMBULATORY_CARE_PROVIDER_SITE_OTHER): Payer: Medicaid Other | Admitting: Podiatry

## 2023-07-14 ENCOUNTER — Encounter: Payer: Self-pay | Admitting: Podiatry

## 2023-07-14 DIAGNOSIS — B351 Tinea unguium: Secondary | ICD-10-CM

## 2023-07-14 DIAGNOSIS — Z79899 Other long term (current) drug therapy: Secondary | ICD-10-CM

## 2023-07-14 NOTE — Progress Notes (Signed)
Subjective:  Patient ID: Ashlee Fox, female    DOB: 13-Aug-1986,  MRN: [id number]  Chief Complaint  Patient presents with   Nail Problem    RM#7 follow up on foot fungus patient states is getting better but not all gone.    37 y.o. female presents with the above complaint.  Patient presents for follow-up of bilateral hallux bilateral second digit nail fungus.  She states doing that she likely 1 mg of Lamisil.  Denies any other acute complaints  Review of Systems: Negative except as noted in the HPI. Denies N/V/F/Ch.  Past Medical History:  Diagnosis Date   Anemia    Complication of anesthesia    Hx MRSA infection    Hypertension    Morbid obesity (HCC) 10/27/2017    Current Outpatient Medications:    cetirizine (ZYRTEC) 10 MG tablet, Take 1 tablet (10 mg total) by mouth daily., Disp: 30 tablet, Rfl: 1   chlorhexidine (HIBICLENS) 4 % external liquid, Apply topically daily as needed., Disp: 120 mL, Rfl: 0   cholecalciferol (VITAMIN D) 1000 units tablet, Take 1 capsule by mouth daily., Disp: , Rfl:    doxycycline (VIBRA-TABS) 100 MG tablet, Take 1 tablet (100 mg total) by mouth 2 (two) times daily., Disp: 14 tablet, Rfl: 0   Ferrous Sulfate (IRON) 325 (65 Fe) MG TABS, Take 1 tablet by mouth twice daily, Disp: 180 tablet, Rfl: 1   fluticasone (FLONASE) 50 MCG/ACT nasal spray, Place 2 sprays into both nostrils daily., Disp: 16 g, Rfl: 1   labetalol (NORMODYNE) 200 MG tablet, Take 1 tablet (200 mg total) by mouth 2 (two) times daily., Disp: 180 tablet, Rfl: 1   metFORMIN (GLUCOPHAGE) 500 MG tablet, TAKE 1 TABLET BY MOUTH TWICE DAILY WITH A MEAL, Disp: 60 tablet, Rfl: 0   Multiple Vitamin (MULTIVITAMIN WITH MINERALS) TABS, Take 1 tablet by mouth daily., Disp: , Rfl:    naproxen (NAPROSYN) 500 MG tablet, TAKE 1 TABLET BY MOUTH TWICE DAILY WITH MEALS AS NEEDED FOR PAIN, Disp: 60 tablet, Rfl: 0   terbinafine (LAMISIL) 250 MG tablet, Take 1 tablet (250 mg total) by mouth daily., Disp: 90  tablet, Rfl: 0   terbinafine (LAMISIL) 250 MG tablet, Take 1 tablet (250 mg total) by mouth daily., Disp: 90 tablet, Rfl: 0  Social History   Tobacco Use  Smoking Status Never  Smokeless Tobacco Never    Allergies  Allergen Reactions   Amoxicillin Anaphylaxis   Pineapple Itching   Latex Rash   Objective:  There were no vitals filed for this visit. There is no height or weight on file to calculate BMI. Constitutional Well developed. Well nourished.  Vascular Dorsalis pedis pulses palpable bilaterally. Posterior tibial pulses palpable bilaterally. Capillary refill normal to all digits.  No cyanosis or clubbing noted. Pedal hair growth normal.  Neurologic Normal speech. Oriented to person, place, and time. Epicritic sensation to light touch grossly present bilaterally.  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   Radiographs: None Assessment:   1. Long-term use of high-risk medication    Plan:  Patient was evaluated and treated and all questions answered.  Bilateral hallux and second digit onychomycosis/nail fungus~third -Educated the patient on the etiology of onychomycosis and various treatment options associated with improving the fungal load.  I explained to the patient  that there is 3 treatment options available to treat the onychomycosis including topical, p.o., laser treatment.  Patient elected to undergo p.o. options with Lamisil/terbinafine therapy.  In order for me to start the medication therapy, I explained to the patient the importance of evaluating the liver and obtaining the liver function test.  Once the liver function test comes back normal I will start him on 110-month course of Lamisil therapy.  Patient understood all risk  and would like to proceed with Lamisil therapy.  I have asked the patient to immediately stop the Lamisil therapy if she has any reactions to it and call the office or go to the emergency room right away.  Patient states understanding  Pes planovalgus/foot deformity -I explained to patient the etiology of pes planovalgus and relationship with Planter fasciitis and various treatment options were discussed.  Given patient foot structure in the setting of Planter fasciitis I believe patient will benefit from custom-made orthotics to help control the hindfoot motion support the arch of the foot and take the stress away from plantar fascial.  Patient agrees with the plan like to proceed with orthotics -Patient was casted for orthotics    No follow-ups on file.

## 2023-07-21 DIAGNOSIS — Z419 Encounter for procedure for purposes other than remedying health state, unspecified: Secondary | ICD-10-CM | POA: Diagnosis not present

## 2023-07-27 DIAGNOSIS — B009 Herpesviral infection, unspecified: Secondary | ICD-10-CM | POA: Insufficient documentation

## 2023-07-27 NOTE — Progress Notes (Signed)
GYNECOLOGY OFFICE VISIT NOTE  History:   Ashlee Fox is a 37 y.o. G4P0040 here today for TTC.   Discussed the use of AI scribe software for clinical note transcription with the patient, who gave verbal consent to proceed.  History of Present Illness   The patient, with a history of recurrent miscarriages, presents for a fertility consultation. She has had four miscarriages in the past, the first one occurring when she was in college and the most recent one in 2018. The patient reports that each miscarriage was different and she has never had the same issue twice. She has been trying to conceive with her current partner for over a year without success. In an effort to improve her fertility, the patient has lost a significant amount of weight (160 pounds) and reports that her menstrual cycle has become regular since the weight loss. She is currently taking prenatal vitamins.      2004 - in college while on [facility] 2012 - got methotrexate, ? [place] 2014 - D&E 2018 - miso   Started TTC with current partner October 2023. He has 2 children from another relationship. Last was 4 years ago.     Past Medical History:  Diagnosis Date   Anemia    Complication of anesthesia    Hx MRSA infection    Hypertension    Morbid obesity (HCC) 10/27/2017    Past Surgical History:  Procedure Laterality Date   CARPAL TUNNEL RELEASE Right 12/15/2021   Procedure: RIGHT CARPAL TUNNEL RELEASE;  Surgeon: Marlyne Beards, MD;  Location: [facility];  Service: Orthopedics;  Laterality: Right;   TONSILLECTOMY     ULNAR NERVE TRANSPOSITION Right 12/15/2021   Procedure: RIGHT ULNAR NERVE DECOMPRESSION/TRANSPOSITION;  Surgeon: Marlyne Beards, MD;  Location: [facility];  Service: Orthopedics;  Laterality: Right;    The following portions of the patient's history were reviewed and updated as appropriate: allergies, current medications, past family history, past medical history, past  social history, past surgical history and problem list.   Health Maintenance:   Diagnosis  Date Value Ref Range Status  01/09/2023   Final   - Negative for Intraepithelial Lesions or Malignancy (NILM)  01/09/2023 - Benign reactive/reparative changes  Final   Review of Systems:  Pertinent items noted in HPI and remainder of comprehensive ROS otherwise negative.  Physical Exam:  BP (!) 173/123   Pulse 73   Ht 5\' 10"  (1.778 m)   Wt 287 lb 9.6 oz (130.5 kg)   LMP 07/22/2023 (Exact Date)   BMI 41.27 kg/m  CONSTITUTIONAL: Well-developed, well-nourished female in no acute distress.  HEENT:  Normocephalic, atraumatic. External right and left ear normal. No scleral icterus.  NECK: Normal range of motion, supple, no masses noted on observation SKIN: No rash noted. Not diaphoretic. No erythema. No pallor. MUSCULOSKELETAL: Normal range of motion. No edema noted. NEUROLOGIC: Alert and oriented to person, place, and time. Normal muscle tone coordination. No cranial nerve deficit noted. PSYCHIATRIC: Normal mood and affect. Normal behavior. Normal judgment and thought content.  PELVIC: Deferred  Labs and Imaging No results found for this or any previous visit (from the past 168 hour(s)). No results found.  Assessment and Plan:   1. Infertility counseling - Reviewed three main factors for fertility - ovulation, normal sperm and tubal patency - Ovulation: Check ovarian reserve with day 3 labs.  - Sperm: Slip given for semen analysis -  Would eventually need HSG due to history of chlamydia.  -  Information given for fertility specialists in the interim. Recommend SA through Dr. April Manson and follow up with him since 69w old and G3P0030.  - h/o RPL as well. Will check APS labs. Recent TSH and A1C wnl.     [name] was seen today for consult.  Diagnoses and all orders for this visit:  Infertility counseling - Reviewed three main factors for fertility - ovulation, normal sperm and tubal  patency - Ovulation: Check ovarian reserve with [facility]  - Sperm: Slip given for semen analysis -  Would eventually need HSG if all else is normal.  - Information given for fertility specialists in the interim. Recommend SA through Dr. 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 No common cause identified. Recent weight loss has led to regular menstrual cycles. -Order blood work for antiphospholipid antibody syndrome. -Refer to genetics for karyotype analysis. -Plan sonohysterogram to assess for uterine abnormalities. - Recent TSH and A1C normal.  -     Lupus anticoagulant panel( [facility]) -     Cardiolipin antibodies, IgM+IgG -     Beta-2 Glycoprotein I Ab,G/M -     [place] Sonohysterogram; Future (Msg sent to [facility] - pt willing to travel) -     AMB referral to maternal fetal medicine  No orders of the defined types were placed in this encounter.    Routine preventative health maintenance measures emphasized. Please refer to After Visit Summary for other counseling recommendations.   Return if symptoms worsen or fail to improve.  Milas Hock, MD, FACOG Obstetrician & Gynecologist, [facility] for [facility], [facility]

## 2023-08-03 ENCOUNTER — Ambulatory Visit (INDEPENDENT_AMBULATORY_CARE_PROVIDER_SITE_OTHER): Payer: Medicaid Other | Admitting: Obstetrics and Gynecology

## 2023-08-03 ENCOUNTER — Other Ambulatory Visit: Payer: Self-pay

## 2023-08-03 ENCOUNTER — Encounter: Payer: Self-pay | Admitting: Obstetrics and Gynecology

## 2023-08-03 VITALS — BP 172/111 | HR 73 | Ht 70.0 in | Wt 287.6 lb

## 2023-08-03 DIAGNOSIS — Z3169 Encounter for other general counseling and advice on procreation: Secondary | ICD-10-CM

## 2023-08-03 DIAGNOSIS — N96 Recurrent pregnancy loss: Secondary | ICD-10-CM | POA: Diagnosis not present

## 2023-08-03 NOTE — Patient Instructions (Addendum)
Dr. Jamse Arn recommends the following: Vitamin C 1000 mg daily, Vitamin E 400 mg daily, Zinc mg daily, Folic acid 400 mcg daily.    [facility] in [place]: [phone] [address] [place]

## 2023-08-12 LAB — LUPUS ANTICOAGULANT PANEL
Dilute Viper Venom Time: 26.6 s (ref 0.0–47.0)
PTT Lupus Anticoagulant: 30.3 s (ref 0.0–43.5)

## 2023-08-12 LAB — BETA-2 GLYCOPROTEIN I AB,G/M
Beta-2 Glyco 1 IgM: <9 GPI IgM units (ref 0–32)
Beta-2 Glyco I IgG: <9 GPI IgG units (ref 0–20)

## 2023-08-12 LAB — CARDIOLIPIN ANTIBODIES, IGM+IGG
Anticardiolipin IgG: <9 [GPL'U]/mL (ref 0–14)
Anticardiolipin IgM: 13 [MPL'U]/mL — ABNORMAL HIGH (ref 0–12)

## 2023-08-12 LAB — ANTI MULLERIAN HORMONE: ANTI-MULLERIAN HORMONE (AMH): 0.844 ng/mL

## 2023-08-20 DIAGNOSIS — Z419 Encounter for procedure for purposes other than remedying health state, unspecified: Secondary | ICD-10-CM | POA: Diagnosis not present

## 2023-08-25 ENCOUNTER — Encounter: Payer: Self-pay | Admitting: Obstetrics and Gynecology

## 2023-08-25 DIAGNOSIS — N96 Recurrent pregnancy loss: Secondary | ICD-10-CM

## 2023-08-28 ENCOUNTER — Ambulatory Visit: Payer: Medicaid Other

## 2023-08-28 ENCOUNTER — Other Ambulatory Visit: Payer: Self-pay | Admitting: Obstetrics & Gynecology

## 2023-08-28 ENCOUNTER — Ambulatory Visit (INDEPENDENT_AMBULATORY_CARE_PROVIDER_SITE_OTHER): Payer: Medicaid Other | Admitting: Obstetrics & Gynecology

## 2023-08-28 DIAGNOSIS — N96 Recurrent pregnancy loss: Secondary | ICD-10-CM | POA: Diagnosis not present

## 2023-08-28 DIAGNOSIS — Z3202 Encounter for pregnancy test, result negative: Secondary | ICD-10-CM

## 2023-08-28 LAB — POCT URINE PREGNANCY: Preg Test, Ur: NEGATIVE

## 2023-08-28 NOTE — Progress Notes (Signed)
PELVIC/SONOHYSTEROGRAM TA/TV:homogeneous anteverted uterus,WNL,EEC 8 mm,bilaterally enlarged ovaries, small myometrial cyst anterior mid uterus 5 x 3 mm,no free fluid,no pain during ultrasound  Sonohysterogram was preformed by Dr. Despina Hidden, with ultrasound guidance and surveillance throughout the procedure. The saline outlined the smooth symmetrical walls,no solitary masses noted within the endometrial cavity.   Chaperone Ferdinand Lango

## 2023-08-28 NOTE — Progress Notes (Unsigned)
Follow up appointment for results: [facility]  Chief Complaint  Patient presents with   Follow-up    sonohysterogram    Last menstrual period 07/22/2023.  No results found.  ***  MEDS ordered this encounter: No orders of the defined types were placed in this encounter.   Orders for this encounter: Orders Placed This Encounter  Procedures   POCT urine pregnancy    Impression + Management Plan   ICD-10-CM   1. Pregnancy test negative  Z32.02 POCT urine pregnancy      Follow Up: No follow-ups on file.     All questions were answered.  Past Medical History:  Diagnosis Date   Anemia    Complication of anesthesia    Hx MRSA infection    Hypertension    Morbid obesity (HCC) 10/27/2017    Past Surgical History:  Procedure Laterality Date   CARPAL TUNNEL RELEASE Right 12/15/2021   Procedure: RIGHT CARPAL TUNNEL RELEASE;  Surgeon: Marlyne Beards, MD;  Location: [facility];  Service: Orthopedics;  Laterality: Right;   TONSILLECTOMY     ULNAR NERVE TRANSPOSITION Right 12/15/2021   Procedure: RIGHT ULNAR NERVE DECOMPRESSION/TRANSPOSITION;  Surgeon: Marlyne Beards, MD;  Location: [facility];  Service: Orthopedics;  Laterality: Right;    OB History     Gravida  4   Para  0   Term  0   Preterm  0   AB  4   Living  0      SAB  4   IAB  0   Ectopic  0   Multiple  0   Live Births              Allergies  Allergen Reactions   Amoxicillin Anaphylaxis   Pineapple Itching   Latex Rash    Social History   Socioeconomic History   Marital status: Single    Spouse name: Not on file   Number of children: 0   Years of education: Not on file   Highest education level: Professional school degree (e.g., MD, DDS, DVM, JD)  Occupational History   Not on file  Tobacco Use   Smoking status: Never   Smokeless tobacco: Never  Vaping Use   Vaping status: Never Used  Substance and Sexual Activity   Alcohol use: No   Drug  use: Yes    Types: Marijuana    Comment: 'avid user'   Sexual activity: Yes    Birth control/protection: None  Other Topics Concern   Not on file  Social History Narrative   Not on file   Social Determinants of Health   Financial Resource Strain: Not on file  Food Insecurity: Not on file  Transportation Needs: No Transportation Needs (01/02/2020)   PRAPARE - Administrator, Civil Service (Medical): No    Lack of Transportation (Non-Medical): No  Physical Activity: Not on file  Stress: Not on file  Social Connections: Not on file    Family History  Problem Relation Age of Onset   Diabetes Maternal Grandmother    Hypertension Maternal Grandmother    Breast cancer Maternal Uncle    Diabetes Maternal Uncle

## 2023-09-20 DIAGNOSIS — Z419 Encounter for procedure for purposes other than remedying health state, unspecified: Secondary | ICD-10-CM | POA: Diagnosis not present

## 2023-10-19 ENCOUNTER — Ambulatory Visit: Payer: Medicaid Other | Attending: Family Medicine | Admitting: Family Medicine

## 2023-10-19 ENCOUNTER — Encounter: Payer: Self-pay | Admitting: Family Medicine

## 2023-10-19 VITALS — BP 177/106 | HR 79 | Ht 70.0 in | Wt 288.6 lb

## 2023-10-19 DIAGNOSIS — I10 Essential (primary) hypertension: Secondary | ICD-10-CM

## 2023-10-19 DIAGNOSIS — J329 Chronic sinusitis, unspecified: Secondary | ICD-10-CM | POA: Diagnosis not present

## 2023-10-19 DIAGNOSIS — N926 Irregular menstruation, unspecified: Secondary | ICD-10-CM

## 2023-10-19 DIAGNOSIS — L732 Hidradenitis suppurativa: Secondary | ICD-10-CM | POA: Diagnosis not present

## 2023-10-19 DIAGNOSIS — J029 Acute pharyngitis, unspecified: Secondary | ICD-10-CM | POA: Diagnosis not present

## 2023-10-19 LAB — POCT RAPID STREP A (OFFICE): Rapid Strep A Screen: NEGATIVE

## 2023-10-19 LAB — POCT URINE PREGNANCY: Preg Test, Ur: NEGATIVE

## 2023-10-19 LAB — POCT INFLUENZA A/B
Influenza A, POC: NEGATIVE
Influenza B, POC: NEGATIVE

## 2023-10-19 MED ORDER — LABETALOL HCL 200 MG PO TABS: 200.0000 mg | ORAL_TABLET | Freq: Two times a day (BID) | ORAL | 1 refills | Status: DC

## 2023-10-19 MED ORDER — DOXYCYCLINE HYCLATE 100 MG PO TABS
100.0000 mg | ORAL_TABLET | Freq: Two times a day (BID) | ORAL | 0 refills | Status: DC
Start: 2023-10-19 — End: 2024-02-08
  Filled 2023-10-20 (×2): qty 14, 7d supply, fill #0

## 2023-10-19 MED ORDER — PREDNISONE 20 MG PO TABS: 20.0000 mg | ORAL_TABLET | Freq: Every day | ORAL | 0 refills | Status: DC

## 2023-10-19 NOTE — Patient Instructions (Signed)
VISIT SUMMARY:  During today's visit, we discussed your recent sinus issues, elevated blood pressure, and general health maintenance. You reported symptoms such as chills, fever, headache, difficulty sleeping, throat constriction, ear discomfort, and a productive cough. We also reviewed your history of late menstrual periods and negative pregnancy tests, as well as your current elevated blood pressure.  YOUR PLAN:  -SINUSITIS: Sinusitis is an inflammation of the sinuses that can cause symptoms like headache, facial pressure, difficulty breathing, and a productive cough. We have prescribed Doxycycline to treat the infection and Prednisone 20mg  for 5 days to reduce inflammation.  -HYPERTENSION: Hypertension, or high blood pressure, was noted during your visit, likely due to your acute illness. Continue taking Labetalol as prescribed. We will schedule a follow-up appointment to reassess your blood pressure when you are not acutely ill. Your Labetalol prescription has been refilled.  -GENERAL HEALTH MAINTENANCE: Continue monitoring for pregnancy with home tests as you are trying to conceive. Your pregnancy test during the visit was negative.  INSTRUCTIONS:  Please take the prescribed medications as directed. Schedule a follow-up appointment to reassess your blood pressure when you are feeling better. Continue monitoring for pregnancy with home tests.

## 2023-10-19 NOTE — Progress Notes (Signed)
Subjective:  Patient ID: Ashlee Fox, female    DOB: [birth year]  Age: 38 y.o. MRN: [id number]  CC: Medical Management of Chronic Issues (Missed period/Sinus problems)   HPI Ashlee Fox is a 38 y.o. year old female with a history of hypertension, hidradenitis, PTSD.   Interval History: Discussed the use of AI scribe software for clinical note transcription with the patient, who gave verbal consent to proceed.  She presents with a 2-day history of sinus issues. She describes symptoms of chills, fever, headache, difficulty sleeping, and a sensation of throat constriction. She also reports ear discomfort, described as a sensation of clogging and unclogging. She has been experiencing productive cough and has tried Mucinex for symptom relief, but reports minimal improvement. She denies recent exposure to sick contacts.  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 She is currently awaiting genetic testing results.  Blood pressure is elevated today.  Of note at her last office visit her hydrochlorothiazide was switched to labetalol due to her intentions to attempt to conceive.   Past Medical History:  Diagnosis Date   Anemia    Complication of anesthesia    Hx MRSA infection    Hypertension    Morbid obesity (HCC) 10/27/2017    Past Surgical History:  Procedure Laterality Date   CARPAL TUNNEL RELEASE Right 12/15/2021   Procedure: RIGHT CARPAL TUNNEL RELEASE;  Surgeon: Marlyne Beards, MD;  Location: [facility];  Service: Orthopedics;  Laterality: Right;   TONSILLECTOMY     ULNAR NERVE TRANSPOSITION Right 12/15/2021   Procedure: RIGHT ULNAR NERVE DECOMPRESSION/TRANSPOSITION;  Surgeon: Marlyne Beards, MD;  Location: [facility];  Service: Orthopedics;  Laterality: Right;    Family History  Problem Relation Age of Onset    Diabetes Maternal Grandmother    Hypertension Maternal Grandmother    Breast cancer Maternal Uncle    Diabetes Maternal Uncle     Social History   Socioeconomic History   Marital status: Single    Spouse name: Not on file   Number of children: 0   Years of education: Not on file   Highest education level: Associate degree: occupational, Scientist, product/process development, or vocational program  Occupational History   Not on file  Tobacco Use   Smoking status: Never   Smokeless tobacco: Never  Vaping Use   Vaping status: Never Used  Substance and Sexual Activity   Alcohol use: No   Drug use: Yes    Types: Marijuana    Comment: 'avid user'   Sexual activity: Yes    Birth control/protection: None  Other Topics Concern   Not on file  Social History Narrative   Not on file   Social Drivers of Health   Financial Resource Strain: Patient Declined (10/18/2023)   Overall Financial Resource Strain (CARDIA)    Difficulty of Paying Living Expenses: Patient declined  Food Insecurity: Patient Declined (10/18/2023)   Hunger Vital Sign    Worried About Running Out of Food in the Last Year: Patient declined    Ran Out of Food in the Last Year: Patient declined  Transportation Needs: No Transportation Needs (10/18/2023)   PRAPARE - Administrator, Civil Service (Medical): No    Lack of Transportation (Non-Medical): No  Physical Activity: Unknown (10/18/2023)   Exercise Vital Sign    Days of Exercise per Week: Patient declined  Minutes of Exercise per Session: Not on file  Stress: Patient Declined (10/18/2023)   [employer] of Occupational Health - Occupational Stress Questionnaire    Feeling of Stress : Patient declined  Social Connections: Unknown (10/18/2023)   Social Connection and Isolation Panel [NHANES]    Frequency of Communication with Friends and Family: Patient declined    Frequency of Social Gatherings with Friends and Family: Patient declined    Attends Religious Services:  Patient declined    Database administrator or Organizations: No    Attends Engineer, structural: Not on file    Marital Status: Patient declined    Allergies  Allergen Reactions   Amoxicillin Anaphylaxis   Pineapple Itching   Latex Rash    Outpatient Medications Prior to Visit  Medication Sig Dispense Refill   cetirizine (ZYRTEC) 10 MG tablet Take 1 tablet (10 mg total) by mouth daily. 30 tablet 1   cholecalciferol (VITAMIN D) 1000 units tablet Take 1 capsule by mouth daily.     Ferrous Sulfate (IRON) 325 (65 Fe) MG TABS Take 1 tablet by mouth twice daily 180 tablet 1   fluticasone (FLONASE) 50 MCG/ACT nasal spray Place 2 sprays into both nostrils daily. 16 g 1   Multiple Vitamin (MULTIVITAMIN WITH MINERALS) TABS Take 1 tablet by mouth daily.     terbinafine (LAMISIL) 250 MG tablet Take 1 tablet (250 mg total) by mouth daily. 90 tablet 0   terbinafine (LAMISIL) 250 MG tablet Take 1 tablet (250 mg total) by mouth daily. 90 tablet 0   labetalol (NORMODYNE) 200 MG tablet Take 1 tablet (200 mg total) by mouth 2 (two) times daily. 180 tablet 1   chlorhexidine (HIBICLENS) 4 % external liquid Apply topically daily as needed. (Patient not taking: Reported on 10/19/2023) 120 mL 0   metFORMIN (GLUCOPHAGE) 500 MG tablet TAKE 1 TABLET BY MOUTH TWICE DAILY WITH A MEAL (Patient not taking: Reported on 10/19/2023) 60 tablet 0   naproxen (NAPROSYN) 500 MG tablet TAKE 1 TABLET BY MOUTH TWICE DAILY WITH MEALS AS NEEDED FOR PAIN (Patient not taking: Reported on 10/19/2023) 60 tablet 0   doxycycline (VIBRA-TABS) 100 MG tablet Take 1 tablet (100 mg total) by mouth 2 (two) times daily. (Patient not taking: Reported on 10/19/2023) 14 tablet 0   No facility-administered medications prior to visit.     ROS Review of Systems  Constitutional:  Positive for chills and fever. Negative for activity change and appetite change.  HENT:  Positive for postnasal drip and sore throat. Negative for sinus pressure.    Respiratory:  Positive for cough. Negative for chest tightness, shortness of breath and wheezing.   Cardiovascular:  Negative for chest pain and palpitations.  Gastrointestinal:  Negative for abdominal distention, abdominal pain and constipation.  Genitourinary: Negative.   Musculoskeletal: Negative.   Neurological:  Positive for headaches.  Psychiatric/Behavioral:  Negative for behavioral problems and dysphoric mood.     Objective:  BP (!) 177/106   Pulse 79   Ht 5\' 10"  (1.778 m)   Wt 288 lb 9.6 oz (130.9 kg)   SpO2 99%   BMI 41.41 kg/m      10/19/2023   12:19 PM 10/19/2023   11:23 AM 08/03/2023   10:44 AM  BP/Weight  Systolic BP 177 171 172  Diastolic BP 106 106 111  Wt. (Lbs)  288.6   BMI  41.41 kg/m2       Physical Exam Constitutional:      Appearance: She  is well-developed.  HENT:     Head:     Comments: Slight maxillary sinus tenderness on palpation    Right Ear: Tympanic membrane normal.     Left Ear: Tympanic membrane normal.     Mouth/Throat:     Pharynx: Posterior oropharyngeal erythema present.  Cardiovascular:     Rate and Rhythm: Normal rate.     Heart sounds: Normal heart sounds. No murmur heard. Pulmonary:     Effort: Pulmonary effort is normal.     Breath sounds: Normal breath sounds. No wheezing or rales.  Chest:     Chest wall: No tenderness.  Abdominal:     General: Bowel sounds are normal. There is no distension.     Palpations: Abdomen is soft. There is no mass.     Tenderness: There is no abdominal tenderness.  Musculoskeletal:        General: Normal range of motion.     Right lower leg: No edema.     Left lower leg: No edema.  Lymphadenopathy:     Cervical: Cervical adenopathy present.  Neurological:     Mental Status: She is alert and oriented to person, place, and time.  Psychiatric:        Mood and Affect: Mood normal.        Latest Ref Rng & Units 04/18/2023   10:47 AM 03/29/2023    2:06 PM 11/24/2022    4:06 PM  CMP   Glucose 70 - 99 mg/dL 86     BUN 6 - 20 mg/dL 6     Creatinine 1.61 - 1.00 mg/dL 0.96     Sodium 045 - 409 mmol/L 139     Potassium 3.5 - 5.2 mmol/L 3.6     Chloride 96 - 106 mmol/L 103     CO2 20 - 29 mmol/L 23     Calcium 8.7 - 10.2 mg/dL 8.9     Total Protein 6.0 - 8.5 g/dL 7.1  7.8  6.7   Total Bilirubin 0.0 - 1.2 mg/dL 0.4  0.5  <8.1   Alkaline Phos 44 - 121 IU/L 55   63   AST 0 - 40 IU/L 13  11  11    ALT 0 - 32 IU/L 19  20  14      Lipid Panel     Component Value Date/Time   CHOL 151 04/28/2021 0851   TRIG 81 04/28/2021 0851   HDL 36 (L) 04/28/2021 0851   CHOLHDL 4.2 04/28/2021 0851   LDLCALC 99 04/28/2021 0851    CBC    Component Value Date/Time   WBC 9.4 10/17/2022 1645   WBC 9.6 05/13/2012 1515   RBC 3.49 (L) 10/17/2022 1645   RBC 4.10 05/13/2012 1515   HGB 10.8 (L) 10/17/2022 1645   HCT 33.0 (L) 10/17/2022 1645   PLT 311 10/17/2022 1645   MCV 95 10/17/2022 1645   MCH 30.9 10/17/2022 1645   MCH 28.3 05/13/2012 1515   MCHC 32.7 10/17/2022 1645   MCHC 32.0 05/13/2012 1515   RDW 11.4 (L) 10/17/2022 1645   LYMPHSABS 4.2 (H) 10/17/2022 1645   MONOABS 0.7 05/13/2012 1515   EOSABS 0.3 10/17/2022 1645   BASOSABS 0.0 10/17/2022 1645    Lab Results  Component Value Date   HGBA1C 5.0 04/18/2023    Assessment & Plan:      Sinusitis Symptoms of headache, facial pressure, difficulty breathing, and productive cough for 2 days. Physical exam reveals swollen tonsils and lymph nodes. Negative  rapid flu and strep tests. Pending RSV and COVID tests. -Prescribe Doxycycline for sinusitis. -Prescribe Prednisone 20mg  for 5 days to reduce inflammation.  Hypertension Elevated blood pressure during visit, possibly due to acute illness and lack of sleep. Patient is currently on Labetalol. -Continue Labetalol. -Schedule follow-up appointment to reassess blood pressure when not acutely ill. -Refill Labetalol prescription.  General Health Maintenance -Continue monitoring  for pregnancy with home tests as patient is trying to conceive. Negative pregnancy test during visit.          Meds ordered this encounter  Medications   predniSONE (DELTASONE) 20 MG tablet    Sig: Take 1 tablet (20 mg total) by mouth daily with breakfast.    Dispense:  5 tablet    Refill:  0   labetalol (NORMODYNE) 200 MG tablet    Sig: Take 1 tablet (200 mg total) by mouth 2 (two) times daily.    Dispense:  180 tablet    Refill:  1   doxycycline (VIBRA-TABS) 100 MG tablet    Sig: Take 1 tablet (100 mg total) by mouth 2 (two) times daily.    Dispense:  14 tablet    Refill:  0    Follow-up: Return in about 1 month (around 11/17/2023) for Blood Pressure follow-up with PCP.       Hoy Register, MD, FAAFP. [facility] [place], [place] [phone]   10/19/2023, 12:59 PM

## 2023-10-20 ENCOUNTER — Telehealth: Payer: Self-pay

## 2023-10-20 ENCOUNTER — Other Ambulatory Visit (HOSPITAL_COMMUNITY): Payer: Self-pay

## 2023-10-20 ENCOUNTER — Other Ambulatory Visit: Payer: Self-pay

## 2023-10-20 MED ORDER — PREDNISONE 20 MG PO TABS
20.0000 mg | ORAL_TABLET | Freq: Every day | ORAL | 0 refills | Status: DC
  Filled 2023-10-20: qty 5, 5d supply, fill #0

## 2023-10-20 MED ORDER — LABETALOL HCL 200 MG PO TABS
200.0000 mg | ORAL_TABLET | Freq: Two times a day (BID) | ORAL | 1 refills | Status: DC
  Filled 2023-10-20: qty 60, 30d supply, fill #0
  Filled 2023-10-20 (×2): qty 180, 90d supply, fill #0
  Filled 2023-11-14: qty 60, 30d supply, fill #1

## 2023-10-20 MED ORDER — PREDNISONE 20 MG PO TABS: 20.0000 mg | ORAL_TABLET | Freq: Every day | ORAL | 0 refills | Status: DC

## 2023-10-20 NOTE — Telephone Encounter (Signed)
Medications has been sent to [facility] and patient has been informed.    Copied from CRM [id number] [id number]. Topic: General - Other >> Oct 20, 2023  4:11 PM Phill Myron wrote: Patient called stating that [facility] no longer take [facility] insurance. Patient will call back with a pharmacy that will take her insurance. So she will need her meds transferred to that pharmacy. She also stated she is willing to come to the community pharmacy.

## 2023-10-21 DIAGNOSIS — Z419 Encounter for procedure for purposes other than remedying health state, unspecified: Secondary | ICD-10-CM | POA: Diagnosis not present

## 2023-10-21 LAB — COVID-19, FLU A+B AND RSV
Influenza A, NAA: NOT DETECTED
Influenza B, NAA: NOT DETECTED
RSV, NAA: NOT DETECTED
SARS-CoV-2, NAA: NOT DETECTED

## 2023-10-23 ENCOUNTER — Encounter: Payer: Self-pay | Admitting: Family Medicine

## 2023-10-26 ENCOUNTER — Other Ambulatory Visit: Payer: Self-pay | Admitting: Family Medicine

## 2023-10-26 NOTE — Telephone Encounter (Signed)
 Requested by interface surescripts. Last labs 10/17/22. Future visit in 3 weeks.  Requested Prescriptions  Pending Prescriptions Disp Refills   Ferrous Sulfate  (IRON ) 325 (65 Fe) MG TABS [Pharmacy Med Name: Iron  325 (65 Fe) MG Oral Tablet] 180 tablet 0    Sig: Take 1 tablet by mouth twice daily     Endocrinology:  Minerals - Iron  Supplementation Failed - 10/26/2023  1:52 PM      Failed - HGB in normal range and within 360 days    Hemoglobin  Date Value Ref Range Status  10/17/2022 10.8 (L) 11.1 - 15.9 g/dL Final         Failed - HCT in normal range and within 360 days    Hematocrit  Date Value Ref Range Status  10/17/2022 33.0 (L) 34.0 - 46.6 % Final         Failed - RBC in normal range and within 360 days    RBC  Date Value Ref Range Status  10/17/2022 3.49 (L) 3.77 - 5.28 x10E6/uL Final  05/13/2012 4.10 3.87 - 5.11 MIL/uL Final         Failed - Fe (serum) in normal range and within 360 days    No results found for: IRON , IRONPCTSAT       Failed - Ferritin in normal range and within 360 days    No results found for: FERRITIN       Passed - Valid encounter within last 12 months    Recent Outpatient Visits           1 week ago Missed period   [facility] [place] [facility] [place]. [facility] Delbert Clam, MD   6 months ago Essential hypertension   [place] [facility] [place]. [facility] Delbert Clam, MD   9 months ago Screening for cervical cancer   [place] [facility] [place] [facility] [place]. [facility] Delbert Clam, MD   1 year ago Chronic pain of left knee   [place] [facility] [place] [facility] Of [place]. [facility] Delbert Clam, MD   2 years ago Pain in both hands   [place] [facility] [place] [facility] Of [place]. [facility] Delbert Clam, MD       Future Appointments             In 3 weeks Delbert Clam, MD [facility] Of [place]. [facility]

## 2023-11-01 ENCOUNTER — Ambulatory Visit: Payer: Medicaid Other

## 2023-11-01 ENCOUNTER — Ambulatory Visit: Payer: Medicaid Other | Attending: Obstetrics and Gynecology

## 2023-11-01 ENCOUNTER — Other Ambulatory Visit: Payer: Self-pay

## 2023-11-01 DIAGNOSIS — N96 Recurrent pregnancy loss: Secondary | ICD-10-CM

## 2023-11-01 DIAGNOSIS — Z31430 Encounter of female for testing for genetic disease carrier status for procreative management: Secondary | ICD-10-CM

## 2023-11-01 NOTE — Progress Notes (Signed)
[facility] at [facility] [address], [address] Phone:  [phone]   Fax:  [phone]      In-Person Genetic Counseling Clinic Note:   I spoke with 38 y.o. Ashlee Fox today to discuss recurrent pregnancy loss. She was referred by Milas Hock, MD.   Pregnancy History:    [id number].  Ashlee Fox has had four pregnancy losses with different partners. She and her current partner have been trying to conceive for around 1.5 years and has reported only "chemical pregnancies." She reports her previous losses were all due to different reasons, one of which due to MRSA and physical trauma. The others were of unknown etiology. Ashlee Fox has met with Dr. Milas Hock for infertility workup.   Family History:    A three-generation pedigree was created and scanned into Epic under the Media tab.  Ashlee Fox reports that her mother had a miscarriage, and the fetus had an anomaly identified on ultrasound. We discussed that fetal anomalies can be genetic or multifactorial.   Ashlee Fox reports that her partner's 87 yo has autism. He is now in the process of further evaluation and testing. She reports that this may be due to maternal exposures prenatally. Most cases of autism have an unknown etiology. Having an affected family member may increase the chance that the couple's future children will have autism; however, without genetic testing performed on affected family members, it is difficult to assess risk to the pregnancy and other family members. If there is a genetic cause, recurrence risk can be up to 50%.  Ashlee Fox reports that her partner's parents had a stillborn female and two sons who died from SIDS. No other information is known. We discussed that there could be a genetic cause to the SIDS and stillbirth; however, without additional information, recurrence risk is difficult to estimate.  Ashlee Fox shared that her maternal grandfather (d. 10 yo) had a rare form of stomach  cancer. His brother (d. 56s) had breast cancer for almost 20 years. His sister had an unknown cancer. Different types of cancer can have a hereditary component that increases an individual's susceptibility to develop cancer; however, most cancers occur by chance due to a combination of genetics and the environment. It is recommended Ashlee Fox inform her PCP about her family history so they can discuss appropriate screening and testing options, including a possible referral to cancer genetic counseling. Ashlee Fox declined a referral to cancer genetic counseling at this time.  If she learns of additional information, she is encouraged to reach out to reassess recurrence risk.  Maternal ethnicity reported as Black and paternal ethnicity reported as Black. Denies Ashkenazi Jewish ancestry.  Family history not remarkable for consanguinity, individuals with birth defects, intellectual disability, autism spectrum disorder, multiple spontaneous abortions, still births, or unexplained neonatal death.   Recurrent Pregnancy Loss:  Recurrent pregnancy loss (RPL) is defined as two or more clinically recognized pregnancy losses occurring prior to [redacted] weeks gestation. Fewer than 5% of couples have two miscarriages in a row, and only 1% will experience three or more. Around 50% of individuals experiencing RPL will have an identifiable etiology to their history. Pregnancy losses occur for many reasons, including endocrine factors (i.e. uncontrolled diabetes mellitus, luteal phase deficiency, thyroid disease, or polycystic ovarian syndrome), environmental factors, immunologic causes (i.e. antiphospholipid syndrome), maternal factors (i.e. uterine anatomic malformations, cervical abnormalities), as well as chromosomal and single-gene disorders. The overall rate of recognized pregnancy loss is approximately 20%, with the  majority occurring in the first trimester. After 28 weeks of pregnancy, the incidence is about 3-6% and attributed  causes include placental problems (i.e. abruption, infarction), maternal causes, and congenital malformations.   Chromosomal causes account for about 50%-70% of first trimester losses but are less commonly cited as a cause for late gestational pregnancy loss. Most chromosome differences are a result of a random change in the sperm or egg that conceived the fetus; however, 2-5% of all couples with RPL will be identified with a balanced chromosome translocation. A translocation occurs when there is an exchange in chromosome material, such as when a segment of one chromosome breaks off and reattaches to a different chromosome. When the translocation is balanced, there is no extra or missing genetic material; however, an individual who carries a balanced translocation has an increased risk to pass unbalanced chromosomes to their offspring. In couples in which one partner is a carrier of a balanced translocation, this can result in an increased risk for infertility, RPL, and offspring with abnormal chromosomes. Chromosome translocations can be detected by karyotype.  Single gene conditions can also be associated with RPL, but are less likely causes of early pregnancy loss compared with chromosomal differences. Single gene conditions can be more likely associated with later gestation losses. Certain autosomal recessive and X-linked conditions can be lethal in fetuses. If each member of a couple is a carrier for an autosomal recessive condition, there is a 25% chance the fetus would be affected with the condition. Certain X-linked conditions may be lethal in female fetuses. These can be inherited from unaffected or slightly affected mothers. Carrier screening is a genetic test that informs whether an individual is a carrier of certain autosomal recessive or X-linked conditions. Carrier screening panels may be limited or may be more expanded to include a wide range of autosomal recessive and X-linked genetic conditions.  There are over 500 genes that can be tested for on a carrier screening panel. Some conditions may be severe and actionable, whereas other conditions may not yet be well-understood or may not have treatment options available. In the event that one partner were found to be a carrier for one or more conditions, carrier screening would be available to the partner for those conditions. A negative result on carrier screening reduces the likelihood of being a carrier, however, does not entirely rule out the possibility. A negative result does not eliminate the chance for the couple's child to be affected with a genetic condition, as carrier screening does not evaluate all possible genetic conditions or all possible pathogenic variants for the genes included on the panel.  Due to this couple's history of RPL, chromosome analysis by karyotyping was offered to both, and an expanded carrier screen involving 613 genetic conditions was offered to [place]. This testing may help to inform the cause of the previous pregnancy losses, and may help the couple make informed reproductive decisions moving forward. Shaely elected karyotype and expanded carrier screening. Her partner was not present during the appointment.  Advanced Maternal Age:  We briefly discussed that the chance that a fetus would be affected with a chromosome difference increases with advanced maternal age. The chance that a future pregnancy would be affected with a chromosome difference based on her age alone (39 at delivery) is at least 1 in 29 (~1.2%).   Plan of Care:   Karyotype and expanded carrier screening was drawn for [facility]. We will discuss arranging a karyotype blood draw for her partner.   Informed  consent was obtained. All questions were answered.   90 minutes were spent on the date of the encounter in service to the patient including preparation, face-to-face consultation, discussion of test reports and available next steps, pedigree  construction, genetic risk assessment, documentation, and care coordination.    Thank you for sharing in the care of [place] with [place].  Please do not hesitate to contact us at [phone] if you have any questions.   Sheppard Plumber, MS Genetic Counselor   Genetic counseling student involved in appointment: No.

## 2023-11-01 NOTE — Addendum Note (Signed)
Addended by: Carolanne Grumbling on: 11/01/2023 01:41 PM   Modules accepted: Orders

## 2023-11-07 ENCOUNTER — Ambulatory Visit: Payer: Self-pay

## 2023-11-14 LAB — CHROMOSOME, BLOOD, ROUTINE
Cells Analyzed: 20
Cells Counted: 20
Cells Karyotyped: 2
GTG Band Resolution Achieved: 500

## 2023-11-15 ENCOUNTER — Other Ambulatory Visit: Payer: Self-pay

## 2023-11-15 LAB — HORIZON CUSTOM: REPORT SUMMARY: POSITIVE — AB

## 2023-11-16 ENCOUNTER — Telehealth (HOSPITAL_BASED_OUTPATIENT_CLINIC_OR_DEPARTMENT_OTHER): Payer: Self-pay

## 2023-11-16 DIAGNOSIS — Z148 Genetic carrier of other disease: Secondary | ICD-10-CM | POA: Diagnosis not present

## 2023-11-16 DIAGNOSIS — N96 Recurrent pregnancy loss: Secondary | ICD-10-CM | POA: Diagnosis not present

## 2023-11-16 NOTE — Telephone Encounter (Cosign Needed)
 I spoke with Ashlee Fox to return her karyotype and expanded carrier screening results re: recurrent pregnancy loss.  Karyotype: Her karyotype returned as normal female 37, XX. Therefore, the lab did not identify any changes or balanced translocations that would cause recurrent pregnancy loss.  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   This is an X-linked condition, and carrier females are typically not affected. We discussed that she has a 50% chance of passing down the pathogenic variant to each future pregnancy, and each female pregnancy will have a 50% chance of being affected.  G6PD deficiency is an X-linked condition that mainly affects males. The condition causes hemolysis which leads to anemia that can be triggered by infections, specific medications, moth balls, or fava beans. Symptoms vary, and some individuals do not exhibit symptoms. This condition is not known to be associated with recurrent pregnancy loss.  Of note, she was not found to be a carrier for the other 612 conditions screened for. This significantly reduces but does not eliminate the chance of being a carrier.  Plan of Care: We discussed that no further carrier screening is indicated for her partner unless he has an undisclosed, significant family history of a genetic condition. We also discussed that he does have the option of having a karyotype performed; however, given he has had two healthy children with a previous relationship and no history of recurrent miscarriages with Alcario Drought, he has a lower chance of being a balanced translocation carrier; however, this cannot be ruled out without diagnostic testing. She shared that she will discuss this with her partner, and they will reach out if they wish to proceed with karyotyping his blood.  25  minutes were spent on the date of the telephone encounter in service to the patient including preparation, discussion of test reports and available next steps, genetic risk assessment, and documentation.  Sheppard Plumber, MS, [facility] Certified Genetic Counselor [facility] [phone]

## 2023-11-18 DIAGNOSIS — Z419 Encounter for procedure for purposes other than remedying health state, unspecified: Secondary | ICD-10-CM | POA: Diagnosis not present

## 2023-11-21 ENCOUNTER — Ambulatory Visit: Payer: Medicaid Other | Attending: Family Medicine | Admitting: Family Medicine

## 2023-11-21 ENCOUNTER — Encounter: Payer: Self-pay | Admitting: Family Medicine

## 2023-11-21 ENCOUNTER — Other Ambulatory Visit: Payer: Self-pay

## 2023-11-21 VITALS — BP 183/104 | HR 74 | Ht 70.0 in | Wt 294.6 lb

## 2023-11-21 DIAGNOSIS — I10 Essential (primary) hypertension: Secondary | ICD-10-CM | POA: Diagnosis not present

## 2023-11-21 MED ORDER — LABETALOL HCL 400 MG PO TABS
400.0000 mg | ORAL_TABLET | Freq: Two times a day (BID) | ORAL | 1 refills | Status: DC
  Filled 2023-11-21: qty 180, 90d supply, fill #0

## 2023-11-21 NOTE — Progress Notes (Signed)
 Subjective:  Patient ID: Ashlee Fox, female    DOB: 21-Jan-1986  Age: 38 y.o. MRN: [id number]  CC: Hypertension     Discussed the use of AI scribe software for clinical note transcription with the patient, who gave verbal consent to proceed.  History of Present Illness The patient, with a history of hypertension, presents with persistently elevated blood pressure despite taking labetalol 200mg  twice daily. She reports experiencing dizzy spells and palpitations. She has been monitoring her blood pressure at home before and after taking her medication and has noticed that it remains high. She denies any changes in her diet, specifically denying an increase in sodium intake.  In addition to her hypertension, the patient is also considering pregnancy and has been seeing a gynecologist for recurrent miscarriages. She recently underwent genetic testing and a uterine flush, the results of which she is yet to discuss with her gynecologist. 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    Past Medical History:  Diagnosis Date   Anemia    Complication of anesthesia    Hx MRSA infection    Hypertension    Morbid obesity (HCC) 10/27/2017    Past Surgical History:  Procedure Laterality Date   CARPAL TUNNEL RELEASE Right 12/15/2021   Procedure: RIGHT CARPAL TUNNEL RELEASE;  Surgeon: Marlyne Beards, MD;  Location: [facility];  Service: Orthopedics;  Laterality: Right;   TONSILLECTOMY     ULNAR NERVE TRANSPOSITION Right 12/15/2021   Procedure: RIGHT ULNAR NERVE DECOMPRESSION/TRANSPOSITION;  Surgeon: Marlyne Beards, MD;  Location: [facility];  Service: Orthopedics;  Laterality: Right;    Family History  Problem Relation Age of Onset   Diabetes Maternal Grandmother    Hypertension Maternal Grandmother    Breast cancer Maternal Uncle    Diabetes  Maternal Uncle     Social History   Socioeconomic History   Marital status: Single    Spouse name: Not on file   Number of children: 0   Years of education: Not on file   Highest education level: Associate degree: occupational, Scientist, product/process development, or vocational program  Occupational History   Not on file  Tobacco Use   Smoking status: Never   Smokeless tobacco: Never  Vaping Use   Vaping status: Never Used  Substance and Sexual Activity   Alcohol use: No   Drug use: Yes    Types: Marijuana    Comment: 'avid user'   Sexual activity: Yes    Birth control/protection: None  Other Topics Concern   Not on file  Social History Narrative   Not on file   Social Drivers of Health   Financial Resource Strain: Patient Declined (10/18/2023)   Overall Financial Resource Strain (CARDIA)    Difficulty of Paying Living Expenses: Patient declined  Food Insecurity: Patient Declined (10/18/2023)   Hunger Vital Sign    Worried About Running Out of Food in the Last Year: Patient declined    Ran Out of Food in the Last Year: Patient declined  Transportation Needs: No Transportation Needs (10/18/2023)   PRAPARE - Administrator, Civil Service (Medical): No    Lack of Transportation (Non-Medical): No  Physical Activity: Unknown (10/18/2023)   Exercise Vital Sign    Days of Exercise per Week: Patient declined    Minutes of Exercise per Session: Not on file  Stress: Patient Declined (10/18/2023)   [place]  [employer] - Occupational Stress Questionnaire    Feeling of Stress : Patient declined  Social Connections: Unknown (10/18/2023)   Social Connection and Isolation Panel [NHANES]    Frequency of Communication with Friends and Family: Patient declined    Frequency of Social Gatherings with Friends and Family: Patient declined    Attends Religious Services: Patient declined    Database administrator or Organizations: No    Attends Engineer, structural: Not on  file    Marital Status: Patient declined    Allergies  Allergen Reactions   Amoxicillin Anaphylaxis   Pineapple Itching   Latex Rash    Outpatient Medications Prior to Visit  Medication Sig Dispense Refill   cetirizine (ZYRTEC) 10 MG tablet Take 1 tablet (10 mg total) by mouth daily. 30 tablet 1   cholecalciferol (VITAMIN D) 1000 units tablet Take 1 capsule by mouth daily.     doxycycline (VIBRA-TABS) 100 MG tablet Take 1 tablet (100 mg total) by mouth 2 (two) times daily. 14 tablet 0   Ferrous Sulfate (IRON) 325 (65 Fe) MG TABS Take 1 tablet by mouth twice daily 180 tablet 0   fluticasone (FLONASE) 50 MCG/ACT nasal spray Place 2 sprays into both nostrils daily. 16 g 1   Multiple Vitamin (MULTIVITAMIN WITH MINERALS) TABS Take 1 tablet by mouth daily.     predniSONE (DELTASONE) 20 MG tablet Take 1 tablet (20 mg total) by mouth daily with breakfast. 5 tablet 0   terbinafine (LAMISIL) 250 MG tablet Take 1 tablet (250 mg total) by mouth daily. 90 tablet 0   terbinafine (LAMISIL) 250 MG tablet Take 1 tablet (250 mg total) by mouth daily. 90 tablet 0   labetalol (NORMODYNE) 200 MG tablet Take 1 tablet (200 mg total) by mouth 2 (two) times daily. 180 tablet 1   chlorhexidine (HIBICLENS) 4 % external liquid Apply topically daily as needed. (Patient not taking: Reported on 11/21/2023) 120 mL 0   metFORMIN (GLUCOPHAGE) 500 MG tablet TAKE 1 TABLET BY MOUTH TWICE DAILY WITH A MEAL (Patient not taking: Reported on 08/03/2023) 60 tablet 0   naproxen (NAPROSYN) 500 MG tablet TAKE 1 TABLET BY MOUTH TWICE DAILY WITH MEALS AS NEEDED FOR PAIN (Patient not taking: Reported on 11/21/2023) 60 tablet 0   No facility-administered medications prior to visit.     ROS Review of Systems  Constitutional:  Negative for activity change, appetite change and fatigue.  HENT:  Negative for congestion, sinus pressure and sore throat.   Eyes:  Negative for visual disturbance.  Respiratory:  Negative for cough, chest  tightness, shortness of breath and wheezing.   Cardiovascular:  Negative for chest pain and palpitations.  Gastrointestinal:  Negative for abdominal distention, abdominal pain and constipation.  Endocrine: Negative for polydipsia.  Genitourinary:  Negative for dysuria and frequency.  Musculoskeletal:  Negative for arthralgias and back pain.  Skin:  Negative for rash.  Neurological:  Positive for dizziness. Negative for tremors, light-headedness and numbness.  Hematological:  Does not bruise/bleed easily.  Psychiatric/Behavioral:  Negative for agitation and behavioral problems.     Objective:  BP (!) 183/104   Pulse 74   Ht 5\' 10"  (1.778 m)   Wt 294 lb 9.6 oz (133.6 kg)   SpO2 93%   BMI 42.27 kg/m      11/21/2023    9:31 AM 10/19/2023   12:19 PM 10/19/2023   11:23 AM  BP/Weight  Systolic BP 183 177 171  Diastolic BP  104 106 106  Wt. (Lbs) 294.6  288.6  BMI 42.27 kg/m2  41.41 kg/m2      Physical Exam Constitutional:      Appearance: She is well-developed. She is obese.  Cardiovascular:     Rate and Rhythm: Normal rate.     Heart sounds: Normal heart sounds. No murmur heard. Pulmonary:     Effort: Pulmonary effort is normal.     Breath sounds: Normal breath sounds. No wheezing or rales.  Chest:     Chest wall: No tenderness.  Abdominal:     General: Bowel sounds are normal. There is no distension.     Palpations: Abdomen is soft. There is no mass.     Tenderness: There is no abdominal tenderness.  Musculoskeletal:        General: Normal range of motion.     Right lower leg: No edema.     Left lower leg: No edema.  Neurological:     Mental Status: She is alert and oriented to person, place, and time.  Psychiatric:        Mood and Affect: Mood normal.        Latest Ref Rng & Units 04/18/2023   10:47 AM 03/29/2023    2:06 PM 11/24/2022    4:06 PM  CMP  Glucose 70 - 99 mg/dL 86     BUN 6 - 20 mg/dL 6     Creatinine 1.61 - 1.00 mg/dL 0.96     Sodium 045 - 409  mmol/L 139     Potassium 3.5 - 5.2 mmol/L 3.6     Chloride 96 - 106 mmol/L 103     CO2 20 - 29 mmol/L 23     Calcium 8.7 - 10.2 mg/dL 8.9     Total Protein 6.0 - 8.5 g/dL 7.1  7.8  6.7   Total Bilirubin 0.0 - 1.2 mg/dL 0.4  0.5  <8.1   Alkaline Phos 44 - 121 IU/L 55   63   AST 0 - 40 IU/L 13  11  11    ALT 0 - 32 IU/L 19  20  14      Lipid Panel     Component Value Date/Time   CHOL 151 04/28/2021 0851   TRIG 81 04/28/2021 0851   HDL 36 (L) 04/28/2021 0851   CHOLHDL 4.2 04/28/2021 0851   LDLCALC 99 04/28/2021 0851    CBC    Component Value Date/Time   WBC 9.4 10/17/2022 1645   WBC 9.6 05/13/2012 1515   RBC 3.49 (L) 10/17/2022 1645   RBC 4.10 05/13/2012 1515   HGB 10.8 (L) 10/17/2022 1645   HCT 33.0 (L) 10/17/2022 1645   PLT 311 10/17/2022 1645   MCV 95 10/17/2022 1645   MCH 30.9 10/17/2022 1645   MCH 28.3 05/13/2012 1515   MCHC 32.7 10/17/2022 1645   MCHC 32.0 05/13/2012 1515   RDW 11.4 (L) 10/17/2022 1645   LYMPHSABS 4.2 (H) 10/17/2022 1645   MONOABS 0.7 05/13/2012 1515   EOSABS 0.3 10/17/2022 1645   BASOSABS 0.0 10/17/2022 1645    Lab Results  Component Value Date   HGBA1C 5.0 04/18/2023       Assessment & Plan Hypertension Blood pressure remains elevated on labetalol 200 mg twice daily. Considering pregnancy, labetalol is safe. Current heart rate of 74 bpm allows for dose increase without bradycardia risk. - Increase labetalol to 400 mg twice daily. - Advise home blood pressure monitoring and report if consistently above 130/80 mmHg. -  Instruct to send a MyChart message if blood pressure remains elevated after one week on the new dose for potential further increase to 600 mg twice daily. - Provide dietary advice to reduce sodium intake, including choosing fresh or frozen vegetables over canned, and selecting low sodium snacks. - Schedule follow-up appointment in six weeks.   Expressed concern about a note in her medical record indicating herpes simplex  diagnosis, which she was unaware of. Unable to locate specific test results or notes confirming this diagnosis during the visit. - Recommend follow-up with gynecologist to discuss herpes simplex diagnosis and review any relevant test results.      Meds ordered this encounter  Medications   labetalol 400 MG TABS    Sig: Take 400 mg by mouth 2 (two) times daily.    Dispense:  180 tablet    Refill:  1    Dose increase    Follow-up: Return in about 6 weeks (around 01/02/2024) for Blood Pressure follow-up with PCP.       Hoy Register, MD, FAAFP. [facility] [place], [place] [phone]   11/21/2023, 9:57 AM

## 2023-11-21 NOTE — Patient Instructions (Signed)
 VISIT SUMMARY:  During today's visit, we discussed your persistently elevated blood pressure and your concerns about a potential herpes simplex diagnosis. We also reviewed your current medications and considered your plans for pregnancy.  YOUR PLAN:  -HYPERTENSION: Hypertension means high blood pressure. Your blood pressure remains high despite taking labetalol 200 mg twice daily. We will increase your dose to 400 mg twice daily. Please monitor your blood pressure at home and report if it stays above 130/80 mmHg. If it remains high after one week on the new dose, send a MyChart message for a possible further increase to 600 mg twice daily. Additionally, reduce your sodium intake by choosing fresh or frozen vegetables over canned ones and selecting low sodium snacks. We will have a follow-up appointment in six weeks.   INSTRUCTIONS:  Please monitor your blood pressure at home and report if it stays above 130/80 mmHg. If it remains high after one week on the new dose, send a MyChart message for a possible further increase to 600 mg twice daily. Follow up with your gynecologist to discuss the herpes simplex diagnosis and review any relevant test results. We will have a follow-up appointment in six weeks.

## 2023-11-22 ENCOUNTER — Other Ambulatory Visit: Payer: Self-pay

## 2023-11-24 ENCOUNTER — Other Ambulatory Visit: Payer: Self-pay

## 2023-12-19 ENCOUNTER — Encounter: Payer: Self-pay | Admitting: Family Medicine

## 2023-12-19 ENCOUNTER — Encounter: Payer: Self-pay | Admitting: Obstetrics and Gynecology

## 2023-12-19 DIAGNOSIS — N96 Recurrent pregnancy loss: Secondary | ICD-10-CM

## 2023-12-20 ENCOUNTER — Other Ambulatory Visit: Payer: Self-pay

## 2023-12-20 ENCOUNTER — Other Ambulatory Visit: Payer: Self-pay | Admitting: Family Medicine

## 2023-12-20 DIAGNOSIS — I10 Essential (primary) hypertension: Secondary | ICD-10-CM

## 2023-12-20 MED ORDER — LABETALOL HCL 200 MG PO TABS: 200.0000 mg | ORAL_TABLET | Freq: Two times a day (BID) | ORAL | 2 refills | Status: DC

## 2023-12-20 MED ORDER — NIFEDIPINE ER OSMOTIC RELEASE 60 MG PO TB24
60.0000 mg | ORAL_TABLET | Freq: Every day | ORAL | 1 refills | Status: DC
  Filled 2023-12-20: qty 30, 30d supply, fill #0

## 2023-12-20 MED ORDER — LABETALOL HCL 200 MG PO TABS
200.0000 mg | ORAL_TABLET | Freq: Two times a day (BID) | ORAL | 2 refills | Status: DC
  Filled 2023-12-20: qty 60, 30d supply, fill #0
  Filled 2024-01-16: qty 60, 30d supply, fill #1
  Filled 2024-02-13: qty 60, 30d supply, fill #2

## 2023-12-21 ENCOUNTER — Other Ambulatory Visit: Payer: Self-pay

## 2023-12-21 ENCOUNTER — Other Ambulatory Visit

## 2023-12-21 DIAGNOSIS — N96 Recurrent pregnancy loss: Secondary | ICD-10-CM | POA: Diagnosis not present

## 2023-12-22 LAB — LUPUS ANTICOAGULANT
Dilute Viper Venom Time: 32.3 s (ref 0.0–47.0)
PTT Lupus Anticoagulant: 31.1 s (ref 0.0–43.5)
Thrombin Time: 17.7 s (ref 0.0–23.0)
dPT Confirm Ratio: 0.96 ratio (ref 0.00–1.34)
dPT: 32.7 s (ref 0.0–47.6)

## 2023-12-22 LAB — BETA-2 GLYCOPROTEIN I AB,G/M
Beta-2 Glyco 1 IgM: <9 GPI IgM units (ref 0–32)
Beta-2 Glyco I IgG: <9 GPI IgG units (ref 0–20)

## 2023-12-23 ENCOUNTER — Encounter: Payer: Self-pay | Admitting: Obstetrics and Gynecology

## 2023-12-23 DIAGNOSIS — D6861 Antiphospholipid syndrome: Secondary | ICD-10-CM | POA: Insufficient documentation

## 2023-12-23 LAB — CARDIOLIPIN ANTIBODIES, IGM+IGG
Anticardiolipin IgG: <9 GPL U/mL (ref 0–14)
Anticardiolipin IgM: 15 [MPL'U]/mL — ABNORMAL HIGH (ref 0–12)

## 2023-12-30 DIAGNOSIS — Z419 Encounter for procedure for purposes other than remedying health state, unspecified: Secondary | ICD-10-CM | POA: Diagnosis not present

## 2024-01-03 ENCOUNTER — Other Ambulatory Visit: Payer: Self-pay

## 2024-01-03 ENCOUNTER — Telehealth: Payer: Self-pay

## 2024-01-03 ENCOUNTER — Ambulatory Visit: Attending: Family Medicine | Admitting: Family Medicine

## 2024-01-03 ENCOUNTER — Encounter: Payer: Self-pay | Admitting: Family Medicine

## 2024-01-03 VITALS — BP 148/100 | HR 81 | Wt 287.6 lb

## 2024-01-03 DIAGNOSIS — D6861 Antiphospholipid syndrome: Secondary | ICD-10-CM | POA: Diagnosis not present

## 2024-01-03 DIAGNOSIS — R42 Dizziness and giddiness: Secondary | ICD-10-CM | POA: Diagnosis not present

## 2024-01-03 DIAGNOSIS — I10 Essential (primary) hypertension: Secondary | ICD-10-CM

## 2024-01-03 DIAGNOSIS — D508 Other iron deficiency anemias: Secondary | ICD-10-CM | POA: Diagnosis not present

## 2024-01-03 DIAGNOSIS — Z636 Dependent relative needing care at home: Secondary | ICD-10-CM | POA: Diagnosis not present

## 2024-01-03 MED ORDER — NIFEDIPINE ER 90 MG PO TB24
90.0000 mg | ORAL_TABLET | Freq: Every day | ORAL | 1 refills | Status: DC
  Filled 2024-01-03: qty 30, 30d supply, fill #0
  Filled 2024-01-29 (×2): qty 30, 30d supply, fill #1

## 2024-01-03 NOTE — Patient Instructions (Signed)
 VISIT SUMMARY:  During today's visit, we discussed your hypertension management, recent diagnosis of Antiphospholipid Syndrome (APS), anemia, dizziness, and caregiver stress. We adjusted your medications and planned further evaluations to address your symptoms and concerns.  YOUR PLAN:  -HYPERTENSION: Hypertension is high blood pressure. Your stomach cramps and nausea improved after reducing the dose of labetalol. We have increased your nifedipine dose from 60 mg to 90 mg to better control your blood pressure. Please monitor your blood pressure at home and report your readings after one week.  -ANTIPHOSPHOLIPID SYNDROME (APS): APS is an immune disorder that increases the risk of blood clots and can affect pregnancy. Given your history of miscarriages and elevated anticardiolipin antibodies, we are referring you to a hematologist for further evaluation and management recommendations.  -ANEMIA: Anemia is a condition where you don't have enough healthy red blood cells to carry adequate oxygen to your body's tissues. Your hemoglobin level was previously 10.8 g/dL. We will order a blood test to check your current hemoglobin level. Please continue taking your iron supplements twice daily.  -DIZZINESS: Your dizziness may be related to stress, anemia, or sinus issues. We will check your hemoglobin level to see if anemia is contributing to your dizziness. Additionally, you may consider using Flonase daily to address potential sinus-related dizziness.  -CAREGIVER STRESS: Caregiver stress can significantly impact your well-being. We will arrange for a registered nurse to discuss potential resources and support options to help you manage your caregiving responsibilities.  INSTRUCTIONS:  Please monitor your blood pressure at home and report your readings after one week. We will also order a blood test to check your current hemoglobin level. Additionally, we will arrange for a registered nurse to discuss  potential resources and support options for caregiving. Follow up with the hematologist as planned for further evaluation and management of APS.

## 2024-01-03 NOTE — Telephone Encounter (Signed)
 At the request of Dr Adan Holms, I met with the patient when she was in the clinic today.    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 This responsibility is causing a great deal of stress for her mentally and physically. She has not been able to work and is trying to manage her own medical conditions.  She was agreeable to being referred to [facility] for SW intervention to help her manage her caregiver stress.  She is very interested in receiving assistance with caring for her mother. Currently her mother has [employer] but she is not sure if her mother has Medicaid.  I explained to her that we need to see if she has Medicaid and if she does, she could qualify for PCS.  Another option would be CAP services.  Even without standard Medicaid, she may qualify for CAP.  I asked the patient if I could call her mother to inquire about Medicaid and she said yes.  Her mother is Caitlan Chauca- DOB: 10/21/1957.  I  provided the patient with the phone number for [facility]/ CAP services: [phone]  If her mother qualifies, she Ninoshka Wainwright) could be paid to care for her mother. The patient accepted the phone number and said she would call.    I instructed her to please call me if she has further questions.

## 2024-01-03 NOTE — Progress Notes (Signed)
 Subjective:  Patient ID: Ashlee Fox, female    DOB: Feb 20, 1986  Age: 38 y.o. MRN: [id number]  CC: Medical Management of Chronic Issues and Dizziness     Discussed the use of AI scribe software for clinical note transcription with the patient, who gave verbal consent to proceed.  History of Present Illness The patient, with a history of hypertension, presents with side effects from an increased dose of labetalol 200 to 400 mg. She reports stomach cramping and nausea, which improved upon decreasing the dose. She also mentions a recent diagnosis of Antiphospholipid syndrome (APS), made by her fertility specialist. The patient is awaiting a follow-up appointment to discuss treatment options for APS. 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  In addition to labetalol, the patient is also taking nifedipine for hypertension. She reports similarly elevated blood pressure readings at home to those taken in the clinic.  The patient also reports occasional dizziness, which is not related to position changes and is not accompanied by other symptoms of vertigo. She describes the sensation as feeling "wobbly" and uncontrolled.  Denies sense of the room spinning.  This is more pronounced when she is stressed or on edge.  She does have sinus symptoms and has been taking Zyrtec regularly but not Flonase.  The patient is also anemic and taking iron tablets twice daily.  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  Past Medical History:  Diagnosis Date   Anemia    Complication of anesthesia    Hx MRSA infection    Hypertension    Morbid obesity (HCC) 10/27/2017    Past Surgical History:  Procedure Laterality Date   CARPAL TUNNEL RELEASE Right 12/15/2021   Procedure:  RIGHT CARPAL TUNNEL RELEASE;  Surgeon: Marlyne Beards, MD;  Location: [facility];  Service: Orthopedics;  Laterality: Right;   TONSILLECTOMY     ULNAR NERVE TRANSPOSITION Right 12/15/2021   Procedure: RIGHT ULNAR NERVE DECOMPRESSION/TRANSPOSITION;  Surgeon: Marlyne Beards, MD;  Location: [facility];  Service: Orthopedics;  Laterality: Right;    Family History  Problem Relation Age of Onset   Diabetes Maternal Grandmother    Hypertension Maternal Grandmother    Breast cancer Maternal Uncle    Diabetes Maternal Uncle     Social History   Socioeconomic History   Marital status: Single    Spouse name: Not on file   Number of children: 0   Years of education: Not on file   Highest education level: Associate degree: occupational, Scientist, product/process development, or vocational program  Occupational History   Not on file  Tobacco Use   Smoking status: Never   Smokeless tobacco: Never  Vaping Use   Vaping status: Never Used  Substance and Sexual Activity   Alcohol use: No   Drug use: Yes    Types: Marijuana    Comment: 'avid user'   Sexual activity: Yes    Birth control/protection: None  Other Topics Concern   Not on file  Social History Narrative   Not on file   Social Drivers of Health   Financial Resource Strain: Patient Declined (10/18/2023)   Overall Financial Resource Strain (CARDIA)    Difficulty of Paying Living Expenses: Patient declined  Food Insecurity:  Patient Declined (10/18/2023)   Hunger Vital Sign    Worried About Running Out of Food in the Last Year: Patient declined    Ran Out of Food in the Last Year: Patient declined  Transportation Needs: No Transportation Needs (10/18/2023)   PRAPARE - Administrator, Civil Service (Medical): No    Lack of Transportation (Non-Medical): No  Physical Activity: Unknown (10/18/2023)   Exercise Vital Sign    Days of Exercise per Week: Patient declined    Minutes of Exercise per Session: Not on file   Stress: Patient Declined (10/18/2023)   [employer] of Occupational Health - Occupational Stress Questionnaire    Feeling of Stress : Patient declined  Social Connections: Unknown (10/18/2023)   Social Connection and Isolation Panel [NHANES]    Frequency of Communication with Friends and Family: Patient declined    Frequency of Social Gatherings with Friends and Family: Patient declined    Attends Religious Services: Patient declined    Database administrator or Organizations: No    Attends Engineer, structural: Not on file    Marital Status: Patient declined    Allergies  Allergen Reactions   Amoxicillin Anaphylaxis   Pineapple Itching   Latex Rash    Outpatient Medications Prior to Visit  Medication Sig Dispense Refill   cetirizine (ZYRTEC) 10 MG tablet Take 1 tablet (10 mg total) by mouth daily. 30 tablet 1   chlorhexidine (HIBICLENS) 4 % external liquid Apply topically daily as needed. 120 mL 0   cholecalciferol (VITAMIN D) 1000 units tablet Take 1 capsule by mouth daily.     doxycycline (VIBRA-TABS) 100 MG tablet Take 1 tablet (100 mg total) by mouth 2 (two) times daily. 14 tablet 0   Ferrous Sulfate (IRON) 325 (65 Fe) MG TABS Take 1 tablet by mouth twice daily 180 tablet 0   fluticasone (FLONASE) 50 MCG/ACT nasal spray Place 2 sprays into both nostrils daily. 16 g 1   labetalol (NORMODYNE) 200 MG tablet Take 1 tablet (200 mg total) by mouth 2 (two) times daily. 60 tablet 2   Multiple Vitamin (MULTIVITAMIN WITH MINERALS) TABS Take 1 tablet by mouth daily.     terbinafine (LAMISIL) 250 MG tablet Take 1 tablet (250 mg total) by mouth daily. 90 tablet 0   terbinafine (LAMISIL) 250 MG tablet Take 1 tablet (250 mg total) by mouth daily. 90 tablet 0   NIFEdipine (PROCARDIA XL/NIFEDICAL XL) 60 MG 24 hr tablet Take 1 tablet (60 mg total) by mouth daily. 30 tablet 1   predniSONE (DELTASONE) 20 MG tablet Take 1 tablet (20 mg total) by mouth daily with breakfast. 5 tablet 0    metFORMIN (GLUCOPHAGE) 500 MG tablet TAKE 1 TABLET BY MOUTH TWICE DAILY WITH A MEAL (Patient not taking: Reported on 08/03/2023) 60 tablet 0   naproxen (NAPROSYN) 500 MG tablet TAKE 1 TABLET BY MOUTH TWICE DAILY WITH MEALS AS NEEDED FOR PAIN (Patient not taking: Reported on 11/21/2023) 60 tablet 0   No facility-administered medications prior to visit.     ROS Review of Systems  Constitutional:  Negative for activity change and appetite change.  HENT:  Negative for sinus pressure and sore throat.   Respiratory:  Negative for chest tightness, shortness of breath and wheezing.   Cardiovascular:  Negative for chest pain and palpitations.  Gastrointestinal:  Negative for abdominal distention, abdominal pain and constipation.  Genitourinary: Negative.   Musculoskeletal: Negative.   Neurological:  Positive for dizziness.  Psychiatric/Behavioral:  Negative for behavioral problems and dysphoric mood.     Objective:  BP (!) 161/104 (BP Location: Left Arm, Patient Position: Sitting)   Pulse 81   Wt 287 lb 9.6 oz (130.5 kg)   SpO2 100%   BMI 41.27 kg/m      01/03/2024   10:12 AM 11/21/2023    9:31 AM 10/19/2023   12:19 PM  BP/Weight  Systolic BP 161 183 177  Diastolic BP 104 104 106  Wt. (Lbs) 287.6 294.6   BMI 41.27 kg/m2 42.27 kg/m2       Physical Exam Constitutional:      Appearance: She is well-developed.  HENT:     Right Ear: Tympanic membrane normal.     Left Ear: Tympanic membrane normal.     Mouth/Throat:     Mouth: Mucous membranes are moist.  Cardiovascular:     Rate and Rhythm: Normal rate.     Heart sounds: Normal heart sounds. No murmur heard. Pulmonary:     Effort: Pulmonary effort is normal.     Breath sounds: Normal breath sounds. No wheezing or rales.  Chest:     Chest wall: No tenderness.  Abdominal:     General: Bowel sounds are normal. There is no distension.     Palpations: Abdomen is soft. There is no mass.     Tenderness: There is no abdominal  tenderness.  Musculoskeletal:        General: Normal range of motion.     Right lower leg: No edema.     Left lower leg: No edema.  Neurological:     Mental Status: She is alert and oriented to person, place, and time.  Psychiatric:        Mood and Affect: Mood normal.        Latest Ref Rng & Units 04/18/2023   10:47 AM 03/29/2023    2:06 PM 11/24/2022    4:06 PM  CMP  Glucose 70 - 99 mg/dL 86     BUN 6 - 20 mg/dL 6     Creatinine 7.42 - 1.00 mg/dL 5.95     Sodium 638 - 756 mmol/L 139     Potassium 3.5 - 5.2 mmol/L 3.6     Chloride 96 - 106 mmol/L 103     CO2 20 - 29 mmol/L 23     Calcium 8.7 - 10.2 mg/dL 8.9     Total Protein 6.0 - 8.5 g/dL 7.1  7.8  6.7   Total Bilirubin 0.0 - 1.2 mg/dL 0.4  0.5  <4.3   Alkaline Phos 44 - 121 IU/L 55   63   AST 0 - 40 IU/L 13  11  11    ALT 0 - 32 IU/L 19  20  14      Lipid Panel     Component Value Date/Time   CHOL 151 04/28/2021 0851   TRIG 81 04/28/2021 0851   HDL 36 (L) 04/28/2021 0851   CHOLHDL 4.2 04/28/2021 0851   LDLCALC 99 04/28/2021 0851    CBC    Component Value Date/Time   WBC 9.4 10/17/2022 1645   WBC 9.6 05/13/2012 1515   RBC 3.49 (L) 10/17/2022 1645   RBC 4.10 05/13/2012 1515   HGB 10.8 (L) 10/17/2022 1645   HCT 33.0 (L) 10/17/2022 1645   PLT 311 10/17/2022 1645   MCV 95 10/17/2022 1645   MCH 30.9 10/17/2022 1645   MCH 28.3 05/13/2012 1515   MCHC 32.7 10/17/2022 1645   MCHC  32.0 05/13/2012 1515   RDW 11.4 (L) 10/17/2022 1645   LYMPHSABS 4.2 (H) 10/17/2022 1645   MONOABS 0.7 05/13/2012 1515   EOSABS 0.3 10/17/2022 1645   BASOSABS 0.0 10/17/2022 1645    Lab Results  Component Value Date   HGBA1C 5.0 04/18/2023       Assessment & Plan Hypertension Stomach cramps and nausea from increased labetalol improved with dose reduction. Blood pressure uncontrolled with current medication regimen. Nifedipine dose increased for better control. - Increase nifedipine dose from 60 mg to 90 mg. - Monitor blood  pressure at home and report readings after one week. -Counseled on blood pressure goal of less than 130/80, low-sodium, DASH diet, medication compliance, 150 minutes of moderate intensity exercise per week. Discussed medication compliance, adverse effects.   Antiphospholipid Syndrome (APS) APS with elevated anticardiolipin antibodies and history of miscarriages. -She has a questionable history of thromboembolic disorder in her mom - Refer to hematologist for further evaluation and management recommendations in the light of desire to conceive.  Anemia Hemoglobin level previously 10.8 g/dL. Symptoms include feeling cold and dizziness. Blood count needed to assess current status. - Order blood test to check current hemoglobin level. - Continue iron supplementation twice daily.  Dizziness Intermittent dizziness, possibly stress-related or due to anemia. Sinus-related causes considered. -Low suspicion for vertigo - Check hemoglobin level to assess for anemia-related dizziness. - Consider using Flonase daily to address potential sinus-related dizziness.  Caregiver Stress Significant stress from caregiving responsibilities. Open to exploring support resources. - Arrange for case manager to discuss potential resources and support options for caregiving.      Meds ordered this encounter  Medications   NIFEdipine (ADALAT CC) 90 MG 24 hr tablet    Sig: Take 1 tablet (90 mg total) by mouth daily.    Dispense:  30 tablet    Refill:  1    Dose increased    Follow-up: No follow-ups on file.       Joaquin Mulberry, MD, FAAFP. [facility] [place], [place] [phone]   01/03/2024, 10:43 AM

## 2024-01-04 ENCOUNTER — Other Ambulatory Visit: Payer: Self-pay

## 2024-01-04 ENCOUNTER — Encounter: Payer: Self-pay | Admitting: Family Medicine

## 2024-01-04 LAB — CBC WITH DIFFERENTIAL/PLATELET
Basophils Absolute: 0 10*3/uL (ref 0.0–0.2)
Basos: 0 %
EOS (ABSOLUTE): 0.2 10*3/uL (ref 0.0–0.4)
Eos: 2 %
Hematocrit: 38.3 % (ref 34.0–46.6)
Hemoglobin: 12.9 g/dL (ref 11.1–15.9)
Immature Grans (Abs): 0 10*3/uL (ref 0.0–0.1)
Immature Granulocytes: 0 %
Lymphocytes Absolute: 2.4 10*3/uL (ref 0.7–3.1)
Lymphs: 34 %
MCH: 31.8 pg (ref 26.6–33.0)
MCHC: 33.7 g/dL (ref 31.5–35.7)
MCV: 94 fL (ref 79–97)
Monocytes Absolute: 0.5 10*3/uL (ref 0.1–0.9)
Monocytes: 7 %
Neutrophils Absolute: 4 10*3/uL (ref 1.4–7.0)
Neutrophils: 57 %
Platelets: 275 10*3/uL (ref 150–450)
RBC: 4.06 x10E6/uL (ref 3.77–5.28)
RDW: 11.5 % — ABNORMAL LOW (ref 11.7–15.4)
WBC: 7.1 10*3/uL (ref 3.4–10.8)

## 2024-01-04 LAB — CMP14+EGFR
ALT: 16 IU/L (ref 0–32)
AST: 14 IU/L (ref 0–40)
Albumin: 4 g/dL (ref 3.9–4.9)
Alkaline Phosphatase: 69 IU/L (ref 44–121)
BUN/Creatinine Ratio: 12 (ref 9–23)
BUN: 8 mg/dL (ref 6–20)
Bilirubin Total: 0.5 mg/dL (ref 0.0–1.2)
CO2: 21 mmol/L (ref 20–29)
Calcium: 9.1 mg/dL (ref 8.7–10.2)
Chloride: 104 mmol/L (ref 96–106)
Creatinine, Ser: 0.69 mg/dL (ref 0.57–1.00)
Globulin, Total: 3.4 g/dL (ref 1.5–4.5)
Glucose: 81 mg/dL (ref 70–99)
Potassium: 3.8 mmol/L (ref 3.5–5.2)
Sodium: 141 mmol/L (ref 134–144)
Total Protein: 7.4 g/dL (ref 6.0–8.5)
eGFR: 114 mL/min/{1.73_m2} (ref >59)

## 2024-01-04 LAB — LP+NON-HDL CHOLESTEROL
Cholesterol, Total: 146 mg/dL (ref 100–199)
HDL: 37 mg/dL — ABNORMAL LOW (ref >39)
LDL Chol Calc (NIH): 93 mg/dL (ref 0–99)
Total Non-HDL-Chol (LDL+VLDL): 109 mg/dL (ref 0–129)
Triglycerides: 84 mg/dL (ref 0–149)
VLDL Cholesterol Cal: 16 mg/dL (ref 5–40)

## 2024-01-05 ENCOUNTER — Telehealth: Payer: Self-pay | Admitting: *Deleted

## 2024-01-05 NOTE — Progress Notes (Signed)
 Complex Care Management Note  Care Guide Note 01/05/2024 Name: Ashlee Fox MRN: [id number] DOB: 07-17-1986  Ashlee Fox is a 38 y.o. year old female who sees Delbert Clam, MD for primary care. I reached out to Geni ONEIDA Muskrat by phone today to offer complex care management services.  [name] was given information about Complex Care Management services today including:   The Complex Care Management services include support from the care team which includes your Nurse Care Manager, Clinical Social Worker, or Pharmacist.  The Complex Care Management team is here to help remove barriers to the health concerns and goals most important to you. Complex Care Management services are voluntary, and the patient may decline or stop services at any time by request to their care team member.   Complex Care Management Consent Status: Patient agreed to services and verbal consent obtained.   Follow up plan:  Telephone appointment with complex care management team member scheduled for:  01/10/24  Encounter Outcome:  Patient Scheduled  Harlene Satterfield  [facility]  [facility], [facility] Guide  Direct Dial: [phone]  Fax [phone]

## 2024-01-10 ENCOUNTER — Other Ambulatory Visit: Payer: Self-pay | Admitting: Licensed Clinical Social Worker

## 2024-01-10 NOTE — Patient Outreach (Signed)
 Complex Care Management   Visit Note  01/10/2024  Name:  Ashlee Fox MRN: [id number] DOB: [birth year]  Situation: Referral received for Complex Care Management related to  client management of stress issues faced  I obtained verbal consent from Patient.  Visit completed with patient  on the phone  Background:   Past Medical History:  Diagnosis Date   Anemia    Complication of anesthesia    Hx MRSA infection    Hypertension    Morbid obesity (HCC) 10/27/2017    Assessment: Patient Reported Symptoms:  Cognitive    No issues noted    Neurological  PTSD    HEENT    Dizzy occasionally. Occasional headache    Cardiovascular  HTN Weight: 294 lb (133.4 kg)  Respiratory    No issues noted  Endocrine    No issues noted  Gastrointestinal    Has history of weight loss. Is working to eat healthy foods and to watch her weight    Genitourinary    No issues noted  Integumentary    Unable to access  Musculoskeletal    Has fatigue issues. May get tired or fatigued occasionally when lifting objects or walking a certain distance      Psychosocial  Financial Stress; Stress with caregiving responsibilities   Occasional headaches; job pressure; stress in managing medical needs of client  Sleep issues Do you feel physically threatened by others?: No      01/10/2024   10:22 AM  Depression screen PHQ 2/9  Decreased Interest 1  Down, Depressed, Hopeless 1  PHQ - 2 Score 2  Altered sleeping 1  Tired, decreased energy 1  Change in appetite 0  Feeling bad or failure about yourself  1  Trouble concentrating 0  Moving slowly or fidgety/restless 1  Suicidal thoughts 0  PHQ-9 Score 6  Difficult doing work/chores Somewhat difficult    Vitals:   Client is communicating with PCP to monitor client BP levels Medications Reviewed Today     Reviewed by Afton Horse (Social Worker) on 01/10/24 at 1014  Med List Status: <None>   Medication Order Taking? Sig Documenting  Provider Last Dose Status Informant  cetirizine  (ZYRTEC ) 10 MG tablet [id number] No Take 1 tablet (10 mg total) by mouth daily. Newlin, Enobong, MD Taking Active   chlorhexidine  (HIBICLENS ) 4 % external liquid [id number] No Apply topically daily as needed. Newlin, Enobong, MD Taking Active   cholecalciferol (VITAMIN D) 1000 units tablet [id number] No Take 1 capsule by mouth daily. [provider] Taking Active   doxycycline  (VIBRA -TABS) 100 MG tablet [id number] No Take 1 tablet (100 mg total) by mouth 2 (two) times daily. Newlin, Enobong, MD Taking Active   Ferrous Sulfate  (IRON ) 325 (65 Fe) MG TABS [id number] No Take 1 tablet by mouth twice daily Newlin, Enobong, MD Taking Active   fluticasone  (FLONASE ) 50 MCG/ACT nasal spray [id number] No Place 2 sprays into both nostrils daily. Newlin, Enobong, MD Taking Active   labetalol  (NORMODYNE ) 200 MG tablet [id number] No Take 1 tablet (200 mg total) by mouth 2 (two) times daily. Newlin, Enobong, MD Taking Active   Multiple Vitamin (MULTIVITAMIN WITH MINERALS) TABS [id number] No Take 1 tablet by mouth daily. [provider] Taking Active Self  NIFEdipine  (ADALAT  CC) 90 MG 24 hr tablet [id number]  Take 1 tablet (90 mg total) by mouth daily. Newlin, Enobong, MD  Active   terbinafine  (LAMISIL ) 250 MG tablet [id number] No Take 1 tablet (250 mg total) by mouth  daily. Velma Ghazi, DPM Taking Active   terbinafine  (LAMISIL ) 250 MG tablet [id number] No Take 1 tablet (250 mg total) by mouth daily. Velma Ghazi, DPM Taking Active             Recommendation:   PCP Follow-up  Client has appointment with PCP in June of 2025 Client to attend appointment with Hematologist on Feb 08, 2024 Client to attend appointment with OBGYN on Feb 13, 2024 Client to take time for self care Client to work job as she is able Client to continue to provide support for her mother who lives with client Client to take medications as prescribed Client to call LCSW as needed  for SW support at [phone]  Follow Up Plan:   Client has LCSW name and phone number and was encouraged to call LCSW for SW support at [phone]   Alexandria Angel  MSW, LCSW [place] Licensed Clinical Social Worker Direct Dial:  [phone] Fax:  [phone] Website:  [email or web address]

## 2024-01-10 NOTE — Patient Instructions (Signed)
 Visit Information  Thank you for taking time to visit with me today. Please don't hesitate to contact me if I can be of assistance to you before our next scheduled appointment.  Client to call LCSW as needed for SW support at [phone]  Please call the care guide team at [phone] if you need to cancel or reschedule your appointment.   Following is a copy of your care plan:   Goals Addressed             This Visit's Progress    VBCI Social Work Care Plan       Problems:     Caregiver stress issues                   Fatigue issues (low energy)                   BP management ongoing                   Works Pharmacist, community daily (independent). Also cares for needs of her mother                   Financial challenges  CSW Clinical Goal(s):   Over the next 30  days the Patient will  attend all scheduled medical appointments AEB patient report and documentation in EPIC record .             Over next 30 days, patient will use coping skills to manage anxiety and depression issues AEB patient report of decreased symptoms of anxiety or depression  Interventions:  Discussed medication procurement of client              Discussed pain issues of client.  Discussed energy level of client. Discussed appetite of client              Discussed sleeping issues of client              Discussed BP of client. She is communicating with PCP in monitoring BP of client             Discussed. Client desire to be a mother             Discussed client history of miscarriages             Discussed client appointment with hematologist on Feb 08, 2024             Discussed client upcoming appointment with OBGYN             Discussed client employment through [employer]. Discussed her work schedule             Discussed her caregiving responsibility for her mother. (Her mother resides with Ardelia Beau)             Discussed mood of client.  She is concerned about finances and about caregiving responsibilities for her  mother             Talked with Ardelia Beau about program support with RN, LCSW, Pharmacist.               Discussed hobbies:  listen to all types of music             Discussed social network; she has a close group of friends  Patient Goals/Self-Care Activities:  Attend scheduled medical appointments             Take medications as prescribed  Communicate with PCP as needed to discuss client medical issues             Allow time for self care              Communicate with friends as a form of socialization             Use coping skills to manage anxiety and stress issues faced  Plan:   LCSW gave client LCSW name and phone number. LCSW encouraged client to call LCSW as needed for SW support        Please go to [facility] [address], [place] [phone]) if you are experiencing a Mental Health or Behavioral Health Crisis or need someone to talk to.  The patient verbalized understanding of instructions, educational materials, and care plan provided today and DECLINED offer to receive copy of patient instructions, educational materials, and care plan.   Patient was provided contact information for Care Team for support. LCSW gave Agapito Horseman name and phone number and encouraged Areej to call LCSW for SW support at [phone]   Alexandria Angel  MSW, LCSW [place] Licensed Clinical Social Worker Direct Dial:  [phone] Fax:  [phone] Website:  [email or web address]

## 2024-01-19 ENCOUNTER — Other Ambulatory Visit: Payer: Self-pay

## 2024-01-27 ENCOUNTER — Other Ambulatory Visit: Payer: Self-pay | Admitting: Family Medicine

## 2024-01-29 ENCOUNTER — Other Ambulatory Visit: Payer: Self-pay

## 2024-01-29 DIAGNOSIS — Z419 Encounter for procedure for purposes other than remedying health state, unspecified: Secondary | ICD-10-CM | POA: Diagnosis not present

## 2024-01-30 ENCOUNTER — Other Ambulatory Visit (HOSPITAL_COMMUNITY): Payer: Self-pay

## 2024-01-30 ENCOUNTER — Other Ambulatory Visit: Payer: Self-pay

## 2024-02-01 ENCOUNTER — Other Ambulatory Visit: Payer: Self-pay

## 2024-02-08 ENCOUNTER — Inpatient Hospital Stay: Attending: Hematology and Oncology | Admitting: Hematology and Oncology

## 2024-02-08 ENCOUNTER — Inpatient Hospital Stay

## 2024-02-08 VITALS — BP 147/79 | HR 87 | Temp 98.6°F | Resp 16 | Wt 299.9 lb

## 2024-02-08 DIAGNOSIS — N920 Excessive and frequent menstruation with regular cycle: Secondary | ICD-10-CM | POA: Diagnosis not present

## 2024-02-08 DIAGNOSIS — Z800 Family history of malignant neoplasm of digestive organs: Secondary | ICD-10-CM | POA: Diagnosis not present

## 2024-02-08 DIAGNOSIS — Z803 Family history of malignant neoplasm of breast: Secondary | ICD-10-CM | POA: Insufficient documentation

## 2024-02-08 DIAGNOSIS — D500 Iron deficiency anemia secondary to blood loss (chronic): Secondary | ICD-10-CM

## 2024-02-08 DIAGNOSIS — N96 Recurrent pregnancy loss: Secondary | ICD-10-CM | POA: Diagnosis not present

## 2024-02-08 DIAGNOSIS — D509 Iron deficiency anemia, unspecified: Secondary | ICD-10-CM | POA: Insufficient documentation

## 2024-02-08 LAB — CBC WITH DIFFERENTIAL (CANCER CENTER ONLY)
Abs Immature Granulocytes: 0.02 10*3/uL (ref 0.00–0.07)
Basophils Absolute: 0 10*3/uL (ref 0.0–0.1)
Basophils Relative: 1 %
Eosinophils Absolute: 0.2 10*3/uL (ref 0.0–0.5)
Eosinophils Relative: 2 %
HCT: 32.1 % — ABNORMAL LOW (ref 36.0–46.0)
Hemoglobin: 10.6 g/dL — ABNORMAL LOW (ref 12.0–15.0)
Immature Granulocytes: 0 %
Lymphocytes Relative: 25 %
Lymphs Abs: 2.1 10*3/uL (ref 0.7–4.0)
MCH: 31.5 pg (ref 26.0–34.0)
MCHC: 33 g/dL (ref 30.0–36.0)
MCV: 95.5 fL (ref 80.0–100.0)
Monocytes Absolute: 0.7 10*3/uL (ref 0.1–1.0)
Monocytes Relative: 9 %
Neutro Abs: 5.4 10*3/uL (ref 1.7–7.7)
Neutrophils Relative %: 63 %
Platelet Count: 251 10*3/uL (ref 150–400)
RBC: 3.36 MIL/uL — ABNORMAL LOW (ref 3.87–5.11)
RDW: 12.8 % (ref 11.5–15.5)
WBC Count: 8.4 10*3/uL (ref 4.0–10.5)
nRBC: 0 % (ref 0.0–0.2)

## 2024-02-08 LAB — IRON AND IRON BINDING CAPACITY (CC-WL,HP ONLY)
Iron: 56 ug/dL (ref 28–170)
Saturation Ratios: 22 % (ref 10.4–31.8)
TIBC: 258 ug/dL (ref 250–450)
UIBC: 202 ug/dL (ref 148–442)

## 2024-02-08 LAB — CMP (CANCER CENTER ONLY)
ALT: 19 U/L (ref 0–44)
AST: 13 U/L — ABNORMAL LOW (ref 15–41)
Albumin: 3.8 g/dL (ref 3.5–5.0)
Alkaline Phosphatase: 58 U/L (ref 38–126)
Anion gap: 6 (ref 5–15)
BUN: 11 mg/dL (ref 6–20)
CO2: 28 mmol/L (ref 22–32)
Calcium: 8.7 mg/dL — ABNORMAL LOW (ref 8.9–10.3)
Chloride: 106 mmol/L (ref 98–111)
Creatinine: 0.61 mg/dL (ref 0.44–1.00)
GFR, Estimated: >60 mL/min (ref >60)
Glucose, Bld: 92 mg/dL (ref 70–99)
Potassium: 3.6 mmol/L (ref 3.5–5.1)
Sodium: 140 mmol/L (ref 135–145)
Total Bilirubin: 0.2 mg/dL (ref 0.0–1.2)
Total Protein: 7.6 g/dL (ref 6.5–8.1)

## 2024-02-08 LAB — RETIC PANEL
Immature Retic Fract: 9.5 % (ref 2.3–15.9)
RBC.: 3.38 MIL/uL — ABNORMAL LOW (ref 3.87–5.11)
Retic Count, Absolute: 74.7 10*3/uL (ref 19.0–186.0)
Retic Ct Pct: 2.2 % (ref 0.4–3.1)
Reticulocyte Hemoglobin: 32.5 pg (ref >27.9)

## 2024-02-08 LAB — FERRITIN: Ferritin: 139 ng/mL (ref 11–307)

## 2024-02-08 NOTE — Progress Notes (Signed)
 [facility] [phone]   [phone]  INITIAL CONSULT NOTE  Patient Care Team: Joaquin Mulberry, MD as PCP - General (Family Medicine) Lacey Pian, MD as PCP - OBGYN (Obstetrics and Gynecology) Gaile Jourdain Hurley Maid as [facility] Care Management (Licensed Clinical Social Worker)  Hematological/Oncological History # Recurrent Pregnancy Loss # Elevated Anticardiolipin Antibodies  08/03/2023: Anticardiolipin IgM noted at 13 12/21/2023: Anticardiolipin IgM noted at 15 02/08/2024: establish care with Dr. Rosaline Coma   CHIEF COMPLAINTS/PURPOSE OF CONSULTATION:  "Elevated Anticardiolipin Antibodies  "  HISTORY OF PRESENTING ILLNESS:  Ashlee Fox 38 y.o. female with medical history significant for recurrent pregnancy loss and iron  deficiency anemia who presents for evaluation of possible APS.   On review of the previous records [name] underwent antiphospholipid antibody testing as part of her fertility workup.  Her evaluation did not find any overt abnormalities in the beta-2  glycoprotein or lupus anticoagulant panel, however her anticardiolipin IgM's were elevated at 13 on 08/03/2023.  This was subsequently repeated on 12/21/2023 which showed an elevation at 15.  Due to concern for these findings the patient was referred to hematology for further evaluation management.  On exam today [name] reports that she is here "for third opinion".  She reports her OB ran a test and there is some discrepancy between the interpretation with her OB provider and her PCP.  Her OB provider has high suspicion of anticardiolipin antibodies causing APS and recurrent pregnancy loss/fertility issues.  The patient reports that she has had 4 miscarriages between 2004 and 2018.  She reports that most of these were early in the pregnancy with 1 reaching the point where she knew she had a baby girl.  She notes that she has been trying with her current boyfriend for 2 years to become pregnant  but has continued to have irregular menstrual cycles with no clear evidence that she has conceived or had a miscarriage during that time.  She reports that she has had a substantial amount of weight loss since her prior miscarriages having lost 160 pounds.  She notes that she has had some trouble with anemia in the past and has been on iron  pills.  She reports that her fertility workup thus far has also been negative.  On further discussion she reports no major health issues in the family that appear to be hereditary.  She notes that her mother had no cancer but her maternal great uncle had breast cancer and her maternal grandfather had stomach cancer.  She reports her grandmother has type 2 diabetes and high blood pressure with a maternal uncle having high blood sugars.  She reports that her mother has high cholesterol.  She reports that she does not smoke cigarettes but does enjoy black and milds from time to time.  She reports she is not currently drinking any alcohol.  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  She reports that her menstrual cycles are typically not that heavy with the first day being the heaviest.  She has to empty her cup 2 times per day.  She reports she is spotting by the fourth day.  She otherwise denies any fevers, chills, sweats, nausea, vomiting or diarrhea.  A full 10 point ROS is otherwise negative.  MEDICAL HISTORY:  Past Medical History:  Diagnosis Date   Anemia    Complication of anesthesia    Hx MRSA infection    Hypertension  Morbid obesity (HCC) 10/27/2017    SURGICAL HISTORY: Past Surgical History:  Procedure Laterality Date   CARPAL TUNNEL RELEASE Right 12/15/2021   Procedure: RIGHT CARPAL TUNNEL RELEASE;  Surgeon: Marilyn Shropshire, MD;  Location: [facility];  Service: Orthopedics;  Laterality: Right;   TONSILLECTOMY     ULNAR NERVE TRANSPOSITION Right 12/15/2021   Procedure:  RIGHT ULNAR NERVE DECOMPRESSION/TRANSPOSITION;  Surgeon: Marilyn Shropshire, MD;  Location: [facility];  Service: Orthopedics;  Laterality: Right;    SOCIAL HISTORY: Social History   Socioeconomic History   Marital status: Single    Spouse name: Not on file   Number of children: 0   Years of education: Not on file   Highest education level: Associate degree: occupational, Scientist, product/process development, or vocational program  Occupational History   Not on file  Tobacco Use   Smoking status: Never   Smokeless tobacco: Never  Vaping Use   Vaping status: Never Used  Substance and Sexual Activity   Alcohol use: No   Drug use: Yes    Types: Marijuana    Comment: 'avid user'   Sexual activity: Yes    Birth control/protection: None  Other Topics Concern   Not on file  Social History Narrative   Not on file   Social Drivers of Health   Financial Resource Strain: Patient Declined (10/18/2023)   Overall Financial Resource Strain (CARDIA)    Difficulty of Paying Living Expenses: Patient declined  Food Insecurity: Food Insecurity Present (02/08/2024)   Hunger Vital Sign    Worried About Running Out of Food in the Last Year: Sometimes true    Ran Out of Food in the Last Year: Sometimes true  Transportation Needs: No Transportation Needs (02/08/2024)   PRAPARE - Administrator, Civil Service (Medical): No    Lack of Transportation (Non-Medical): No  Physical Activity: Inactive (01/10/2024)   Exercise Vital Sign    Days of Exercise per Week: 0 days    Minutes of Exercise per Session: 0 min  Stress: Stress Concern Present (01/10/2024)   [employer] of Occupational Health - Occupational Stress Questionnaire    Feeling of Stress : To some extent  Social Connections: Unknown (10/18/2023)   Social Connection and Isolation Panel [NHANES]    Frequency of Communication with Friends and Family: Patient declined    Frequency of Social Gatherings with Friends and Family: Patient  declined    Attends Religious Services: Patient declined    Database administrator or Organizations: No    Attends Engineer, structural: Not on file    Marital Status: Patient declined  Intimate Partner Violence: Not At Risk (02/08/2024)   Humiliation, Afraid, Rape, and Kick questionnaire    Fear of Current or Ex-Partner: No    Emotionally Abused: No    Physically Abused: No    Sexually Abused: No    FAMILY HISTORY: Family History  Problem Relation Age of Onset   Diabetes Maternal Grandmother    Hypertension Maternal Grandmother    Breast cancer Maternal Uncle    Diabetes Maternal Uncle     ALLERGIES:  is allergic to amoxicillin, pineapple, and latex.  MEDICATIONS:  Current Outpatient Medications  Medication Sig Dispense Refill   cetirizine  (ZYRTEC ) 10 MG tablet Take 1 tablet (10 mg total) by mouth daily. 30 tablet 1   cholecalciferol (VITAMIN D) 1000 units tablet Take 1 capsule by mouth daily.     Ferrous Sulfate  (IRON ) 325 (65 Fe) MG TABS Take  1 tablet by mouth twice daily 180 tablet 1   labetalol  (NORMODYNE ) 200 MG tablet Take 1 tablet (200 mg total) by mouth 2 (two) times daily. 60 tablet 2   Multiple Vitamin (MULTIVITAMIN WITH MINERALS) TABS Take 1 tablet by mouth daily.     NIFEdipine  (ADALAT  CC) 90 MG 24 hr tablet Take 1 tablet (90 mg total) by mouth daily. 30 tablet 1   No current facility-administered medications for this visit.    REVIEW OF SYSTEMS:   Constitutional: ( - ) fevers, ( - )  chills , ( - ) night sweats Eyes: ( - ) blurriness of vision, ( - ) double vision, ( - ) watery eyes Ears, nose, mouth, throat, and face: ( - ) mucositis, ( - ) sore throat Respiratory: ( - ) cough, ( - ) dyspnea, ( - ) wheezes Cardiovascular: ( - ) palpitation, ( - ) chest discomfort, ( - ) lower extremity swelling Gastrointestinal:  ( - ) nausea, ( - ) heartburn, ( - ) change in bowel habits Skin: ( - ) abnormal skin rashes Lymphatics: ( - ) new lymphadenopathy, ( - )  easy bruising Neurological: ( - ) numbness, ( - ) tingling, ( - ) new weaknesses Behavioral/Psych: ( - ) mood change, ( - ) new changes  All other systems were reviewed with the patient and are negative.  PHYSICAL EXAMINATION:  Vitals:   02/08/24 1321  BP: (!) 147/79  Pulse: 87  Resp: 16  Temp: 98.6 F (37 C)  SpO2: 100%   Filed Weights   02/08/24 1321  Weight: 299 lb 14.4 oz (136 kg)    GENERAL: well appearing middle-age African-American female in NAD  SKIN: skin color, texture, turgor are normal, no rashes or significant lesions EYES: conjunctiva are pink and non-injected, sclera clear LUNGS: clear to auscultation and percussion with normal breathing effort HEART: regular rate & rhythm and no murmurs and no lower extremity edema Musculoskeletal: no cyanosis of digits and no clubbing  PSYCH: alert & oriented x 3, fluent speech NEURO: no focal motor/sensory deficits  LABORATORY DATA:  I have reviewed the data as listed    Latest Ref Rng & Units 02/08/2024    2:33 PM 01/03/2024   11:23 AM 10/17/2022    4:45 PM  CBC  WBC 4.0 - 10.5 K/uL 8.4  7.1  9.4   Hemoglobin 12.0 - 15.0 g/dL 56.3  87.5  64.3   Hematocrit 36.0 - 46.0 % 32.1  38.3  33.0   Platelets 150 - 400 K/uL 251  275  311        Latest Ref Rng & Units 02/08/2024    2:33 PM 01/03/2024   11:23 AM 04/18/2023   10:47 AM  CMP  Glucose 70 - 99 mg/dL 92  81  86   BUN 6 - 20 mg/dL 11  8  6    Creatinine 0.44 - 1.00 mg/dL 3.29  5.18  8.41   Sodium 135 - 145 mmol/L 140  141  139   Potassium 3.5 - 5.1 mmol/L 3.6  3.8  3.6   Chloride 98 - 111 mmol/L 106  104  103   CO2 22 - 32 mmol/L 28  21  23    Calcium 8.9 - 10.3 mg/dL 8.7  9.1  8.9   Total Protein 6.5 - 8.1 g/dL 7.6  7.4  7.1   Total Bilirubin 0.0 - 1.2 mg/dL 0.2  0.5  0.4   Alkaline Phos 38 - 126  U/L 58  69  55   AST 15 - 41 U/L 13  14  13    ALT 0 - 44 U/L 19  16  19       ASSESSMENT & PLAN Ashlee Fox 38 y.o. female with medical history significant for  recurrent pregnancy loss and iron  deficiency anemia who presents for evaluation of possible APS.   After review of the labs, review of the records, and discussion with the patient the patients findings are most consistent with recurrent pregnancy loss and fertility problems over the last 2 years.  Based on our evaluation of her labs I have very low clinical suspicion that she has antiphospholipid antibody syndrome.  She does not currently meet the criteria for this disease and therefore I do not believe that prophylactic anticoagulation is required if she would become pregnant again.  Antiphospholipid antibody syndrome is a hypercoagulation and autoimmune condition that results in increased frequency of venous and arterial thrombosis. In order to meet the diagnostic criteria for APS a patient must have one of both the clinical and laboratory criteria noted from the [place] Criteria (noted below). Laboratory criteria must be met on at least 2 occasions, separated by at least 12 weeks. Presence of antibodies in the absence of clinical criteria fails to meet diagnostic criteria and does not merit treatment or further investigation.     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 The patient does meet clinical criteria with recurrent pregnancy loss, but with her low titer of antibody does not meet diagnostic criteria for the diagnosis of APS  --patient has not prior history of VTE or arterial thrombosis --no indication for further testing at this time in our clinic. Recommend further evaluation with fertility expert.  --RTC PRN   # Iron  Deficiency Anemia 2/2 to GYN Bleeding -- Findings are consistent with iron  deficiency anemia secondary to patient's menorrhagia --Encouraged her to follow-up with OB/GYN for better control of her menstrual cycles --We will confirm iron  deficiency anemia by ordering iron  panel and ferritin as  well as reticulocytes, CBC, and CMP --Continue ferrous sulfate  325 mg daily with a source of vitamin C --We will plan to proceed with IV iron  therapy in order to help bolster the patient's blood counts if her levels are low.  --Plan for return to clinic pending the results of the above studies.     Orders Placed This Encounter  Procedures   CBC with Differential (Cancer Center Only)    Standing Status:   Future    Number of Occurrences:   1    Expiration Date:   02/07/2025   CMP (Cancer Center only)    Standing Status:   Future    Number of Occurrences:   1    Expiration Date:   02/07/2025   Ferritin    Standing Status:   Future    Number of Occurrences:   1    Expiration Date:   02/07/2025   Iron  and Iron  Binding Capacity ([facility] only)    Standing Status:   Future    Number of Occurrences:   1    Expiration Date:   02/07/2025   Retic Panel    Standing Status:   Future    Number of Occurrences:   1    Expiration Date:   02/07/2025    All questions were answered. The patient knows to call the clinic with any problems, questions or  concerns.  A total of more than 60 minutes were spent on this encounter with face-to-face time and non-face-to-face time, including preparing to see the patient, ordering tests and/or medications, counseling the patient and coordination of care as outlined above.   Rogerio Clay, MD Department of Hematology/Oncology [facility] Phone: [phone] Pager: [phone] Email: [email or web address]  02/10/2024 7:39 PM  Literature Support:  Kathern Panda MD, Autry Boas, Branch DW, Brey RL, [place], Derksen RH, [place], Lathan Poke, Meroni PL, Reber G, [place], Tincani A, Vlachoyiannopoulos PG, Krilis SA. International consensus statement on an update of the classification criteria for definite antiphospholipid syndrome (APS). J Thromb Haemost. 2006 Feb;4(2):295-306.   --Antiphospholipid antibody  syndrome (APS) is present if at least one of the clinical criteria and one of the laboratory criteria that follow are met* Clinical criteria 1. Vascular thrombosis One or more clinical episodes of arterial, venous, or small vessel thrombosis, in any tissue or organ. Thrombosis must be confirmed by objective validated criteria (i.e. unequivocal findings of appropriate imaging studies or histopathology). For histopathologic confirmation, thrombosis should be present without significant evidence of inflammation in the vessel wall. 2. Pregnancy morbidity (a) One or more unexplained deaths of a morphologically normal fetus at or beyond the 10th week of gestation, with normal fetal morphology documented by ultrasound or by direct examination of the fetus, or (b) One or more premature births of a morphologically normal neonate before the 34th week of gestation because of: (i) eclampsia or severe pre?eclampsia defined according to standard definitions, or (ii) recognized features of placental insufficiency or (c) Three or more unexplained consecutive spontaneous abortions before the 10th week of gestation, with maternal anatomic or hormonal abnormalities and paternal and maternal chromosomal causes excluded. In studies of populations of patients who have more than one type of pregnancy morbidity, investigators are strongly encouraged to stratify groups of subjects according to a, b, or c above.    Laboratory criteria 1. Lupus anticoagulant (LA) present in plasma, on two or more occasions at least 12 weeks apart, detected according to the guidelines of the International Society on Thrombosis and Haemostasis (Scientific Subcommittee on LAs/phospholipid?dependent antibodies) . 2. Anticardiolipin (aCL) antibody of IgG and/or IgM isotype in serum or plasma, present in medium or high titer (i.e. >40 GPL or MPL, or >the 99th percentile), on two or more occasions, at least 12 weeks apart, measured by a standardized  ELISA . 3. Anti??2 glycoprotein?I antibody of IgG and/or IgM isotype in serum or plasma (in titer >the 99th percentile), present on two or more occasions, at least 12 weeks apart, measured by a standardized ELISA, according to recommended procedures.     Leala Prince, Regan L. Randomised controlled trial of aspirin and aspirin plus heparin in pregnant women with recurrent miscarriage associated with phospholipid antibodies (or antiphospholipid antibodies). BMJ. 1997 Jan 25;314(7076):253-7.   --Treatment with aspirin and heparin leads to a significantly higher rate of live births in women with a history of recurrent miscarriage associated with phospholipid antibodies than that achieved with aspirin alone.

## 2024-02-11 ENCOUNTER — Telehealth: Admitting: Family Medicine

## 2024-02-11 DIAGNOSIS — M79604 Pain in right leg: Secondary | ICD-10-CM | POA: Diagnosis not present

## 2024-02-11 DIAGNOSIS — M545 Low back pain, unspecified: Secondary | ICD-10-CM

## 2024-02-11 MED ORDER — PREDNISONE 20 MG PO TABS
20.0000 mg | ORAL_TABLET | Freq: Two times a day (BID) | ORAL | 0 refills | Status: DC
  Filled 2024-02-11: qty 10, 5d supply, fill #0

## 2024-02-11 NOTE — Progress Notes (Signed)
 Virtual Visit Consent   Ashlee Fox, you are scheduled for a virtual visit with a [facility] provider today. Just as with appointments in the office, your consent must be obtained to participate. Your consent will be active for this visit and any virtual visit you may have with one of our providers in the next 365 days. If you have a MyChart account, a copy of this consent can be sent to you electronically.  As this is a virtual visit, video technology does not allow for your provider to perform a traditional examination. This may limit your provider's ability to fully assess your condition. If your provider identifies any concerns that need to be evaluated in person or the need to arrange testing (such as labs, EKG, etc.), we will make arrangements to do so. Although advances in technology are sophisticated, we cannot ensure that it will always work on either your end or our end. If the connection with a video visit is poor, the visit may have to be switched to a telephone visit. With either a video or telephone visit, we are not always able to ensure that we have a secure connection.  By engaging in this virtual visit, you consent to the provision of healthcare and authorize for your insurance to be billed (if applicable) for the services provided during this visit. Depending on your insurance coverage, you may receive a charge related to this service.  I need to obtain your verbal consent now. Are you willing to proceed with your visit today? Ashlee Fox has provided verbal consent on 02/11/2024 for a virtual visit (video or telephone). Albertha Huger, FNP  Date: 02/11/2024 3:09 PM   Virtual Visit via Video Note   I, Albertha Huger, connected with  Ashlee Fox  ([id number], [birth year]) on 02/11/24 at  3:00 PM EDT by a video-enabled telemedicine application and verified that I am speaking with the correct person using two identifiers.  Location: Patient: Virtual Visit Location Patient:  Home Provider: Virtual Visit Location Provider: Home Office   I discussed the limitations of evaluation and management by telemedicine and the availability of in person appointments. The patient expressed understanding and agreed to proceed.    History of Present Illness: Ashlee Fox is a 38 y.o. who identifies as a female who was assigned female at birth, and is being seen today for rt lower back into rt hip and leg pain. NKI. She is not pregnant at this time. Sx for several days not responding to tylenol  or ibuprofen .   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/2021   Carpal tunnel syndrome, right upper limb 12/03/2021   PTSD (post-traumatic stress disorder) 08/23/2021   Mild cannabis use disorder 08/23/2021   Hidradenitis suppurativa 12/11/2019   History of chlamydia 10/18/2017   Pap smear abnormality of cervix/human papillomavirus (HPV) positive 10/18/2017    Allergies:  Allergies  Allergen Reactions   Amoxicillin Anaphylaxis   Pineapple Itching   Latex Rash   Medications:  Current Outpatient Medications:    predniSONE  (DELTASONE ) 20 MG tablet, Take 1 tablet (20 mg total) by mouth 2 (two) times daily with a meal for 5 days., Disp: 10 tablet, Rfl: 0   cetirizine  (ZYRTEC ) 10 MG tablet, Take 1 tablet (  10 mg total) by mouth daily., Disp: 30 tablet, Rfl: 1   cholecalciferol (VITAMIN D) 1000 units tablet, Take 1 capsule by mouth daily., Disp: , Rfl:    Ferrous Sulfate  (IRON ) 325 (65 Fe) MG TABS, Take 1 tablet by mouth twice daily, Disp: 180 tablet, Rfl: 1   labetalol  (NORMODYNE ) 200 MG tablet, Take 1 tablet (200 mg total) by mouth 2 (two) times daily., Disp: 60 tablet, Rfl: 2   Multiple Vitamin (MULTIVITAMIN WITH MINERALS) TABS, Take 1  tablet by mouth daily., Disp: , Rfl:    NIFEdipine  (ADALAT  CC) 90 MG 24 hr tablet, Take 1 tablet (90 mg total) by mouth daily., Disp: 30 tablet, Rfl: 1  Observations/Objective: Patient is well-developed, well-nourished in no acute distress.  Resting comfortably  at home.  Head is normocephalic, atraumatic.  No labored breathing.  Speech is clear and coherent with logical content.  Patient is alert and oriented at baseline.    Assessment and Plan: 1. Low back pain radiating to right lower extremity (Primary)  Heat, U Cif sx persist or worsen. She is trying to get  pregnant but not late yet and will do preg test in 3 days.   Follow Up Instructions: I discussed the assessment and treatment plan with the patient. The patient was provided an opportunity to ask questions and all were answered. The patient agreed with the plan and demonstrated an understanding of the instructions.  A copy of instructions were sent to the patient via MyChart unless otherwise noted below.     The patient was advised to call back or seek an in-person evaluation if the symptoms worsen or if the condition fails to improve as anticipated.    Janeen Watson, FNP

## 2024-02-11 NOTE — Patient Instructions (Signed)

## 2024-02-12 ENCOUNTER — Telehealth: Admitting: Family Medicine

## 2024-02-12 DIAGNOSIS — M79604 Pain in right leg: Secondary | ICD-10-CM

## 2024-02-12 MED ORDER — PREDNISONE 20 MG PO TABS: 20.0000 mg | ORAL_TABLET | Freq: Two times a day (BID) | ORAL | 0 refills | Status: AC

## 2024-02-12 NOTE — Progress Notes (Signed)
 Virtual Visit Consent   Ashlee SUMNERS, you are scheduled for a virtual visit with a [facility] provider today. Just as with appointments in the office, your consent must be obtained to participate. Your consent will be active for this visit and any virtual visit you may have with one of our providers in the next 365 days. If you have a MyChart account, a copy of this consent can be sent to you electronically.  As this is a virtual visit, video technology does not allow for your provider to perform a traditional examination. This may limit your provider's ability to fully assess your condition. If your provider identifies any concerns that need to be evaluated in person or the need to arrange testing (such as labs, EKG, etc.), we will make arrangements to do so. Although advances in technology are sophisticated, we cannot ensure that it will always work on either your end or our end. If the connection with a video visit is poor, the visit may have to be switched to a telephone visit. With either a video or telephone visit, we are not always able to ensure that we have a secure connection.  By engaging in this virtual visit, you consent to the provision of healthcare and authorize for your insurance to be billed (if applicable) for the services provided during this visit. Depending on your insurance coverage, you may receive a charge related to this service.  I need to obtain your verbal consent now. Are you willing to proceed with your visit today? Ashlee Fox has provided verbal consent on 02/12/2024 for a virtual visit (video or telephone). Louvenia Roys, [place]  Date: 02/12/2024 1:34 PM   Virtual Visit via Video Note   I, Louvenia Roys, connected with  Ashlee Fox  ([id number], [birth year]) on 02/12/24 at  1:30 PM EDT by a video-enabled telemedicine application and verified that I am speaking with the correct person using two identifiers.  Location: Patient: Virtual Visit Location Patient:  Home Provider: Virtual Visit Location Provider: Home Office   I discussed the limitations of evaluation and management by telemedicine and the availability of in person appointments. The patient expressed understanding and agreed to proceed.    History of Present Illness: Ashlee Fox is a 38 y.o. who identifies as a female who was assigned female at birth, and is being seen today for c/o spoke with someone yesterday and was told she had a pinch nerve in her back.  Pt states the medication was sent to a pharmacy that was closed yesterday and today and is wanting the medication sent to a pharmacy that is open today.   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/2021   Carpal tunnel syndrome, right upper limb 12/03/2021   PTSD (post-traumatic stress disorder) 08/23/2021   Mild cannabis use disorder 08/23/2021   Hidradenitis suppurativa 12/11/2019   History of chlamydia 10/18/2017   Pap smear abnormality of cervix/human papillomavirus (HPV) positive 10/18/2017    Allergies:  Allergies  Allergen Reactions   Amoxicillin Anaphylaxis   Pineapple Itching   Latex Rash   Medications:  Current Outpatient Medications:    cetirizine  (ZYRTEC ) 10 MG tablet, Take 1 tablet (10 mg total) by mouth daily., Disp: 30 tablet, Rfl: 1  cholecalciferol (VITAMIN D) 1000 units tablet, Take 1 capsule by mouth daily., Disp: , Rfl:    Ferrous Sulfate  (IRON ) 325 (65 Fe) MG TABS, Take 1 tablet by mouth twice daily, Disp: 180 tablet, Rfl: 1   labetalol  (NORMODYNE ) 200 MG tablet, Take 1 tablet (200 mg total) by mouth 2 (two) times daily., Disp: 60 tablet, Rfl: 2   Multiple Vitamin (MULTIVITAMIN WITH MINERALS) TABS, Take 1 tablet by mouth daily., Disp: , Rfl:    NIFEdipine   (ADALAT  CC) 90 MG 24 hr tablet, Take 1 tablet (90 mg total) by mouth daily., Disp: 30 tablet, Rfl: 1   predniSONE  (DELTASONE ) 20 MG tablet, Take 1 tablet (20 mg total) by mouth 2 (two) times daily with a meal for 5 days., Disp: 10 tablet, Rfl: 0  Observations/Objective: Patient is well-developed, well-nourished in no acute distress.  Resting comfortably at home.  Head is normocephalic, atraumatic.  No labored breathing.  Speech is clear and coherent with logical content.  Patient is alert and oriented at baseline.    Assessment and Plan: 1. Low back pain radiating to right lower extremity (Primary) - predniSONE  (DELTASONE ) 20 MG tablet; Take 1 tablet (20 mg total) by mouth 2 (two) times daily with a meal for 5 days.  Dispense: 10 tablet; Refill: 0  -Medication sent to proper pharmacy.  Will not bill for this visit as this was not the patients fault.   Follow Up Instructions: I discussed the assessment and treatment plan with the patient. The patient was provided an opportunity to ask questions and all were answered. The patient agreed with the plan and demonstrated an understanding of the instructions.  A copy of instructions were sent to the patient via MyChart unless otherwise noted below.    The patient was advised to call back or seek an in-person evaluation if the symptoms worsen or if the condition fails to improve as anticipated.    Louvenia Roys, PA-C

## 2024-02-13 ENCOUNTER — Other Ambulatory Visit: Payer: Self-pay

## 2024-02-13 ENCOUNTER — Encounter: Payer: Self-pay | Admitting: Obstetrics and Gynecology

## 2024-02-13 ENCOUNTER — Ambulatory Visit: Admitting: Obstetrics and Gynecology

## 2024-02-13 ENCOUNTER — Ambulatory Visit: Payer: Self-pay

## 2024-02-13 VITALS — BP 152/92 | HR 69 | Wt 291.3 lb

## 2024-02-13 DIAGNOSIS — D6861 Antiphospholipid syndrome: Secondary | ICD-10-CM | POA: Diagnosis not present

## 2024-02-13 DIAGNOSIS — Z3169 Encounter for other general counseling and advice on procreation: Secondary | ICD-10-CM

## 2024-02-13 DIAGNOSIS — N96 Recurrent pregnancy loss: Secondary | ICD-10-CM

## 2024-02-13 MED ORDER — ASPIRIN 81 MG PO TBEC
162.0000 mg | DELAYED_RELEASE_TABLET | Freq: Every day | ORAL | 5 refills | Status: AC
  Filled 2024-02-13: qty 120, 60d supply, fill #0
  Filled 2024-04-08: qty 120, 60d supply, fill #1
  Filled 2024-06-07: qty 120, 60d supply, fill #2
  Filled 2024-08-06: qty 120, 60d supply, fill #3
  Filled 2024-10-03: qty 120, 60d supply, fill #4

## 2024-02-13 NOTE — Patient Instructions (Signed)
 Dr. Marien Short recommends the following: Vitamin C 1000 mg daily, Vitamin E 400 mg daily, Zinc daily, Folic acid 400 mcg daily.

## 2024-02-13 NOTE — Progress Notes (Signed)
 GYNECOLOGY OFFICE VISIT NOTE  History:  Ashlee Fox is a 38 y.o. G4P0040 here today for follow up from lab testing.    Notes cycles 28 days in general +/- 2-3 days. She also notes cycles last 3 days - heavy during that time but not as bad as they were. Cycles improved with weight loss.   She is s/p Heme consult for possible APS. I reviewed and read their note.     Past Medical History:  Diagnosis Date   Anemia    Complication of anesthesia    Hx MRSA infection    Hypertension    Morbid obesity (HCC) 10/27/2017    Past Surgical History:  Procedure Laterality Date   CARPAL TUNNEL RELEASE Right 12/15/2021   Procedure: RIGHT CARPAL TUNNEL RELEASE;  Surgeon: Marilyn Shropshire, MD;  Location: [facility];  Service: Orthopedics;  Laterality: Right;   TONSILLECTOMY     ULNAR NERVE TRANSPOSITION Right 12/15/2021   Procedure: RIGHT ULNAR NERVE DECOMPRESSION/TRANSPOSITION;  Surgeon: Marilyn Shropshire, MD;  Location: [facility];  Service: Orthopedics;  Laterality: Right;    The following portions of the patient's history were reviewed and updated as appropriate: allergies, current medications, past family history, past medical history, past social history, past surgical history and problem list.   Health Maintenance:   Diagnosis  Date Value Ref Range Status  01/09/2023   Final   - Negative for Intraepithelial Lesions or Malignancy (NILM)  01/09/2023 - Benign reactive/reparative changes  Final    Review of Systems:  Pertinent items noted in HPI and remainder of comprehensive ROS otherwise negative.  Physical Exam:  BP (!) 152/92   Pulse 69   Wt 291 lb 4.8 oz (132.1 kg)   LMP 02/12/2024   BMI 41.80 kg/m  CONSTITUTIONAL: Well-developed, well-nourished female in no acute distress.  HEENT:  Normocephalic, atraumatic. External right and left ear normal. No scleral icterus.  NECK: Normal range of motion, supple, no masses noted on observation SKIN: No  rash noted. Not diaphoretic. No erythema. No pallor. MUSCULOSKELETAL: Normal range of motion. No edema noted. NEUROLOGIC: Alert and oriented to person, place, and time. Normal muscle tone coordination. No cranial nerve deficit noted. PSYCHIATRIC: Normal mood and affect. Normal behavior. Normal judgment and thought content.  PELVIC: Deferred  Labs and Imaging Results for orders placed or performed in visit on 02/08/24 (from the past week)  Retic Panel   Collection Time: 02/08/24  2:32 PM  Result Value Ref Range   Retic Ct Pct 2.2 0.4 - 3.1 %   RBC. 3.38 (L) 3.87 - 5.11 MIL/uL   Retic Count, Absolute 74.7 19.0 - 186.0 K/uL   Immature Retic Fract 9.5 2.3 - 15.9 %   Reticulocyte Hemoglobin 32.5 >27.9 pg  Iron  and Iron  Binding Capacity ([facility] only)   Collection Time: 02/08/24  2:33 PM  Result Value Ref Range   Iron  56 28 - 170 ug/dL   TIBC 409 811 - 914 ug/dL   Saturation Ratios 22 10.4 - 31.8 %   UIBC 202 148 - 442 ug/dL  Ferritin   Collection Time: 02/08/24  2:33 PM  Result Value Ref Range   Ferritin 139 11 - 307 ng/mL  CMP ([facility]cer Center only)   Collection Time: 02/08/24  2:33 PM  Result Value Ref Range   Sodium 140 135 - 145 mmol/L   Potassium 3.6 3.5 - 5.1 mmol/L   Chloride 106 98 - 111 mmol/L   CO2 28 22 - 32  mmol/L   Glucose, Bld 92 70 - 99 mg/dL   BUN 11 6 - 20 mg/dL   Creatinine 6.21 3.08 - 1.00 mg/dL   Calcium 8.7 (L) 8.9 - 10.3 mg/dL   Total Protein 7.6 6.5 - 8.1 g/dL   Albumin 3.8 3.5 - 5.0 g/dL   AST 13 (L) 15 - 41 U/L   ALT 19 0 - 44 U/L   Alkaline Phosphatase 58 38 - 126 U/L   Total Bilirubin 0.2 0.0 - 1.2 mg/dL   GFR, Estimated >65 >78 mL/min   Anion gap 6 5 - 15  CBC with Differential (Cancer Center Only)   Collection Time: 02/08/24  2:33 PM  Result Value Ref Range   WBC Count 8.4 4.0 - 10.5 K/uL   RBC 3.36 (L) 3.87 - 5.11 MIL/uL   Hemoglobin 10.6 (L) 12.0 - 15.0 g/dL   HCT 46.9 (L) 62.9 - 52.8 %   MCV 95.5 80.0 - 100.0 fL   MCH 31.5 26.0 - 34.0  pg   MCHC 33.0 30.0 - 36.0 g/dL   RDW 41.3 24.4 - 01.0 %   Platelet Count 251 150 - 400 K/uL   nRBC 0.0 0.0 - 0.2 %   Neutrophils Relative % 63 %   Neutro Abs 5.4 1.7 - 7.7 K/uL   Lymphocytes Relative 25 %   Lymphs Abs 2.1 0.7 - 4.0 K/uL   Monocytes Relative 9 %   Monocytes Absolute 0.7 0.1 - 1.0 K/uL   Eosinophils Relative 2 %   Eosinophils Absolute 0.2 0.0 - 0.5 K/uL   Basophils Relative 1 %   Basophils Absolute 0.0 0.0 - 0.1 K/uL   Immature Granulocytes 0 %   Abs Immature Granulocytes 0.02 0.00 - 0.07 K/uL   No results found.  Assessment and Plan:  1. Recurrent pregnancy loss (Primary) See below.   2. Infertility counseling Discussed options for management. Cycles are regular.  - Reviewed three main factors for fertility - ovulation, normal sperm and tubal patency - Ovulation: Reviewed AMH level.  - Sperm: Slip given for semen analysis - Tubal Patency: If all other testing is normal, we can check tubal patency via HSG. Reviewed what the procedure entails. Once SA is back, we will do HSG referral.  - Reviewed factors that most behaviors do and don't contribute to infertility - Encouraged continued TTC during this time. Reviewed conception and timing of ovulation and timing of "trying" - Discussed letrozole in the setting of infertility. Discussed minimal effectiveness when not used in the setting of anovulation unless when used in combination with IUI.  - We have discussed the risks i.e. multiples (10% chance) and the risks that come with it i.e. PTL, C-section, GDM and PreE, etc. - Discussed take on cycle d5-9 vs d3-7; TTC every other day through days 10-16. - If unsuccessful after 3 rounds, I recommend appt with REI.   3. Possible APS (antiphospholipid syndrome) (HCC) For fertility purposes, recommend ldASA 162 mg daily  She has seen hematology who felt she did not meet diagnostic criteria, however given RPL and indeterminate AB range, would advise treatment as listed  above. However since indeterminate, AB levels would not do full Lovenox level of treatment unless something changes with her history.   4. Heavy periods Discussed TXA vs Motrin . She will try motrin  when periods are heavy.    Meds ordered this encounter  Medications   aspirin EC 81 MG tablet    Sig: Take 2 tablets (162 mg total) by  mouth daily. Swallow whole.    Dispense:  120 tablet    Refill:  5     Routine preventative health maintenance measures emphasized. Please refer to After Visit Summary for other counseling recommendations.   Return if symptoms worsen or fail to improve.  Lacey Pian, MD, FACOG Obstetrician & Gynecologist, [facility] for [facility], [facility]

## 2024-02-19 ENCOUNTER — Other Ambulatory Visit: Payer: Self-pay

## 2024-02-19 ENCOUNTER — Emergency Department (HOSPITAL_COMMUNITY)

## 2024-02-19 ENCOUNTER — Emergency Department (HOSPITAL_COMMUNITY)
Admission: EM | Admit: 2024-02-19 | Discharge: 2024-02-19 | Disposition: A | Discharge status: Home / Self Care | Attending: Emergency Medicine | Admitting: Emergency Medicine

## 2024-02-19 ENCOUNTER — Encounter (HOSPITAL_COMMUNITY): Payer: Self-pay | Admitting: *Deleted

## 2024-02-19 DIAGNOSIS — Z91040 Latex allergy status: Secondary | ICD-10-CM | POA: Insufficient documentation

## 2024-02-19 DIAGNOSIS — I10 Essential (primary) hypertension: Secondary | ICD-10-CM | POA: Diagnosis not present

## 2024-02-19 DIAGNOSIS — R109 Unspecified abdominal pain: Secondary | ICD-10-CM | POA: Diagnosis not present

## 2024-02-19 DIAGNOSIS — Z7982 Long term (current) use of aspirin: Secondary | ICD-10-CM | POA: Insufficient documentation

## 2024-02-19 DIAGNOSIS — M5441 Lumbago with sciatica, right side: Secondary | ICD-10-CM | POA: Insufficient documentation

## 2024-02-19 LAB — COMPREHENSIVE METABOLIC PANEL WITH GFR
ALT: 24 U/L (ref 0–44)
AST: 14 U/L — ABNORMAL LOW (ref 15–41)
Albumin: 3.1 g/dL — ABNORMAL LOW (ref 3.5–5.0)
Alkaline Phosphatase: 53 U/L (ref 38–126)
Anion gap: 10 (ref 5–15)
BUN: 13 mg/dL (ref 6–20)
CO2: 23 mmol/L (ref 22–32)
Calcium: 8.9 mg/dL (ref 8.9–10.3)
Chloride: 104 mmol/L (ref 98–111)
Creatinine, Ser: 0.69 mg/dL (ref 0.44–1.00)
GFR, Estimated: >60 mL/min (ref >60)
Glucose, Bld: 110 mg/dL — ABNORMAL HIGH (ref 70–99)
Potassium: 3.6 mmol/L (ref 3.5–5.1)
Sodium: 137 mmol/L (ref 135–145)
Total Bilirubin: 0.3 mg/dL (ref 0.0–1.2)
Total Protein: 7.2 g/dL (ref 6.5–8.1)

## 2024-02-19 LAB — URINALYSIS, ROUTINE W REFLEX MICROSCOPIC
Bilirubin Urine: NEGATIVE
Glucose, UA: NEGATIVE mg/dL
Ketones, ur: NEGATIVE mg/dL
Leukocytes,Ua: NEGATIVE
Nitrite: NEGATIVE
Protein, ur: NEGATIVE mg/dL
Specific Gravity, Urine: 1.005 (ref 1.005–1.030)
pH: 5 (ref 5.0–8.0)

## 2024-02-19 LAB — CBC
HCT: 35 % — ABNORMAL LOW (ref 36.0–46.0)
Hemoglobin: 11.2 g/dL — ABNORMAL LOW (ref 12.0–15.0)
MCH: 32 pg (ref 26.0–34.0)
MCHC: 32 g/dL (ref 30.0–36.0)
MCV: 100 fL (ref 80.0–100.0)
Platelets: 279 10*3/uL (ref 150–400)
RBC: 3.5 MIL/uL — ABNORMAL LOW (ref 3.87–5.11)
RDW: 13 % (ref 11.5–15.5)
WBC: 10.9 10*3/uL — ABNORMAL HIGH (ref 4.0–10.5)
nRBC: 0 % (ref 0.0–0.2)

## 2024-02-19 LAB — LIPASE, BLOOD: Lipase: 32 U/L (ref 11–51)

## 2024-02-19 LAB — HCG, SERUM, QUALITATIVE: Preg, Serum: NEGATIVE

## 2024-02-19 MED ORDER — DEXAMETHASONE SODIUM PHOSPHATE 10 MG/ML IJ SOLN
10.0000 mg | Freq: Once | INTRAMUSCULAR | Status: AC
  Administered 2024-02-19: 10 mg via INTRAVENOUS
  Filled 2024-02-19: qty 1

## 2024-02-19 MED ORDER — KETOROLAC TROMETHAMINE 15 MG/ML IJ SOLN
15.0000 mg | Freq: Once | INTRAMUSCULAR | Status: AC
  Administered 2024-02-19: 15 mg via INTRAVENOUS
  Filled 2024-02-19: qty 1

## 2024-02-19 MED ORDER — METHYLPREDNISOLONE 4 MG PO TBPK
ORAL_TABLET | ORAL | 0 refills | Status: DC
  Filled 2024-02-19: qty 21, 6d supply, fill #0

## 2024-02-19 MED ORDER — NAPROXEN 500 MG PO TABS
500.0000 mg | ORAL_TABLET | Freq: Two times a day (BID) | ORAL | 0 refills | Status: DC
  Filled 2024-02-19: qty 30, 15d supply, fill #0

## 2024-02-19 NOTE — Discharge Instructions (Signed)
 Your presentation this evening was most consistent with low back pain with an irritated nerve.  I have sent prescriptions for a steroid Dosepak and anti-inflammatory medication.  You may reach out to your primary care provider if you wish to see if these medications would interfere with your attempts to become pregnant.  Please follow-up with primary care for further discussion of your back pain.  If you develop any life-threatening symptoms return to the emergency department.

## 2024-02-19 NOTE — ED Provider Notes (Signed)
 [facility] EMERGENCY DEPARTMENT AT [facility] Provider Note   CSN: [id number] Arrival date & time: 02/19/24  0149     History  Chief Complaint  Patient presents with   Flank Pain    Ashlee Fox is a 38 y.o. female.  Patient presents to the emergency department complaining of approximately 1 week of right sided flank pain.  She states she was initially seen by telehealth provider who diagnosed her with a likely pinched nerve and prescribed a course of prednisone .  She states she has completed the course of prednisone  and continues to have significant right sided flank pain.  She does endorse occasional pain shooting down her right leg as well.  She denies nausea, vomiting, urinary symptoms, abdominal pain at this time.  Past medical history significant for hidradenitis suppurativa, PTSD, hypertension, obesity   Flank Pain       Home Medications Prior to Admission medications   Medication Sig Start Date End Date Taking? Authorizing Provider  methylPREDNISolone (MEDROL DOSEPAK) 4 MG TBPK tablet Take as directed per package instructions 02/19/24  Yes Elisa Guest, PA-C  naproxen  (NAPROSYN ) 500 MG tablet Take 1 tablet (500 mg total) by mouth 2 (two) times daily. 02/19/24  Yes Delories Fetter  aspirin  EC 81 MG tablet Take 2 tablets (162 mg total) by mouth daily. Swallow whole. 02/13/24   Lacey Pian, MD  cetirizine  (ZYRTEC ) 10 MG tablet Take 1 tablet (10 mg total) by mouth daily. 10/17/22   Newlin, Enobong, MD  cholecalciferol (VITAMIN D) 1000 units tablet Take 1 capsule by mouth daily.    [provider]  Ferrous Sulfate  (IRON ) 325 (65 Fe) MG TABS Take 1 tablet by mouth twice daily 01/29/24   Newlin, Enobong, MD  labetalol  (NORMODYNE ) 200 MG tablet Take 1 tablet (200 mg total) by mouth 2 (two) times daily. 12/20/23   Newlin, Enobong, MD  Multiple Vitamin (MULTIVITAMIN WITH MINERALS) TABS Take 1 tablet by mouth daily.    [provider]  NIFEdipine   (ADALAT  CC) 90 MG 24 hr tablet Take 1 tablet (90 mg total) by mouth daily. 01/03/24   Newlin, Enobong, MD      Allergies    Amoxicillin, Pineapple, and Latex    Review of Systems   Review of Systems  Genitourinary:  Positive for flank pain.    Physical Exam Updated Vital Signs BP (!) 141/98   Pulse (!) 50   Temp 97.7 F (36.5 C)   Resp 20   Ht 5\' 10"  (1.778 m)   Wt 132.1 kg   LMP 02/12/2024   SpO2 98%   BMI 41.79 kg/m  Physical Exam Vitals and nursing note reviewed.  Constitutional:      General: She is not in acute distress.    Appearance: She is well-developed. She is obese.  HENT:     Head: Normocephalic and atraumatic.  Eyes:     Conjunctiva/sclera: Conjunctivae normal.  Cardiovascular:     Rate and Rhythm: Normal rate and regular rhythm.  Pulmonary:     Effort: Pulmonary effort is normal. No respiratory distress.     Breath sounds: Normal breath sounds.  Abdominal:     Palpations: Abdomen is soft.     Tenderness: There is no abdominal tenderness. There is right CVA tenderness.  Musculoskeletal:        General: No swelling.     Cervical back: Neck supple.  Skin:    General: Skin is warm and dry.  Capillary Refill: Capillary refill takes less than 2 seconds.  Neurological:     Mental Status: She is alert.  Psychiatric:        Mood and Affect: Mood normal.     ED Results / Procedures / Treatments   Labs (all labs ordered are listed, but only abnormal results are displayed) Labs Reviewed  COMPREHENSIVE METABOLIC PANEL WITH GFR - Abnormal; Notable for the following components:      Result Value   Glucose, Bld 110 (*)    Albumin 3.1 (*)    AST 14 (*)    All other components within normal limits  CBC - Abnormal; Notable for the following components:   WBC 10.9 (*)    RBC 3.50 (*)    Hemoglobin 11.2 (*)    HCT 35.0 (*)    All other components within normal limits  URINALYSIS, ROUTINE W REFLEX MICROSCOPIC - Abnormal; Notable for the following  components:   Hgb urine dipstick SMALL (*)    Bacteria, UA RARE (*)    All other components within normal limits  LIPASE, BLOOD  HCG, SERUM, QUALITATIVE    EKG None  Radiology CT Renal Stone Study Result Date: 02/19/2024 CLINICAL DATA:  Flank pain, abdominal pain EXAM: CT ABDOMEN AND PELVIS WITHOUT CONTRAST TECHNIQUE: Multidetector CT imaging of the abdomen and pelvis was performed following the standard protocol without IV contrast. RADIATION DOSE REDUCTION: This exam was performed according to the departmental dose-optimization program which includes automated exposure control, adjustment of the mA and/or kV according to patient size and/or use of iterative reconstruction technique. COMPARISON:  None Available. FINDINGS: Lower chest: No acute abnormality. Hepatobiliary: No focal liver abnormality is seen. No gallstones, gallbladder wall thickening, or biliary dilatation. Pancreas: Unremarkable Spleen: Unremarkable Adrenals/Urinary Tract: Adrenal glands are unremarkable. Kidneys are normal, without renal calculi, focal lesion, or hydronephrosis. Bladder is unremarkable. Stomach/Bowel: Stomach is within normal limits. Appendix appears normal. No evidence of bowel wall thickening, distention, or inflammatory changes. Vascular/Lymphatic: No significant vascular findings are present. No enlarged abdominal or pelvic lymph nodes. Reproductive: Uterus and bilateral adnexa are unremarkable. Other: No abdominal wall hernia or abnormality. No abdominopelvic ascites. Musculoskeletal: No acute bone abnormality. No lytic or blastic bone lesion. Osseous structures are age appropriate. IMPRESSION: 1. No acute intra-abdominal pathology identified. No urolithiasis. Normal Appendix. Electronically Signed   By: Worthy Heads M.D.   On: 02/19/2024 03:51    Procedures Procedures    Medications Ordered in ED Medications  ketorolac  (TORADOL ) 15 MG/ML injection 15 mg (15 mg Intravenous Given 02/19/24 0304)   dexamethasone  (DECADRON ) injection 10 mg (10 mg Intravenous Given 02/19/24 0405)    ED Course/ Medical Decision Making/ A&P                                 Medical Decision Making Amount and/or Complexity of Data Reviewed Labs: ordered. Radiology: ordered.  Risk Prescription drug management.   This patient presents to the ED for concern of flank pain, this involves an extensive number of treatment options, and is a complaint that carries with it a high risk of complications and morbidity.  The differential diagnosis includes nephrolithiasis, hydronephrosis, pyelonephritis, musculoskeletal pain including low back pain with radiculopathy and others   Co morbidities / Chronic conditions that complicate the patient evaluation  Obesity, hypertension   Additional history obtained:  Additional history obtained from EMR External records from outside source obtained and reviewed including family  medicine note   Lab Tests:  I Ordered, and personally interpreted labs.  The pertinent results include: UA with small hemoglobin, WBC 10,900   Imaging Studies ordered:  I ordered imaging studies including CT renal stone study I independently visualized and interpreted imaging which showed no acute findings I agree with the radiologist interpretation    Problem List / ED Course / Critical interventions / Medication management   I ordered medication including Toradol , decadron  Reevaluation of the patient after these medicines showed that the patient improved I have reviewed the patients home medicines and have made adjustments as needed   Social Determinants of Health:  Patient has Medicaid for her primary health insurance type   Test / Admission - Considered:  Labs and CT grossly unremarkable.  No signs of any acute intra-abdominal etiology for patient's symptoms.  No signs of nephrolithiasis, hydronephrosis, pyelonephritis.  With right leg involvement I do believe this is  likely a low back pain with radiculopathy.  Patient treated with Toradol  and Decadron .  I plan to send a prescription for Medrol Dosepak Naprosyn  to the patient's pharmacy.  She plans to follow-up with her primary care provider and also plans to follow-up with her primary care provider for advice of whether or not to take medication if she is planning to try to get pregnant.  No indication for further emergent workup or admission.  Patient stable for discharge home.         Final Clinical Impression(s) / ED Diagnoses Final diagnoses:  Acute right-sided low back pain with right-sided sciatica    Rx / DC Orders ED Discharge Orders          Ordered    methylPREDNISolone (MEDROL DOSEPAK) 4 MG TBPK tablet        02/19/24 0422    naproxen  (NAPROSYN ) 500 MG tablet  2 times daily        02/19/24 0422              Delories Fetter 02/19/24 Resa Cass, MD 02/19/24 0500

## 2024-02-19 NOTE — ED Triage Notes (Signed)
 The pt has had rt flank pain since may 25th  urinary frequency   no blood in urine  may 30

## 2024-02-20 ENCOUNTER — Telehealth: Payer: Self-pay

## 2024-02-20 NOTE — Telephone Encounter (Signed)
 VM left stating referral for partner's sperm analysis was sent to [facility] location. Needs this to be resent to [facility] location.

## 2024-02-21 NOTE — Telephone Encounter (Signed)
 Resent Fertility Referral to [facility] office. Fax Confirmation received. Patient was informed via phone.

## 2024-02-22 ENCOUNTER — Encounter: Payer: Self-pay | Admitting: General Practice

## 2024-02-26 ENCOUNTER — Other Ambulatory Visit: Payer: Self-pay | Admitting: Family Medicine

## 2024-02-26 ENCOUNTER — Other Ambulatory Visit: Payer: Self-pay | Admitting: Licensed Clinical Social Worker

## 2024-02-26 ENCOUNTER — Other Ambulatory Visit: Payer: Self-pay

## 2024-02-26 MED ORDER — NIFEDIPINE ER 90 MG PO TB24
90.0000 mg | ORAL_TABLET | Freq: Every day | ORAL | 1 refills | Status: DC
  Filled 2024-02-26: qty 30, 30d supply, fill #0

## 2024-02-26 NOTE — Patient Instructions (Signed)
 Visit Information  Thank you for taking time to visit with me today. Please don't hesitate to contact me if I can be of assistance to you before our next scheduled appointment.  Your next care management appointment is no further scheduled appointments.   Patient declines further follow up from program support  Please call the care guide team at [phone] if you need to  reschedule or re-enroll in program support  Please call go to [facility] [address] [place] [phone]) if you are experiencing a Mental Health or Behavioral Health Crisis or need someone to talk to.    Alexandria Angel  MSW, LCSW [place] Licensed Clinical Social Worker Direct Dial:  [phone] Fax:  [phone] Website:  [email or web address]

## 2024-02-26 NOTE — Patient Outreach (Signed)
 Complex Care Management   Visit Note  02/26/2024  Name:  Ashlee Fox MRN: [id number] DOB: Feb 06, 1986  Situation: Referral received for Complex Care Management related to financial challenges I obtained verbal consent from Patient.  Visit completed with patient  on the phone  Background:   Past Medical History:  Diagnosis Date   Anemia    Complication of anesthesia    Hx MRSA infection    Hypertension    Morbid obesity (HCC) 10/27/2017    Assessment:  Patient declined to answer assessment questions and opts to disenroll from CCM program today Patient Reported Symptoms: pain issues      02/13/2024    2:16 PM  Depression screen PHQ 2/9  Decreased Interest 1  Down, Depressed, Hopeless 0  PHQ - 2 Score 1  Altered sleeping 1  Tired, decreased energy 1  Change in appetite 0  Feeling bad or failure about yourself  0  Trouble concentrating 0  Moving slowly or fidgety/restless 0  Suicidal thoughts 0  PHQ-9 Score 3    There were no vitals filed for this visit.  Medications Reviewed Today   Medications were not reviewed in this encounter     Recommendation:   Contact PCP as needed for medical needs and to re-enroll in [facility] CCM program support   Follow Up Plan:   Patient declines further program support at this time   Alexandria Angel  MSW, LCSW [place] Licensed Clinical Social Worker Direct Dial:  [phone] Fax:  [phone] Website:  [email or web address]

## 2024-02-27 ENCOUNTER — Other Ambulatory Visit: Payer: Self-pay

## 2024-02-29 DIAGNOSIS — Z419 Encounter for procedure for purposes other than remedying health state, unspecified: Secondary | ICD-10-CM | POA: Diagnosis not present

## 2024-03-05 ENCOUNTER — Ambulatory Visit: Attending: Family Medicine | Admitting: Family Medicine

## 2024-03-05 ENCOUNTER — Encounter: Payer: Self-pay | Admitting: Family Medicine

## 2024-03-05 ENCOUNTER — Other Ambulatory Visit: Payer: Self-pay

## 2024-03-05 VITALS — BP 117/77 | HR 85 | Ht 70.0 in | Wt 290.8 lb

## 2024-03-05 DIAGNOSIS — E042 Nontoxic multinodular goiter: Secondary | ICD-10-CM

## 2024-03-05 DIAGNOSIS — Z636 Dependent relative needing care at home: Secondary | ICD-10-CM | POA: Diagnosis not present

## 2024-03-05 DIAGNOSIS — N96 Recurrent pregnancy loss: Secondary | ICD-10-CM

## 2024-03-05 DIAGNOSIS — Z131 Encounter for screening for diabetes mellitus: Secondary | ICD-10-CM

## 2024-03-05 DIAGNOSIS — D508 Other iron deficiency anemias: Secondary | ICD-10-CM | POA: Diagnosis not present

## 2024-03-05 DIAGNOSIS — I10 Essential (primary) hypertension: Secondary | ICD-10-CM | POA: Diagnosis not present

## 2024-03-05 LAB — POCT GLYCOSYLATED HEMOGLOBIN (HGB A1C): Hemoglobin A1C: 4.6 % (ref 4.0–5.6)

## 2024-03-05 MED ORDER — NIFEDIPINE ER 90 MG PO TB24
90.0000 mg | ORAL_TABLET | Freq: Every day | ORAL | 1 refills | Status: DC
  Filled 2024-03-05 – 2024-03-29 (×2): qty 90, 90d supply, fill #0
  Filled 2024-06-23: qty 90, 90d supply, fill #1
  Filled 2024-06-26: qty 30, 30d supply, fill #1
  Filled 2024-07-22: qty 30, 30d supply, fill #2
  Filled 2024-08-21: qty 30, 30d supply, fill #3

## 2024-03-05 MED ORDER — LABETALOL HCL 200 MG PO TABS
200.0000 mg | ORAL_TABLET | Freq: Two times a day (BID) | ORAL | 1 refills | Status: DC
  Filled 2024-03-05 – 2024-03-14 (×2): qty 180, 90d supply, fill #0
  Filled 2024-06-07: qty 180, 90d supply, fill #1

## 2024-03-05 NOTE — Progress Notes (Signed)
 Subjective:  Patient ID: Ashlee Fox, female    DOB: 26-Oct-1985  Age: 38 y.o. MRN: [id number]  CC: Hypertension     Discussed the use of AI scribe software for clinical note transcription with the patient, who gave verbal consent to proceed.  History of Present Illness Ashlee Fox is a 38 year old female with hypertension and anemia who presents for follow-up on her blood pressure and iron  levels.  Her blood pressure has improved with labetalol  200 mg twice daily and nifedipine  90 mg once daily.  She has been inconsistent with home blood pressure monitoring due to focusing on her mother's health.  Had labs had revealed the possibility of antiphospholipid syndrome for which she had referred her to hematology but the hematology notes she did not meet criteria for this. Her hematologist advised reducing iron  intake from 325 mg twice daily to once daily, which has improved constipation. Despite a hemoglobin level of 11.2, she experiences heavy periods and uses Motrin  for relief. She takes prenatal vitamins with folic acid. She has had a visit with OB/GYN and aspirin  was initiated she is being worked up for recurrent pregnancy losses.  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 She is seeking assistance to manage her caregiving responsibilities.    Past Medical History:  Diagnosis Date   Anemia    Complication of anesthesia    Hx MRSA infection    Hypertension    Morbid obesity (HCC) 10/27/2017    Past Surgical History:  Procedure Laterality Date   CARPAL TUNNEL RELEASE Right 12/15/2021   Procedure: RIGHT CARPAL TUNNEL RELEASE;  Surgeon: Marilyn Shropshire, MD;  Location: [facility];  Service: Orthopedics;  Laterality: Right;   TONSILLECTOMY     ULNAR NERVE TRANSPOSITION Right 12/15/2021   Procedure: RIGHT ULNAR NERVE DECOMPRESSION/TRANSPOSITION;   Surgeon: Marilyn Shropshire, MD;  Location: [facility];  Service: Orthopedics;  Laterality: Right;    Family History  Problem Relation Age of Onset   Diabetes Maternal Grandmother    Hypertension Maternal Grandmother    Breast cancer Maternal Uncle    Diabetes Maternal Uncle     Social History   Socioeconomic History   Marital status: Single    Spouse name: Not on file   Number of children: 0   Years of education: Not on file   Highest education level: Associate degree: occupational, Scientist, product/process development, or vocational program  Occupational History   Not on file  Tobacco Use   Smoking status: Never   Smokeless tobacco: Never  Vaping Use   Vaping status: Never Used  Substance and Sexual Activity   Alcohol use: No   Drug use: Yes    Types: Marijuana    Comment: 'avid user'   Sexual activity: Yes    Birth control/protection: None  Other Topics Concern   Not on file  Social History Narrative   Not on file   Social Drivers of Health   Financial Resource Strain: Medium Risk (03/04/2024)   Overall Financial Resource Strain (CARDIA)    Difficulty of Paying Living Expenses: Somewhat hard  Food Insecurity: Food Insecurity Present (03/04/2024)   Hunger Vital Sign    Worried About Running Out of Food in the Last Year: Sometimes true    Ran Out of Food in the Last Year: Never true  Transportation Needs: No Transportation Needs (03/04/2024)   PRAPARE -  Administrator, Civil Service (Medical): No    Lack of Transportation (Non-Medical): No  Physical Activity: Inactive (03/04/2024)   Exercise Vital Sign    Days of Exercise per Week: 0 days    Minutes of Exercise per Session: Not on file  Stress: No Stress Concern Present (03/04/2024)   [employer] of Occupational Health - Occupational Stress Questionnaire    Feeling of Stress: Only a little  Recent Concern: Stress - Stress Concern Present (01/10/2024)   [employer] of Occupational Health - Occupational  Stress Questionnaire    Feeling of Stress : To some extent  Social Connections: Socially Isolated (03/04/2024)   Social Connection and Isolation Panel    Frequency of Communication with Friends and Family: More than three times a week    Frequency of Social Gatherings with Friends and Family: More than three times a week    Attends Religious Services: Never    Database administrator or Organizations: No    Attends Engineer, structural: Not on file    Marital Status: Never married    Allergies  Allergen Reactions   Amoxicillin Anaphylaxis   Pineapple Itching   Latex Rash    Outpatient Medications Prior to Visit  Medication Sig Dispense Refill   aspirin  EC 81 MG tablet Take 2 tablets (162 mg total) by mouth daily. Swallow whole. 120 tablet 5   cholecalciferol (VITAMIN D) 1000 units tablet Take 1 capsule by mouth daily.     Ferrous Sulfate  (IRON ) 325 (65 Fe) MG TABS Take 1 tablet by mouth twice daily 180 tablet 1   Multiple Vitamin (MULTIVITAMIN WITH MINERALS) TABS Take 1 tablet by mouth daily.     naproxen  (NAPROSYN ) 500 MG tablet Take 1 tablet (500 mg total) by mouth 2 (two) times daily. 30 tablet 0   labetalol  (NORMODYNE ) 200 MG tablet Take 1 tablet (200 mg total) by mouth 2 (two) times daily. 60 tablet 2   NIFEdipine  (ADALAT  CC) 90 MG 24 hr tablet Take 1 tablet (90 mg total) by mouth daily. 30 tablet 1   cetirizine  (ZYRTEC ) 10 MG tablet Take 1 tablet (10 mg total) by mouth daily. (Patient not taking: Reported on 03/05/2024) 30 tablet 1   methylPREDNISolone  (MEDROL  DOSEPAK) 4 MG TBPK tablet Take as directed per package instructions (Patient not taking: Reported on 03/05/2024) 21 tablet 0   No facility-administered medications prior to visit.     ROS Review of Systems  Constitutional:  Negative for activity change and appetite change.  HENT:  Negative for sinus pressure and sore throat.   Respiratory:  Negative for chest tightness, shortness of breath and wheezing.    Cardiovascular:  Negative for chest pain and palpitations.  Gastrointestinal:  Negative for abdominal distention, abdominal pain and constipation.  Genitourinary: Negative.   Musculoskeletal: Negative.   Psychiatric/Behavioral:  Negative for behavioral problems and dysphoric mood.     Objective:  BP 117/77   Pulse 85   Ht 5' 10 (1.778 m)   Wt 290 lb 12.8 oz (131.9 kg)   LMP 02/12/2024   SpO2 99%   BMI 41.73 kg/m      03/05/2024   11:09 AM 02/19/2024    3:30 AM 02/19/2024    2:08 AM  BP/Weight  Systolic BP 117 141 140  Diastolic BP 77 98 85  Wt. (Lbs) 290.8    BMI 41.73 kg/m2        Physical Exam Constitutional:      Appearance: She  is well-developed.   Cardiovascular:     Rate and Rhythm: Normal rate.     Heart sounds: Normal heart sounds. No murmur heard. Pulmonary:     Effort: Pulmonary effort is normal.     Breath sounds: Normal breath sounds. No wheezing or rales.  Chest:     Chest wall: No tenderness.  Abdominal:     General: Bowel sounds are normal. There is no distension.     Palpations: Abdomen is soft. There is no mass.     Tenderness: There is no abdominal tenderness.   Musculoskeletal:        General: Normal range of motion.     Right lower leg: No edema.     Left lower leg: No edema.   Neurological:     Mental Status: She is alert and oriented to person, place, and time.   Psychiatric:        Mood and Affect: Mood normal.        Latest Ref Rng & Units 02/19/2024    2:14 AM 02/08/2024    2:33 PM 01/03/2024   11:23 AM  CMP  Glucose 70 - 99 mg/dL 956  92  81   BUN 6 - 20 mg/dL 13  11  8    Creatinine 0.44 - 1.00 mg/dL 2.13  0.86  5.78   Sodium 135 - 145 mmol/L 137  140  141   Potassium 3.5 - 5.1 mmol/L 3.6  3.6  3.8   Chloride 98 - 111 mmol/L 104  106  104   CO2 22 - 32 mmol/L 23  28  21    Calcium 8.9 - 10.3 mg/dL 8.9  8.7  9.1   Total Protein 6.5 - 8.1 g/dL 7.2  7.6  7.4   Total Bilirubin 0.0 - 1.2 mg/dL 0.3  0.2  0.5   Alkaline Phos 38  - 126 U/L 53  58  69   AST 15 - 41 U/L 14  13  14    ALT 0 - 44 U/L 24  19  16      Lipid Panel     Component Value Date/Time   CHOL 146 01/03/2024 1123   TRIG 84 01/03/2024 1123   HDL 37 (L) 01/03/2024 1123   CHOLHDL 4.2 04/28/2021 0851   LDLCALC 93 01/03/2024 1123    CBC    Component Value Date/Time   WBC 10.9 (H) 02/19/2024 0214   RBC 3.50 (L) 02/19/2024 0214   HGB 11.2 (L) 02/19/2024 0214   HGB 10.6 (L) 02/08/2024 1433   HGB 12.9 01/03/2024 1123   HCT 35.0 (L) 02/19/2024 0214   HCT 38.3 01/03/2024 1123   PLT 279 02/19/2024 0214   PLT 251 02/08/2024 1433   PLT 275 01/03/2024 1123   MCV 100.0 02/19/2024 0214   MCV 94 01/03/2024 1123   MCH 32.0 02/19/2024 0214   MCHC 32.0 02/19/2024 0214   RDW 13.0 02/19/2024 0214   RDW 11.5 (L) 01/03/2024 1123   LYMPHSABS 2.1 02/08/2024 1433   LYMPHSABS 2.4 01/03/2024 1123   MONOABS 0.7 02/08/2024 1433   EOSABS 0.2 02/08/2024 1433   EOSABS 0.2 01/03/2024 1123   BASOSABS 0.0 02/08/2024 1433   BASOSABS 0.0 01/03/2024 1123    Lab Results  Component Value Date   HGBA1C 4.6 03/05/2024      1. Essential hypertension (Primary) Controlled Continue current regimen Advised to also check blood pressures at home and communicate with me if blood pressures are elevated Counseled on blood pressure goal  of less than 130/80, low-sodium, DASH diet, medication compliance, 150 minutes of moderate intensity exercise per week. Discussed medication compliance, adverse effects. - labetalol  (NORMODYNE ) 200 MG tablet; Take 1 tablet (200 mg total) by mouth 2 (two) times daily.  Dispense: 180 tablet; Refill: 1 - NIFEdipine  (ADALAT  CC) 90 MG 24 hr tablet; Take 1 tablet (90 mg total) by mouth daily.  Dispense: 90 tablet; Refill: 1  2. Screening for diabetes mellitus (DM) -A1c is normal - POCT glycosylated hemoglobin (Hb A1C)  3. Caregiver stress She is the major caregiver for her mother who is chronically ill Currently in the process of obtaining an  aide for her mother  4. Other iron  deficiency anemia Hemoglobin of 11.2 Currently on ferrous sulfate -advised to decrease from twice daily to daily dosing by hematology  5.  Multinodular thyroid  I had referred her to endocrinology and also for FNA of her thyroid  nodule. Pathology had revealed benign follicular nodule   6 Recurrent pregnancy losses She does not meet criteria for antiphospholipid syndrome per Hematology Continue to follow-up with GYN Regular exercise to aid in weight loss, optimizing her medical conditions in preparation for conception.  Blood pressure is normal, she does not have diabetes.  Continue multivitamin.  Meds ordered this encounter  Medications   labetalol  (NORMODYNE ) 200 MG tablet    Sig: Take 1 tablet (200 mg total) by mouth 2 (two) times daily.    Dispense:  180 tablet    Refill:  1   NIFEdipine  (ADALAT  CC) 90 MG 24 hr tablet    Sig: Take 1 tablet (90 mg total) by mouth daily.    Dispense:  90 tablet    Refill:  1    Follow-up: Return in about 6 months (around 09/04/2024) for Chronic medical conditions.       Joaquin Mulberry, MD, FAAFP. [facility] [place], [place] [phone]   03/05/2024, 12:01 PM

## 2024-03-05 NOTE — Patient Instructions (Signed)
 VISIT SUMMARY:  Today, we reviewed your blood pressure and iron  levels. Your blood pressure has improved with your current medications, and your iron  levels are stable with the adjusted dosage. We also discussed your stress from caregiving responsibilities and ways to manage it. Additionally, we talked about your general health maintenance, including the importance of regular exercise and self-care.  YOUR PLAN:  -ANTIPHOSPHOLIPID SYNDROME (APS): APS is a condition that can cause blood clots and pregnancy complications. Continue taking aspirin  as recommended by your hematologist and follow up with your OBGYN for pregnancy management and monitoring of APS.  -HYPERTENSION: Hypertension is high blood pressure. Your blood pressure has improved with labetalol  and nifedipine . Continue taking labetalol  200 mg twice daily and nifedipine  90 mg once daily. Also, continue taking aspirin  as prescribed. Please try to monitor your blood pressure at home regularly and notify us  if your readings are elevated.  -IRON  DEFICIENCY ANEMIA: Iron  deficiency anemia is a condition where your body lacks enough iron  to produce healthy red blood cells. Continue taking ferrous sulfate  325 mg once daily as recommended. If your hemoglobin levels remain low, we may consider IV iron  therapy. Follow up with your OBGYN for anemia management during pregnancy.  -CAREGIVER STRESS: Caregiver stress is the physical and emotional strain of caregiving. Discuss aid assistance with your mother's primary care provider. Take breaks and focus on self-care, including exercise and mental health.  -GENERAL HEALTH MAINTENANCE: You are not engaging in regular exercise and have been neglecting your personal health due to caregiving. Regular exercise, such as walking, can improve your physical and mental health. Continue taking prenatal vitamins with folic acid in preparation for pregnancy.  INSTRUCTIONS:  Please schedule a follow-up appointment in six  months. We will perform an A1c test to screen for diabetes at that time. Ensure you have adequate medication refills and report any elevated blood pressure readings at home.

## 2024-03-11 NOTE — Telephone Encounter (Signed)
 Can we re open her endocrin referral or does a new one need to be sent.

## 2024-03-14 ENCOUNTER — Other Ambulatory Visit: Payer: Self-pay

## 2024-03-18 ENCOUNTER — Other Ambulatory Visit: Payer: Self-pay

## 2024-03-29 ENCOUNTER — Other Ambulatory Visit: Payer: Self-pay

## 2024-03-29 ENCOUNTER — Encounter: Payer: Self-pay | Admitting: Pharmacist

## 2024-03-29 ENCOUNTER — Other Ambulatory Visit (HOSPITAL_COMMUNITY): Payer: Self-pay

## 2024-03-30 DIAGNOSIS — Z419 Encounter for procedure for purposes other than remedying health state, unspecified: Secondary | ICD-10-CM | POA: Diagnosis not present

## 2024-04-08 ENCOUNTER — Other Ambulatory Visit: Payer: Self-pay

## 2024-04-08 ENCOUNTER — Other Ambulatory Visit (HOSPITAL_COMMUNITY): Payer: Self-pay

## 2024-04-09 ENCOUNTER — Other Ambulatory Visit: Payer: Self-pay

## 2024-04-30 DIAGNOSIS — Z419 Encounter for procedure for purposes other than remedying health state, unspecified: Secondary | ICD-10-CM | POA: Diagnosis not present

## 2024-05-17 ENCOUNTER — Emergency Department (HOSPITAL_COMMUNITY)
Admission: EM | Admit: 2024-05-17 | Discharge: 2024-05-18 | Disposition: A | Discharge status: Home / Self Care | Attending: Emergency Medicine | Admitting: Emergency Medicine

## 2024-05-17 ENCOUNTER — Other Ambulatory Visit: Payer: Self-pay

## 2024-05-17 DIAGNOSIS — Z91040 Latex allergy status: Secondary | ICD-10-CM | POA: Diagnosis not present

## 2024-05-17 DIAGNOSIS — N611 Abscess of the breast and nipple: Secondary | ICD-10-CM | POA: Insufficient documentation

## 2024-05-17 DIAGNOSIS — I10 Essential (primary) hypertension: Secondary | ICD-10-CM | POA: Insufficient documentation

## 2024-05-17 DIAGNOSIS — Z7982 Long term (current) use of aspirin: Secondary | ICD-10-CM | POA: Insufficient documentation

## 2024-05-17 NOTE — ED Triage Notes (Signed)
 Pt has had abscess on her right breast x 1 week. Pt denies drainage,  but does report that area is red and hard.

## 2024-05-18 MED ORDER — DOXYCYCLINE HYCLATE 100 MG PO CAPS: 100.0000 mg | ORAL_CAPSULE | Freq: Two times a day (BID) | ORAL | 0 refills | Status: DC

## 2024-05-18 MED ORDER — LIDOCAINE-EPINEPHRINE (PF) 2 %-1:200000 IJ SOLN
10.0000 mL | Freq: Once | INTRAMUSCULAR | Status: AC
  Administered 2024-05-18: 10 mL
  Filled 2024-05-18: qty 20

## 2024-05-18 MED ORDER — LIDOCAINE-EPINEPHRINE-TETRACAINE (LET) TOPICAL GEL
3.0000 mL | Freq: Once | TOPICAL | Status: AC
  Administered 2024-05-18: 3 mL via TOPICAL
  Filled 2024-05-18: qty 3

## 2024-05-18 NOTE — ED Notes (Signed)
 Pt complaining about the wait time and asking for a number to report to. Pt states she has been here all night and we are putting other people in front of her. Pt explained that we do not operate on wait time but on acuity. Pt stated I don't give a fuck. Pt given the patient experience number.

## 2024-05-18 NOTE — Discharge Instructions (Signed)
 It was a pleasure taking part in your care.  As discussed, please follow-up with Dr. Ebbie of general surgery.  Call on Tuesday and make an appointment.  Take ibuprofen  or Tylenol  at home for pain.  Please take doxycycline  twice a day for 7 days.  This is an antibiotic.  This will increase your risk of sunburn.  Please return to the ED with any new symptoms.

## 2024-05-18 NOTE — ED Provider Notes (Signed)
 [facility] Provider Note   CSN: [id number] Arrival date & time: 05/17/24  2029     Patient presents with: Abscess   Ashlee Fox is a 38 y.o. female with history of hidradenitis suppurativa, hypertension.  Patient presents to ED for evaluation of breast abscess.  States that for the last 1 week she has a progressively worsening breast abscess to her right breast around her areola.  She denies breast-feeding or recently being pregnant.  She reports objective fevers at home associated with nausea but no vomiting.  Denies abdominal pain or diarrhea.  Denies any drainage from the site.  Reports history of hidradenitis suppurativa and states typically gets abscesses in her axilla area but denies any history of breast abscess.   Abscess      Prior to Admission medications   Medication Sig Start Date End Date Taking? Authorizing Provider  doxycycline  (VIBRAMYCIN ) 100 MG capsule Take 1 capsule (100 mg total) by mouth 2 (two) times daily. 05/18/24  Yes Ruthell Lonni FALCON, PA-C  aspirin  EC 81 MG tablet Take 2 tablets (162 mg total) by mouth daily. Swallow whole. 02/13/24   Cleatus Moccasin, MD  cholecalciferol (VITAMIN D) 1000 units tablet Take 1 capsule by mouth daily.    [provider]  Ferrous Sulfate  (IRON ) 325 (65 Fe) MG TABS Take 1 tablet by mouth twice daily 01/29/24   Newlin, Enobong, MD  labetalol  (NORMODYNE ) 200 MG tablet Take 1 tablet (200 mg total) by mouth 2 (two) times daily. 03/05/24   Newlin, Enobong, MD  Multiple Vitamin (MULTIVITAMIN WITH MINERALS) TABS Take 1 tablet by mouth daily.    [provider]  naproxen  (NAPROSYN ) 500 MG tablet Take 1 tablet (500 mg total) by mouth 2 (two) times daily. 02/19/24   Logan Ubaldo NOVAK, PA-C  NIFEdipine  (ADALAT  CC) 90 MG 24 hr tablet Take 1 tablet (90 mg total) by mouth daily. 03/05/24   Newlin, Enobong, MD    Allergies: Amoxicillin, Pineapple, and Latex    Review of Systems   Skin:        Breast abscess  All other systems reviewed and are negative.   Updated Vital Signs BP 129/70   Pulse 81   Temp 98.3 F (36.8 C)   Resp 18   SpO2 99%   Physical Exam Vitals and nursing note reviewed.  Constitutional:      General: She is not in acute distress.    Appearance: She is well-developed.  HENT:     Head: Normocephalic and atraumatic.  Eyes:     Conjunctiva/sclera: Conjunctivae normal.  Cardiovascular:     Rate and Rhythm: Normal rate and regular rhythm.     Heart sounds: No murmur heard. Pulmonary:     Effort: Pulmonary effort is normal. No respiratory distress.     Breath sounds: Normal breath sounds.  Chest:       Comments: Induration, no fluctuance to above circumscribed area Abdominal:     Palpations: Abdomen is soft.     Tenderness: There is no abdominal tenderness.  Musculoskeletal:        General: No swelling.     Cervical back: Neck supple.  Skin:    General: Skin is warm and dry.     Capillary Refill: Capillary refill takes less than 2 seconds.  Neurological:     Mental Status: She is alert and oriented to person, place, and time. Mental status is at baseline.  Psychiatric:  Mood and Affect: Mood normal.     (all labs ordered are listed, but only abnormal results are displayed) Labs Reviewed  AEROBIC CULTURE W GRAM STAIN (SUPERFICIAL SPECIMEN)    EKG: None  Radiology: No results found.  .Incision and Drainage  Date/Time: 05/18/2024 4:10 AM  Performed by: Ruthell Lonni FALCON, PA-C Authorized by: Ruthell Lonni FALCON, PA-C   Consent:    Consent obtained:  Verbal   Consent given by:  Patient   Risks, benefits, and alternatives were discussed: yes     Risks discussed:  Damage to other organs, bleeding, incomplete drainage and infection   Alternatives discussed:  No treatment Universal protocol:    Patient identity confirmed:  Verbally with patient Location:    Type:  Abscess   Size:  5x6   Location: Right  breast. Pre-procedure details:    Skin preparation:  Povidone-iodine Anesthesia:    Anesthesia method:  Local infiltration and topical application   Topical anesthetic:  LET   Local anesthetic:  Lidocaine  2% WITH epi Procedure details:    Ultrasound guidance: yes     Needle aspiration: yes     Needle size:  18 G   Incision types:  Single straight   Drainage:  Purulent   Drainage amount:  Moderate Post-procedure details:    Procedure completion:  Tolerated well, no immediate complications    Medications Ordered in the ED  lidocaine -EPINEPHrine  (XYLOCAINE  W/EPI) 2 %-1:200000 (PF) injection 10 mL (10 mLs Other Given 05/18/24 0254)  lidocaine -EPINEPHrine -tetracaine  (LET) topical gel (3 mLs Topical Given 05/18/24 0254)     Medical Decision Making Risk Prescription drug management.   This is a 38 year old female presenting to the ED out of concern of abscess to her right breast.  Reports onset 7 days ago.  Reports objective fevers at home associated with nausea.  On exam, she is hemodynamically stable.  No tachycardia, afebrile.  Lung sounds are clear bilaterally, no hypoxia.  Abdomen soft and compressible.  Right breast with induration no obvious fluctuance to 11 o'clock position of breast just superior to areola.  Ultrasound imaging utilized and shows pocket of fluid around this area.  Patient had let gel applied, 2% lidocaine  with epinephrine  was administered and the patient had 17 mL of purulent fluid pulled off.  This was cultured.  At this time patient will be discharged home with referral to Dr. Ebbie with general surgery as well as doxycycline  which she will take twice daily for 7 days.  She was advised to return to the ED with any new symptoms.  Stable for discharge.    Final diagnoses:  Breast abscess    ED Discharge Orders          Ordered    doxycycline  (VIBRAMYCIN ) 100 MG capsule  2 times daily        05/18/24 0414               Ruthell Lonni FALCON,  PA-C 05/18/24 0416    Raford Lenis, MD 05/18/24 [phone]

## 2024-05-24 LAB — AEROBIC CULTURE W GRAM STAIN (SUPERFICIAL SPECIMEN)

## 2024-05-25 ENCOUNTER — Telehealth (HOSPITAL_BASED_OUTPATIENT_CLINIC_OR_DEPARTMENT_OTHER): Payer: Self-pay | Admitting: *Deleted

## 2024-05-25 NOTE — Telephone Encounter (Signed)
 Post ED Visit - Positive Culture Follow-up: Unsuccessful Patient Follow-up  Culture assessed and recommendations reviewed by:  [x]  Koren Or, Pharm.D. []  Venetia Gully, Pharm.D., BCPS AQ-ID []  Garrel Crews, Pharm.D., BCPS []  Almarie Lunger, Pharm.D., BCPS []  [place], [address].D., BCPS, AAHIVP []  Rosaline Bihari, Pharm.D., BCPS, AAHIVP []  Massie Rigg, PharmD []  Jodie Rower, PharmD, BCPS  Positive aerobic culture  Plan:  Call for symptom check.  IF not better- Stop Doxycycline  and Start Cefadroxil 1000mg  BID x 7 days per Mliss Boyers, MD  Unable to contact patient, letter will be sent to address on file  Ashlee Fox 05/25/2024, 11:45 AM

## 2024-05-28 ENCOUNTER — Encounter: Payer: Self-pay | Admitting: Obstetrics and Gynecology

## 2024-05-28 ENCOUNTER — Encounter: Payer: Self-pay | Admitting: *Deleted

## 2024-05-31 DIAGNOSIS — Z419 Encounter for procedure for purposes other than remedying health state, unspecified: Secondary | ICD-10-CM | POA: Diagnosis not present

## 2024-06-07 ENCOUNTER — Other Ambulatory Visit: Payer: Self-pay | Admitting: General Surgery

## 2024-06-07 ENCOUNTER — Other Ambulatory Visit: Payer: Self-pay

## 2024-06-07 DIAGNOSIS — N611 Abscess of the breast and nipple: Secondary | ICD-10-CM | POA: Diagnosis not present

## 2024-06-07 MED ORDER — DOXYCYCLINE HYCLATE 100 MG PO CAPS
100.0000 mg | ORAL_CAPSULE | Freq: Two times a day (BID) | ORAL | 0 refills | Status: DC
  Filled 2024-06-07: qty 20, 10d supply, fill #0

## 2024-06-10 ENCOUNTER — Other Ambulatory Visit: Payer: Self-pay

## 2024-06-10 ENCOUNTER — Other Ambulatory Visit: Payer: Self-pay | Admitting: General Surgery

## 2024-06-10 DIAGNOSIS — N611 Abscess of the breast and nipple: Secondary | ICD-10-CM

## 2024-06-12 ENCOUNTER — Other Ambulatory Visit: Payer: Self-pay | Admitting: General Surgery

## 2024-06-12 ENCOUNTER — Ambulatory Visit
Admission: RE | Admit: 2024-06-12 | Discharge: 2024-06-12 | Disposition: A | Discharge status: Home / Self Care | Source: Ambulatory Visit | Attending: General Surgery | Admitting: General Surgery

## 2024-06-12 DIAGNOSIS — N611 Abscess of the breast and nipple: Secondary | ICD-10-CM

## 2024-06-12 DIAGNOSIS — N6489 Other specified disorders of breast: Secondary | ICD-10-CM | POA: Diagnosis not present

## 2024-06-19 ENCOUNTER — Other Ambulatory Visit: Payer: Self-pay | Admitting: General Surgery

## 2024-06-19 ENCOUNTER — Ambulatory Visit
Admission: RE | Admit: 2024-06-19 | Discharge: 2024-06-19 | Disposition: A | Discharge status: Home / Self Care | Source: Ambulatory Visit | Attending: General Surgery | Admitting: General Surgery

## 2024-06-19 ENCOUNTER — Ambulatory Visit
Admission: RE | Admit: 2024-06-19 | Discharge: 2024-06-19 | Disposition: A | Source: Ambulatory Visit | Attending: General Surgery | Admitting: General Surgery

## 2024-06-19 ENCOUNTER — Other Ambulatory Visit: Payer: Self-pay

## 2024-06-19 DIAGNOSIS — N611 Abscess of the breast and nipple: Secondary | ICD-10-CM | POA: Diagnosis not present

## 2024-06-19 DIAGNOSIS — N6310 Unspecified lump in the right breast, unspecified quadrant: Secondary | ICD-10-CM | POA: Diagnosis not present

## 2024-06-19 MED ORDER — DOXYCYCLINE HYCLATE 100 MG PO CAPS
100.0000 mg | ORAL_CAPSULE | Freq: Two times a day (BID) | ORAL | 0 refills | Status: AC
Start: 2024-06-13 — End: ?
  Filled 2024-06-19: qty 20, 10d supply, fill #0

## 2024-06-26 ENCOUNTER — Other Ambulatory Visit (HOSPITAL_COMMUNITY): Payer: Self-pay

## 2024-06-26 ENCOUNTER — Ambulatory Visit
Admission: RE | Admit: 2024-06-26 | Discharge: 2024-06-26 | Disposition: A | Discharge status: Home / Self Care | Source: Ambulatory Visit | Attending: General Surgery | Admitting: General Surgery

## 2024-06-26 ENCOUNTER — Other Ambulatory Visit: Payer: Self-pay

## 2024-06-26 ENCOUNTER — Other Ambulatory Visit: Payer: Self-pay | Admitting: General Surgery

## 2024-06-26 DIAGNOSIS — N611 Abscess of the breast and nipple: Secondary | ICD-10-CM

## 2024-06-26 DIAGNOSIS — N6310 Unspecified lump in the right breast, unspecified quadrant: Secondary | ICD-10-CM | POA: Diagnosis not present

## 2024-07-04 ENCOUNTER — Ambulatory Visit
Admission: RE | Admit: 2024-07-04 | Discharge: 2024-07-04 | Disposition: A | Discharge status: Home / Self Care | Source: Ambulatory Visit | Attending: General Surgery | Admitting: General Surgery

## 2024-07-04 DIAGNOSIS — N611 Abscess of the breast and nipple: Secondary | ICD-10-CM | POA: Diagnosis not present

## 2024-07-19 DIAGNOSIS — N611 Abscess of the breast and nipple: Secondary | ICD-10-CM | POA: Diagnosis not present

## 2024-07-22 ENCOUNTER — Other Ambulatory Visit: Payer: Self-pay

## 2024-07-25 ENCOUNTER — Other Ambulatory Visit: Payer: Self-pay

## 2024-07-31 DIAGNOSIS — Z419 Encounter for procedure for purposes other than remedying health state, unspecified: Secondary | ICD-10-CM | POA: Diagnosis not present

## 2024-08-06 ENCOUNTER — Other Ambulatory Visit (HOSPITAL_COMMUNITY): Payer: Self-pay

## 2024-09-04 ENCOUNTER — Ambulatory Visit: Attending: Family Medicine | Admitting: Family Medicine

## 2024-09-04 ENCOUNTER — Other Ambulatory Visit: Payer: Self-pay

## 2024-09-04 ENCOUNTER — Encounter: Payer: Self-pay | Admitting: Family Medicine

## 2024-09-04 VITALS — BP 128/75 | HR 89 | Temp 98.6°F | Ht 70.0 in | Wt 290.0 lb

## 2024-09-04 DIAGNOSIS — E042 Nontoxic multinodular goiter: Secondary | ICD-10-CM | POA: Diagnosis not present

## 2024-09-04 DIAGNOSIS — I10 Essential (primary) hypertension: Secondary | ICD-10-CM

## 2024-09-04 DIAGNOSIS — D508 Other iron deficiency anemias: Secondary | ICD-10-CM | POA: Diagnosis not present

## 2024-09-04 MED ORDER — NIFEDIPINE ER 90 MG PO TB24
90.0000 mg | ORAL_TABLET | Freq: Every day | ORAL | 1 refills | Status: AC
  Filled 2024-09-04 – 2024-09-20 (×3): qty 90, 90d supply, fill #0

## 2024-09-04 MED ORDER — LABETALOL HCL 200 MG PO TABS
200.0000 mg | ORAL_TABLET | Freq: Two times a day (BID) | ORAL | 1 refills | Status: AC
  Filled 2024-09-04: qty 180, 90d supply, fill #0

## 2024-09-04 NOTE — Progress Notes (Signed)
 Subjective:  Patient ID: Ashlee Fox, female    DOB: May 30, 1986  Age: 38 y.o. MRN: [id number]  CC: Medical Management of Chronic Issues     Discussed the use of AI scribe software for clinical note transcription with the patient, who gave verbal consent to proceed.  History of Present Illness Ashlee Fox is a 38 year old female with hypertension and anemia who presents for a follow-up visit.  She is taking nifedipine  90 mg once daily and labetalol  200 mg twice daily with good blood pressure control. She is trying to conceive and understands these medications are considered safe in pregnancy.  She recently completed a course of doxycycline  for a breast abscess, which improved without surgical intervention.  She follows a pescatarian diet and takes ferrous sulfate  once daily. This has improved prior constipation. Her hemoglobin was 11.2 in June 2025, and she wants her iron  levels monitored.  She has significant caregiving responsibilities for her mother with mobility issues and has home physical therapy and a hospital bed in place. She works part-time as an Biomedical scientist and is looking for more stable employment.  She has a thyroid  nodule and has not yet seen endocrinology even though she was referred as she states the information was sent to the [facility] office and she would rather be seen in [place].    Past Medical History:  Diagnosis Date   Anemia    Complication of anesthesia    Hx MRSA infection    Hypertension    Morbid obesity (HCC) 10/27/2017    Past Surgical History:  Procedure Laterality Date   CARPAL TUNNEL RELEASE Right 12/15/2021   Procedure: RIGHT CARPAL TUNNEL RELEASE;  Surgeon: Romona Harari, MD;  Location: [facility];  Service: Orthopedics;  Laterality: Right;   TONSILLECTOMY     ULNAR NERVE TRANSPOSITION Right 12/15/2021   Procedure: RIGHT ULNAR NERVE DECOMPRESSION/TRANSPOSITION;  Surgeon: Romona Harari, MD;  Location: [facility];  Service: Orthopedics;  Laterality: Right;    Family History  Problem Relation Age of Onset   Diabetes Maternal Grandmother    Hypertension Maternal Grandmother    Breast cancer Maternal Uncle    Diabetes Maternal Uncle     Social History   Socioeconomic History   Marital status: Single    Spouse name: Not on file   Number of children: 0   Years of education: Not on file   Highest education level: Associate degree: occupational, scientist, product/process development, or vocational program  Occupational History   Not on file  Tobacco Use   Smoking status: Never   Smokeless tobacco: Never  Vaping Use   Vaping status: Never Used  Substance and Sexual Activity   Alcohol use: No   Drug use: Yes    Types: Marijuana    Comment: 'avid user'   Sexual activity: Yes    Birth control/protection: None  Other Topics Concern   Not on file  Social History Narrative   Not on file   Social Drivers of Health   Tobacco Use: Low Risk  (07/19/2024)   Received from [facility]   Patient History    Smoking Tobacco Use: Never    Smokeless Tobacco Use: Never    Passive Exposure: Not on file  Financial Resource Strain: Low Risk (09/04/2024)   Overall Financial Resource Strain (CARDIA)    Difficulty of Paying Living Expenses: Not very hard  Food Insecurity: Patient Declined (09/04/2024)   Epic    Worried About Running  Out of Food in the Last Year: Patient declined    Ran Out of Food in the Last Year: Patient declined  Transportation Needs: No Transportation Needs (09/04/2024)   Epic    Lack of Transportation (Medical): No    Lack of Transportation (Non-Medical): No  Physical Activity: Unknown (09/04/2024)   Exercise Vital Sign    Days of Exercise per Week: Patient declined    Minutes of Exercise per Session: Not on file  Stress: No Stress Concern Present (09/04/2024)   [employer] of Occupational Health - Occupational Stress Questionnaire    Feeling of Stress: Only  a little  Social Connections: Unknown (09/04/2024)   Social Connection and Isolation Panel    Frequency of Communication with Friends and Family: More than three times a week    Frequency of Social Gatherings with Friends and Family: More than three times a week    Attends Religious Services: Patient declined    Active Member of Clubs or Organizations: No    Attends Banker Meetings: Not on file    Marital Status: Patient declined  Depression (PHQ2-9): Medium Risk (03/05/2024)   Depression (PHQ2-9)    PHQ-2 Score: 5  Alcohol Screen: Low Risk (09/04/2024)   Alcohol Screen    Last Alcohol Screening Score (AUDIT): 1  Housing: Low Risk (09/04/2024)   Epic    Unable to Pay for Housing in the Last Year: No    Number of Times Moved in the Last Year: 0    Homeless in the Last Year: No  Utilities: Not At Risk (02/08/2024)   [facility] Utilities    Threatened with loss of utilities: No  Health Literacy: Not on file    Allergies[1]  Outpatient Medications Prior to Visit  Medication Sig Dispense Refill   aspirin  EC 81 MG tablet Take 2 tablets (162 mg total) by mouth daily. Swallow whole. 120 tablet 5   cholecalciferol (VITAMIN D) 1000 units tablet Take 1 capsule by mouth daily.     Ferrous Sulfate  (IRON ) 325 (65 Fe) MG TABS Take 1 tablet by mouth twice daily 180 tablet 1   Multiple Vitamin (MULTIVITAMIN WITH MINERALS) TABS Take 1 tablet by mouth daily.     labetalol  (NORMODYNE ) 200 MG tablet Take 1 tablet (200 mg total) by mouth 2 (two) times daily. 180 tablet 1   NIFEdipine  (ADALAT  CC) 90 MG 24 hr tablet Take 1 tablet (90 mg total) by mouth daily. 90 tablet 1   doxycycline  (VIBRAMYCIN ) 100 MG capsule Take 1 capsule (100 mg total) by mouth 2 (two) times daily. (Patient not taking: Reported on 09/04/2024) 14 capsule 0   doxycycline  (VIBRAMYCIN ) 100 MG capsule Take 1 capsule (100 mg total) by mouth 2 (two) times daily FOR 10 DAYS. (Patient not taking: Reported on 09/04/2024) 20 capsule 0    naproxen  (NAPROSYN ) 500 MG tablet Take 1 tablet (500 mg total) by mouth 2 (two) times daily. (Patient not taking: Reported on 09/04/2024) 30 tablet 0   No facility-administered medications prior to visit.     ROS Review of Systems  Constitutional:  Negative for activity change and appetite change.  HENT:  Negative for sinus pressure and sore throat.   Respiratory:  Negative for chest tightness, shortness of breath and wheezing.   Cardiovascular:  Negative for chest pain and palpitations.  Gastrointestinal:  Negative for abdominal distention, abdominal pain and constipation.  Genitourinary: Negative.   Musculoskeletal: Negative.   Psychiatric/Behavioral:  Negative for behavioral problems and dysphoric mood.  Objective:  BP 128/75   Pulse 89   Temp 98.6 F (37 C) (Oral)   Ht 5' 10 (1.778 m)   Wt 290 lb (131.5 kg)   SpO2 99%   BMI 41.61 kg/m      09/04/2024   10:40 AM 05/18/2024    4:23 AM 05/18/2024    4:04 AM  BP/Weight  Systolic BP 128 128 129  Diastolic BP 75 68 70  Wt. (Lbs) 290    BMI 41.61 kg/m2        Physical Exam Constitutional:      Appearance: She is well-developed.  Cardiovascular:     Rate and Rhythm: Normal rate.     Heart sounds: Normal heart sounds. No murmur heard. Pulmonary:     Effort: Pulmonary effort is normal.     Breath sounds: Normal breath sounds. No wheezing or rales.  Chest:     Chest wall: No tenderness.  Abdominal:     General: Bowel sounds are normal. There is no distension.     Palpations: Abdomen is soft. There is no mass.     Tenderness: There is no abdominal tenderness.  Musculoskeletal:        General: Normal range of motion.     Right lower leg: No edema.     Left lower leg: No edema.  Neurological:     Mental Status: She is alert and oriented to person, place, and time.  Psychiatric:        Mood and Affect: Mood normal.        Latest Ref Rng & Units 02/19/2024    2:14 AM 02/08/2024    2:33 PM 01/03/2024    11:23 AM  CMP  Glucose 70 - 99 mg/dL 889  92  81   BUN 6 - 20 mg/dL 13  11  8    Creatinine 0.44 - 1.00 mg/dL 9.30  9.38  9.30   Sodium 135 - 145 mmol/L 137  140  141   Potassium 3.5 - 5.1 mmol/L 3.6  3.6  3.8   Chloride 98 - 111 mmol/L 104  106  104   CO2 22 - 32 mmol/L 23  28  21    Calcium 8.9 - 10.3 mg/dL 8.9  8.7  9.1   Total Protein 6.5 - 8.1 g/dL 7.2  7.6  7.4   Total Bilirubin 0.0 - 1.2 mg/dL 0.3  0.2  0.5   Alkaline Phos 38 - 126 U/L 53  58  69   AST 15 - 41 U/L 14  13  14    ALT 0 - 44 U/L 24  19  16      Lipid Panel     Component Value Date/Time   CHOL 146 01/03/2024 1123   TRIG 84 01/03/2024 1123   HDL 37 (L) 01/03/2024 1123   CHOLHDL 4.2 04/28/2021 0851   LDLCALC 93 01/03/2024 1123    CBC    Component Value Date/Time   WBC 10.9 (H) 02/19/2024 0214   RBC 3.50 (L) 02/19/2024 0214   HGB 11.2 (L) 02/19/2024 0214   HGB 10.6 (L) 02/08/2024 1433   HGB 12.9 01/03/2024 1123   HCT 35.0 (L) 02/19/2024 0214   HCT 38.3 01/03/2024 1123   PLT 279 02/19/2024 0214   PLT 251 02/08/2024 1433   PLT 275 01/03/2024 1123   MCV 100.0 02/19/2024 0214   MCV 94 01/03/2024 1123   MCH 32.0 02/19/2024 0214   MCHC 32.0 02/19/2024 0214   RDW 13.0 02/19/2024 0214  RDW 11.5 (L) 01/03/2024 1123   LYMPHSABS 2.1 02/08/2024 1433   LYMPHSABS 2.4 01/03/2024 1123   MONOABS 0.7 02/08/2024 1433   EOSABS 0.2 02/08/2024 1433   EOSABS 0.2 01/03/2024 1123   BASOSABS 0.0 02/08/2024 1433   BASOSABS 0.0 01/03/2024 1123    Lab Results  Component Value Date   HGBA1C 4.6 03/05/2024       Assessment & Plan Essential hypertension Blood pressure controlled with nifedipine  and labetalol , both safe for pregnancy. - Continue nifedipine  90 mg oral daily. - Continue labetalol  200 mg oral twice daily. -Counseled on blood pressure goal of less than 130/80, low-sodium, DASH diet, medication compliance, 150 minutes of moderate intensity exercise per week. Discussed medication compliance, adverse  effects.   Multinodular thyroid  Referral to endocrinology not completed. Interested in removal options. - Provided contact information for [facility] endocrinology. - Instructed her to contact endocrinology for appointment scheduling.  Iron  deficiency anemia Taking ferrous sulfate  325 mg daily. Constipation resolved with dosage adjustment. Last hemoglobin 11.2 in June. - Ordered hemoglobin test. - Continue ferrous sulfate  325 mg oral once daily.  General health maintenance Declined flu shot. Last diabetes screening negative in June. - Ordered basic metabolic panel. - Ordered iron  level test.       Meds ordered this encounter  Medications   labetalol  (NORMODYNE ) 200 MG tablet    Sig: Take 1 tablet (200 mg total) by mouth 2 (two) times daily.    Dispense:  180 tablet    Refill:  1   NIFEdipine  (ADALAT  CC) 90 MG 24 hr tablet    Sig: Take 1 tablet (90 mg total) by mouth daily.    Dispense:  90 tablet    Refill:  1    Follow-up: Return in about 6 months (around 03/05/2025) for Chronic medical conditions.       Corrina Sabin, MD, FAAFP. [facility] [place], [place] [phone]   09/04/2024, 12:57 PM     [1]  Allergies Allergen Reactions   Amoxicillin Anaphylaxis   Pineapple Itching   Latex Rash

## 2024-09-04 NOTE — Patient Instructions (Signed)
 Sent Referral to [facility] - [facility] [phone] [address] [place] [address] [phone]

## 2024-09-05 ENCOUNTER — Ambulatory Visit: Payer: Self-pay | Admitting: Family Medicine

## 2024-09-05 LAB — CBC WITH DIFFERENTIAL/PLATELET
Basophils Absolute: 0 x10E3/uL (ref 0.0–0.2)
Basos: 1 %
EOS (ABSOLUTE): 0.1 x10E3/uL (ref 0.0–0.4)
Eos: 2 %
Hematocrit: 37.6 % (ref 34.0–46.6)
Hemoglobin: 12.2 g/dL (ref 11.1–15.9)
Immature Grans (Abs): 0 x10E3/uL (ref 0.0–0.1)
Immature Granulocytes: 0 %
Lymphocytes Absolute: 1.7 x10E3/uL (ref 0.7–3.1)
Lymphs: 32 %
MCH: 25.1 pg — ABNORMAL LOW (ref 26.6–33.0)
MCHC: 32.4 g/dL (ref 31.5–35.7)
MCV: 77 fL — ABNORMAL LOW (ref 79–97)
Monocytes Absolute: 0.4 x10E3/uL (ref 0.1–0.9)
Monocytes: 7 %
Neutrophils Absolute: 3.2 x10E3/uL (ref 1.4–7.0)
Neutrophils: 58 %
Platelets: 198 x10E3/uL (ref 150–450)
RBC: 4.86 x10E6/uL (ref 3.77–5.28)
RDW: 13.8 % (ref 11.7–15.4)
WBC: 5.4 x10E3/uL (ref 3.4–10.8)

## 2024-09-05 LAB — BASIC METABOLIC PANEL WITH GFR
BUN/Creatinine Ratio: 13 (ref 9–23)
BUN: 9 mg/dL (ref 6–20)
CO2: 22 mmol/L (ref 20–29)
Calcium: 9 mg/dL (ref 8.7–10.2)
Chloride: 105 mmol/L (ref 96–106)
Creatinine, Ser: 0.67 mg/dL (ref 0.57–1.00)
Glucose: 101 mg/dL — ABNORMAL HIGH (ref 70–99)
Potassium: 3.8 mmol/L (ref 3.5–5.2)
Sodium: 139 mmol/L (ref 134–144)
eGFR: 114 mL/min/1.73 (ref >59)

## 2024-09-20 ENCOUNTER — Other Ambulatory Visit: Payer: Self-pay

## 2024-10-04 ENCOUNTER — Other Ambulatory Visit (HOSPITAL_COMMUNITY): Payer: Self-pay

## 2024-10-23 ENCOUNTER — Other Ambulatory Visit: Payer: Self-pay | Admitting: Family Medicine

## 2025-03-06 ENCOUNTER — Ambulatory Visit: Payer: Self-pay | Admitting: Family Medicine
